# Patient Record
Sex: Male | Born: 1955
Health system: Southern US, Community
[De-identification: ages and names within clinical notes are randomized; demographics above are authoritative.]

## PROBLEM LIST (undated history)

## (undated) ENCOUNTER — Ambulatory Visit: Discharge status: Absent without leave | Payer: BC Managed Care – PPO

## (undated) DIAGNOSIS — G709 Myoneural disorder, unspecified: Secondary | ICD-10-CM

## (undated) DIAGNOSIS — G4730 Sleep apnea, unspecified: Secondary | ICD-10-CM

## (undated) DIAGNOSIS — M1990 Unspecified osteoarthritis, unspecified site: Secondary | ICD-10-CM

## (undated) DIAGNOSIS — K219 Gastro-esophageal reflux disease without esophagitis: Secondary | ICD-10-CM

## (undated) DIAGNOSIS — G4733 Obstructive sleep apnea (adult) (pediatric): Secondary | ICD-10-CM

## (undated) DIAGNOSIS — R000 Tachycardia, unspecified: Secondary | ICD-10-CM

## (undated) DIAGNOSIS — K5730 Diverticulosis of large intestine without perforation or abscess without bleeding: Secondary | ICD-10-CM

## (undated) DIAGNOSIS — E785 Hyperlipidemia, unspecified: Secondary | ICD-10-CM

## (undated) DIAGNOSIS — R251 Tremor, unspecified: Secondary | ICD-10-CM

## (undated) DIAGNOSIS — T7840XA Allergy, unspecified, initial encounter: Secondary | ICD-10-CM

## (undated) DIAGNOSIS — H269 Unspecified cataract: Secondary | ICD-10-CM

## (undated) DIAGNOSIS — F419 Anxiety disorder, unspecified: Secondary | ICD-10-CM

## (undated) DIAGNOSIS — J189 Pneumonia, unspecified organism: Secondary | ICD-10-CM

## (undated) DIAGNOSIS — J449 Chronic obstructive pulmonary disease, unspecified: Secondary | ICD-10-CM

## (undated) DIAGNOSIS — I10 Essential (primary) hypertension: Secondary | ICD-10-CM

## (undated) HISTORY — DX: Diverticulosis of large intestine without perforation or abscess without bleeding: K57.30

## (undated) HISTORY — DX: Obstructive sleep apnea (adult) (pediatric): G47.33

## (undated) HISTORY — DX: Tremor, unspecified: R25.1

## (undated) HISTORY — DX: Unspecified cataract: H26.9

## (undated) HISTORY — DX: Allergy, unspecified, initial encounter: T78.40XA

## (undated) HISTORY — DX: Tachycardia, unspecified: R00.0

## (undated) HISTORY — DX: Gastro-esophageal reflux disease without esophagitis: K21.9

## (undated) HISTORY — DX: Hyperlipidemia, unspecified: E78.5

## (undated) HISTORY — DX: Unspecified osteoarthritis, unspecified site: M19.90

## (undated) HISTORY — DX: Essential (primary) hypertension: I10

## (undated) HISTORY — DX: Sleep apnea, unspecified: G47.30

## (undated) HISTORY — DX: Pneumonia, unspecified organism: J18.9

## (undated) HISTORY — DX: Myoneural disorder, unspecified: G70.9

## (undated) HISTORY — PX: POLYPECTOMY: SHX149

## (undated) HISTORY — DX: Anxiety disorder, unspecified: F41.9

## (undated) HISTORY — DX: Chronic obstructive pulmonary disease, unspecified: J44.9

---

## 1981-02-08 HISTORY — PX: RHINOPLASTY: SUR1284

## 1986-02-08 DIAGNOSIS — J189 Pneumonia, unspecified organism: Secondary | ICD-10-CM

## 1986-02-08 HISTORY — DX: Pneumonia, unspecified organism: J18.9

## 1995-02-09 HISTORY — PX: NISSEN FUNDOPLICATION: SHX2091

## 2001-02-08 DIAGNOSIS — R000 Tachycardia, unspecified: Secondary | ICD-10-CM

## 2001-02-08 HISTORY — DX: Tachycardia, unspecified: R00.0

## 2001-06-22 ENCOUNTER — Observation Stay (HOSPITAL_COMMUNITY): Admission: EM | Admit: 2001-06-22 | Discharge: 2001-06-23 | Payer: Self-pay | Admitting: Internal Medicine

## 2002-08-26 ENCOUNTER — Encounter: Payer: Self-pay | Admitting: Pulmonary Disease

## 2002-08-26 ENCOUNTER — Ambulatory Visit (HOSPITAL_BASED_OUTPATIENT_CLINIC_OR_DEPARTMENT_OTHER): Admission: RE | Admit: 2002-08-26 | Discharge: 2002-08-26 | Payer: Self-pay | Admitting: Internal Medicine

## 2002-11-04 ENCOUNTER — Ambulatory Visit (HOSPITAL_BASED_OUTPATIENT_CLINIC_OR_DEPARTMENT_OTHER): Admission: RE | Admit: 2002-11-04 | Discharge: 2002-11-04 | Payer: Self-pay | Admitting: Pulmonary Disease

## 2002-11-04 ENCOUNTER — Encounter: Payer: Self-pay | Admitting: Pulmonary Disease

## 2003-04-15 ENCOUNTER — Encounter: Payer: Self-pay | Admitting: Internal Medicine

## 2003-07-10 HISTORY — PX: OTHER SURGICAL HISTORY: SHX169

## 2003-07-26 ENCOUNTER — Ambulatory Visit (HOSPITAL_BASED_OUTPATIENT_CLINIC_OR_DEPARTMENT_OTHER): Admission: RE | Admit: 2003-07-26 | Discharge: 2003-07-26 | Payer: Self-pay | Admitting: Plastic Surgery

## 2003-07-26 ENCOUNTER — Ambulatory Visit (HOSPITAL_COMMUNITY): Admission: RE | Admit: 2003-07-26 | Discharge: 2003-07-26 | Payer: Self-pay | Admitting: Plastic Surgery

## 2003-07-26 ENCOUNTER — Encounter (INDEPENDENT_AMBULATORY_CARE_PROVIDER_SITE_OTHER): Payer: Self-pay | Admitting: Specialist

## 2004-01-30 ENCOUNTER — Ambulatory Visit: Payer: Self-pay | Admitting: Family Medicine

## 2005-02-03 ENCOUNTER — Ambulatory Visit: Payer: Self-pay | Admitting: Internal Medicine

## 2005-03-01 ENCOUNTER — Ambulatory Visit: Payer: Self-pay | Admitting: Gastroenterology

## 2005-03-11 HISTORY — PX: OTHER SURGICAL HISTORY: SHX169

## 2005-03-11 HISTORY — PX: COLONOSCOPY: SHX174

## 2005-03-18 ENCOUNTER — Ambulatory Visit: Payer: Self-pay | Admitting: Gastroenterology

## 2005-04-07 ENCOUNTER — Ambulatory Visit: Payer: Self-pay | Admitting: Internal Medicine

## 2005-07-09 ENCOUNTER — Ambulatory Visit: Payer: Self-pay | Admitting: Internal Medicine

## 2006-02-23 ENCOUNTER — Ambulatory Visit: Payer: Self-pay | Admitting: Internal Medicine

## 2006-02-23 LAB — CONVERTED CEMR LAB
BUN: 8 mg/dL (ref 6–23)
Basophils Absolute: 0 10*3/uL (ref 0.0–0.1)
CO2: 29 meq/L (ref 19–32)
Calcium: 9.6 mg/dL (ref 8.4–10.5)
Chloride: 103 meq/L (ref 96–112)
Cholesterol: 150 mg/dL (ref 0–200)
Creatinine, Ser: 1.1 mg/dL (ref 0.4–1.5)
Eosinophils Relative: 1.9 % (ref 0.0–5.0)
HDL: 50.2 mg/dL (ref >39.0)
Hemoglobin: 16.4 g/dL (ref 13.0–17.0)
Lymphocytes Relative: 27.1 % (ref 12.0–46.0)
MCHC: 32.8 g/dL (ref 30.0–36.0)
Monocytes Relative: 11.5 % — ABNORMAL HIGH (ref 3.0–11.0)
Neutro Abs: 2.9 10*3/uL (ref 1.4–7.7)
Neutrophils Relative %: 58.5 % (ref 43.0–77.0)
PSA: 0.25 ng/mL (ref 0.10–4.00)
TSH: 1.76 microintl units/mL (ref 0.35–5.50)
Total CHOL/HDL Ratio: 3
Triglycerides: 73 mg/dL (ref 0–149)

## 2007-01-02 ENCOUNTER — Telehealth (INDEPENDENT_AMBULATORY_CARE_PROVIDER_SITE_OTHER): Payer: Self-pay | Admitting: *Deleted

## 2007-01-10 ENCOUNTER — Ambulatory Visit: Payer: Self-pay | Admitting: Internal Medicine

## 2007-01-10 LAB — CONVERTED CEMR LAB
Albumin: 4.3 g/dL (ref 3.5–5.2)
BUN: 10 mg/dL (ref 6–23)
Bilirubin, Direct: 0.1 mg/dL (ref 0.0–0.3)
HDL: 45.6 mg/dL (ref >39.0)
Hgb A1c MFr Bld: 5.7 % (ref 4.6–6.0)
PSA: 0.31 ng/mL (ref 0.10–4.00)
Total Bilirubin: 0.9 mg/dL (ref 0.3–1.2)
Total CHOL/HDL Ratio: 3.5
Triglycerides: 82 mg/dL (ref 0–149)
VLDL: 16 mg/dL (ref 0–40)

## 2007-01-17 ENCOUNTER — Ambulatory Visit: Payer: Self-pay | Admitting: Internal Medicine

## 2007-01-17 DIAGNOSIS — J309 Allergic rhinitis, unspecified: Secondary | ICD-10-CM | POA: Insufficient documentation

## 2007-01-17 DIAGNOSIS — E785 Hyperlipidemia, unspecified: Secondary | ICD-10-CM

## 2007-01-17 DIAGNOSIS — J45909 Unspecified asthma, uncomplicated: Secondary | ICD-10-CM | POA: Insufficient documentation

## 2007-01-17 LAB — CONVERTED CEMR LAB: HDL goal, serum: 40 mg/dL

## 2007-05-29 ENCOUNTER — Encounter: Payer: Self-pay | Admitting: Internal Medicine

## 2008-02-14 ENCOUNTER — Telehealth (INDEPENDENT_AMBULATORY_CARE_PROVIDER_SITE_OTHER): Payer: Self-pay | Admitting: *Deleted

## 2008-04-22 ENCOUNTER — Telehealth (INDEPENDENT_AMBULATORY_CARE_PROVIDER_SITE_OTHER): Payer: Self-pay | Admitting: *Deleted

## 2008-05-15 ENCOUNTER — Ambulatory Visit: Payer: Self-pay | Admitting: Internal Medicine

## 2008-05-15 DIAGNOSIS — L2089 Other atopic dermatitis: Secondary | ICD-10-CM | POA: Insufficient documentation

## 2008-05-15 DIAGNOSIS — K5730 Diverticulosis of large intestine without perforation or abscess without bleeding: Secondary | ICD-10-CM | POA: Insufficient documentation

## 2008-05-20 ENCOUNTER — Encounter (INDEPENDENT_AMBULATORY_CARE_PROVIDER_SITE_OTHER): Payer: Self-pay | Admitting: *Deleted

## 2008-05-27 ENCOUNTER — Encounter: Payer: Self-pay | Admitting: Internal Medicine

## 2009-01-17 ENCOUNTER — Encounter: Payer: Self-pay | Admitting: Internal Medicine

## 2009-05-23 ENCOUNTER — Telehealth (INDEPENDENT_AMBULATORY_CARE_PROVIDER_SITE_OTHER): Payer: Self-pay | Admitting: *Deleted

## 2009-07-18 ENCOUNTER — Encounter: Payer: Self-pay | Admitting: Internal Medicine

## 2009-10-18 ENCOUNTER — Emergency Department (HOSPITAL_COMMUNITY): Admission: EM | Admit: 2009-10-18 | Discharge: 2009-10-18 | Payer: Self-pay | Admitting: Emergency Medicine

## 2009-10-28 ENCOUNTER — Ambulatory Visit: Payer: Self-pay | Admitting: Internal Medicine

## 2009-10-28 DIAGNOSIS — M2550 Pain in unspecified joint: Secondary | ICD-10-CM | POA: Insufficient documentation

## 2009-10-31 ENCOUNTER — Ambulatory Visit: Payer: Self-pay | Admitting: Internal Medicine

## 2009-11-03 LAB — CONVERTED CEMR LAB
AST: 24 units/L (ref 0–37)
BUN: 14 mg/dL (ref 6–23)
Basophils Absolute: 0.1 10*3/uL (ref 0.0–0.1)
Bilirubin, Direct: 0.2 mg/dL (ref 0.0–0.3)
Chloride: 103 meq/L (ref 96–112)
Cholesterol: 157 mg/dL (ref 0–200)
Creatinine, Ser: 1 mg/dL (ref 0.4–1.5)
Eosinophils Absolute: 0.2 10*3/uL (ref 0.0–0.7)
GFR calc non Af Amer: 81.2 mL/min (ref >60)
LDL Cholesterol: 81 mg/dL (ref 0–99)
Lymphocytes Relative: 32.8 % (ref 12.0–46.0)
MCHC: 34.9 g/dL (ref 30.0–36.0)
MCV: 90.4 fL (ref 78.0–100.0)
Monocytes Absolute: 1 10*3/uL (ref 0.1–1.0)
Neutrophils Relative %: 46.7 % (ref 43.0–77.0)
PSA: 0.3 ng/mL (ref 0.10–4.00)
Platelets: 186 10*3/uL (ref 150.0–400.0)
Potassium: 4.5 meq/L (ref 3.5–5.1)
Rhuematoid fact SerPl-aCnc: <20 intl units/mL (ref 0–20)
Sed Rate: 5 mm/hr (ref 0–22)
Total Bilirubin: 0.7 mg/dL (ref 0.3–1.2)
Total CHOL/HDL Ratio: 4
Triglycerides: 164 mg/dL — ABNORMAL HIGH (ref 0.0–149.0)
VLDL: 32.8 mg/dL (ref 0.0–40.0)

## 2009-11-07 ENCOUNTER — Ambulatory Visit: Payer: Self-pay | Admitting: Pulmonary Disease

## 2009-11-07 DIAGNOSIS — G4733 Obstructive sleep apnea (adult) (pediatric): Secondary | ICD-10-CM

## 2009-11-26 ENCOUNTER — Encounter: Payer: Self-pay | Admitting: Pulmonary Disease

## 2009-12-14 ENCOUNTER — Encounter: Payer: Self-pay | Admitting: Pulmonary Disease

## 2009-12-19 ENCOUNTER — Encounter: Payer: Self-pay | Admitting: Pulmonary Disease

## 2010-01-06 ENCOUNTER — Ambulatory Visit: Payer: Self-pay | Admitting: Pulmonary Disease

## 2010-01-14 ENCOUNTER — Encounter: Payer: Self-pay | Admitting: Pulmonary Disease

## 2010-01-19 ENCOUNTER — Encounter: Payer: Self-pay | Admitting: Pulmonary Disease

## 2010-02-18 ENCOUNTER — Encounter: Payer: Self-pay | Admitting: Internal Medicine

## 2010-02-22 ENCOUNTER — Encounter: Payer: Self-pay | Admitting: Pulmonary Disease

## 2010-03-08 LAB — CONVERTED CEMR LAB
ALT: 33 units/L (ref 0–53)
BUN: 10 mg/dL (ref 6–23)
Bilirubin, Direct: 0.1 mg/dL (ref 0.0–0.3)
CO2: 34 meq/L — ABNORMAL HIGH (ref 19–32)
Chloride: 104 meq/L (ref 96–112)
Cholesterol, target level: 200 mg/dL
Cholesterol: 160 mg/dL (ref 0–200)
Creatinine, Ser: 0.9 mg/dL (ref 0.4–1.5)
Eosinophils Absolute: 0.2 10*3/uL (ref 0.0–0.7)
Eosinophils Relative: 3.8 % (ref 0.0–5.0)
Glucose, Bld: 94 mg/dL (ref 70–99)
HCT: 50.4 % (ref 39.0–52.0)
HDL goal, serum: 40 mg/dL
LDL Cholesterol: 92 mg/dL (ref 0–99)
Lymphs Abs: 1.4 10*3/uL (ref 0.7–4.0)
MCHC: 33.8 g/dL (ref 30.0–36.0)
MCV: 90.5 fL (ref 78.0–100.0)
Monocytes Absolute: 0.7 10*3/uL (ref 0.1–1.0)
Neutrophils Relative %: 54.1 % (ref 43.0–77.0)
PSA: 0.27 ng/mL (ref 0.10–4.00)
Platelets: 196 10*3/uL (ref 150.0–400.0)
Potassium: 4.5 meq/L (ref 3.5–5.1)
RDW: 12.5 % (ref 11.5–14.6)
TSH: 1.75 microintl units/mL (ref 0.35–5.50)
Total Bilirubin: 1.1 mg/dL (ref 0.3–1.2)
Triglycerides: 105 mg/dL (ref 0.0–149.0)

## 2010-03-12 NOTE — Assessment & Plan Note (Signed)
Summary: re ass need for equipment/sleep apena/cb   Visit Type:  Initial Consult Copy to:    Primary Shantoya Geurts/Referring Tynia Wiers:  Dr. Marga Melnick  CC:  Sleep consult...epworth score is 8...former Dr. Shelle Iron patient.  History of Present Illness: 55 year old male for evaluation of obstructive sleep apnea.   He was diagnosed with sleep apnea from sleep study on August 26, 2002.  This showed severe sleep apnea with AHI 59.  He had a CPAP titration from November 04, 2002, and was started on CPAP at 9 cm H2O.  He was being seen by Dr. Marcelyn Bruins, but was lost to follow up.  He stopped using CPAP several months ago.  He has since started getting more trouble with his breathing while asleep.  He was having trouble using his CPAP.  He did not like the mask.  He would get a runny nose, and the mask would not stay in place.  He has not had contact with his DME since about 2005.  He has been using the same equipment, and has been on the same pressure setting.  He has been feeling more sluggish during the day.  He will occasionally get headaches in the morning.  He has started snoring again.  He was not snoring when he was using CPAP.  He goes to bed at midnight, and falls asleep quick.  He is not using anything to help him sleep.  He sleeps through the night, and gets up at 830 am.  He will take a nap in the mid-morning.  He does not use anything to help stay awake.  He will occasional talk in his sleep.  He denies sleep walking, bruxism, or nightmares.  There is no history of restless legs.  He denies sleep hallucinations, sleep paralysis, or cataplexy.  His weight has been steady.  He denies thyroid disease.  He is not having any problem with his mood.  Preventive Screening-Counseling & Management  Alcohol-Tobacco     Alcohol drinks/day: <1     Smoking Status: never  Current Medications (verified): 1)  Vytorin 10-40 Mg  Tabs (Ezetimibe-Simvastatin) .Marland Kitchen.. 1 At Bedtime 2)  Singulair 10 Mg  Tabs  (Montelukast Sodium) .... Take One Tablet By Mouth Once A Day 3)  Advair Diskus 250-50 Mcg/dose Aepb (Fluticasone-Salmeterol) .... Use As Directed 4)  Levocetirizine Dihydrochloride 5 Mg Tabs (Levocetirizine Dihydrochloride) .... Take 1 Tab Once Daily 5)  Desoximetasone 0.05 % Gel (Desoximetasone) .... Apply Once Daily As Needed 6)  Allergy Shots .... Once A Week  Allergies (verified): No Known Drug Allergies  Past History:  Past Medical History: Hyperlipidemia: NMR Lipoprofile 2005: LDL 811(9147/829), HDL 44, TG 56. LDL goal = < 110. Framingham Study LDL goal = < 130. Sleep Apnea; CPAP( used as needed for severe snoring)      - PSG 08/26/02 AHI 59 Allergic rhinitis, on shots, Dr Baxter Callas Asthma Diverticulosis, colon, due 2014  Past Surgical History: Reviewed history from 10/28/2009 and no changes required. REFLUX SURGERY (1997) RHINOPLASTY (1983) PNEUMONIA (OP) 1989 TACHYCARDIA (06/2001), Dr.Thomas  Brackbill, negative cardiac workup Lesion removed L neck; pigmented mole @ hairline , non melanoma (OP) 07/2003 COLON TICS (03/2005)  Family History: Reviewed history from 10/28/2009 and no changes required. Father: MI DIED @ 94 Mother: DM, HTN, CVA, ASTHMA, CHF Siblings: SISTER:DM, ALLERGIES                BROTHER:HTN, DM; BRO: MI @ 45(smoker)  7 SIBLINGS; 3 with  ASTHMA &  2  with DM  Social History: Reviewed history from 10/28/2009 and no changes required. No diet Retired Married Never Smoked Alcohol use-yes: occasionally  Review of Systems       The patient complains of shortness of breath with activity, productive cough, non-productive cough, joint stiffness or pain, and rash.  The patient denies shortness of breath at rest, coughing up blood, chest pain, irregular heartbeats, acid heartburn, indigestion, loss of appetite, weight change, abdominal pain, difficulty swallowing, sore throat, tooth/dental problems, headaches, nasal congestion/difficulty breathing through nose,  sneezing, itching, ear ache, anxiety, depression, hand/feet swelling, change in color of mucus, and fever.    Vital Signs:  Patient profile:   55 year old male Height:      69.5 inches (176.53 cm) Weight:      202.50 pounds (92.05 kg) BMI:     29.58 O2 Sat:      96 % on Room air Temp:     98.0 degrees F (36.67 degrees C) oral Pulse rate:   95 / minute BP sitting:   124 / 76  (left arm) Cuff size:   large  Vitals Entered By: Michel Bickers CMA (November 07, 2009 2:47 PM)  O2 Sat at Rest %:  96 O2 Flow:  Room air CC: Sleep consult...epworth score is 8...former Dr. Shelle Iron patient Comments Medications reviewed with patient Daytime phone verified. Michel Bickers [facility]  November 07, 2009 2:59 PM   Physical Exam  General:  normal appearance and healthy appearing.   Eyes:  PERRLA and EOMI.   Nose:  no deformity, discharge, inflammation, or lesions Mouth:  MP 2, retrognathic, high arched palate Neck:  no JVD.   Chest Wall:  no deformities noted Lungs:  clear bilaterally to auscultation and percussion Heart:  regular rate and rhythm, S1, S2 without murmurs, rubs, gallops, or clicks Abdomen:  bowel sounds positive; abdomen soft and non-tender without masses, or organomegaly Pulses:  pulses normal Extremities:  no clubbing, cyanosis, edema, or deformity noted Neurologic:  normal CN II-XII and strength normal.   Cervical Nodes:  no significant adenopathy Psych:  alert and cooperative; normal mood and affect; normal attention span and concentration   Impression & Recommendations:  Problem # 1:  OBSTRUCTIVE SLEEP APNEA (ICD-327.23) Reviewed his sleep study results.  Explained how sleep apnea can affect his health.  Driving precautions, and importance of maintaining his weight were reviewed.  Discussed treatment options.    Will arrange for him to get a new CPAP machine.  Will arrange for auto-CPAP titration.  Explained that if this is unsuccessful, then he may need repeat in-lab titration.   Will also have his mask fit re-assessed.    If he is not able to tolerate CPAP, he may be a candidate for an oral appliance.  Medications Added to Medication List This Visit: 1)  Allergy Shots  .... Once a week  Complete Medication List: 1)  Vytorin 10-40 Mg Tabs (Ezetimibe-simvastatin) .Marland Kitchen.. 1 at bedtime 2)  Singulair 10 Mg Tabs (Montelukast sodium) .... Take one tablet by mouth once a day 3)  Advair Diskus 250-50 Mcg/dose Aepb (Fluticasone-salmeterol) .... Use as directed 4)  Levocetirizine Dihydrochloride 5 Mg Tabs (Levocetirizine dihydrochloride) .... Take 1 tab once daily 5)  Desoximetasone 0.05 % Gel (Desoximetasone) .... Apply once daily as needed 6)  Allergy Shots  .... Once a week  Other Orders: Consultation Level IV (16109) DME Referral (DME)  Patient Instructions: 1)  Will set up new CPAP machine  2)  Follow up in 6  to 8 weeks

## 2010-03-12 NOTE — Miscellaneous (Signed)
Summary: CPAP download 12/02/09 to 01/14/10   Clinical Lists Changes CPAP 7 cm.  Average 5hr 46 min use with average AHI 3.2.    Will have my nurse contact pt to inform that CPAP report looks good.  No change to current set up.  Appended Document: CPAP download 12/02/09 to 01/14/10 Pt is aware CPAP report looked good per Dr. Craige Cotta and no changes to current set up.

## 2010-03-12 NOTE — Assessment & Plan Note (Signed)
Summary: med refill/cbs   Vital Signs:  Patient profile:   55 year old male Height:      69.5 inches Weight:      202 pounds O2 Sat:      98 % on Room air Temp:     98.1 degrees F oral Pulse rate:   90 / minute Resp:     16 per minute BP sitting:   130 / 90  (left arm)  Vitals Entered By: Jeremy Johann CMA (October 28, 2009 12:58 PM)  O2 Flow:  Room air    History of Present Illness: Kenneth Sellers is here for a physical; he is asymptomatic except for some arthralgias & eczema.                        He  also presents for Hyperlipidemia follow-up.  The patient reports itching(related to his eczema), but denies muscle aches, GI upset, abdominal pain, flushing, constipation, diarrhea, and fatigue.  The patient denies the following symptoms: chest pain/pressure, exercise intolerance, dypsnea, palpitations, syncope, and pedal edema.  Compliance with medications (by patient report) has been near 100%.  Dietary compliance has been fair.  The patient reports no exercise.    Lipid Management History:      Positive NCEP/ATP III risk factors include male age 63 years old or older and family history for ischemic heart disease (males less than 64 years old).  Negative NCEP/ATP III risk factors include non-diabetic, non-tobacco-user status, non-hypertensive, no ASHD (atherosclerotic heart disease), no prior stroke/TIA, no peripheral vascular disease, and no history of aortic aneurysm.     Preventive Screening-Counseling & Management  Alcohol-Tobacco     Smoking Status: never  Current Medications (verified): 1)  Vytorin 10-40 Mg  Tabs (Ezetimibe-Simvastatin) .... **appointment Due**take One Tablet Daily, 2)  Singulair 10 Mg  Tabs (Montelukast Sodium) .... Take One Tablet By Mouth Once A Day 3)  Advair Diskus 250-50 Mcg/dose Aepb (Fluticasone-Salmeterol) .... Use As Directed 4)  Levocetirizine Dihydrochloride 5 Mg Tabs (Levocetirizine Dihydrochloride) .... Take 1 Tab Once Daily 5)  Desoximetasone  0.05 % Gel (Desoximetasone) .... Apply Once Daily As Needed **appointment Needed**  Allergies (verified): No Known Drug Allergies  Past History:  Past Medical History: Hyperlipidemia: NMR Lipoprofile 2005: LDL 161(0960/454), HDL 44, TG 56. LDL goal = < 110. Framingham Study LDL goal = < 130. Sleep Apnea; CPAP( used as needed for severe snoring) Allergic rhinitis, on shots, Dr Superior Callas Asthma Diverticulosis, colon, due 2014  Past Surgical History: REFLUX SURGERY (1997) RHINOPLASTY (1983) PNEUMONIA (OP) 1989 TACHYCARDIA (06/2001), Dr.Thomas  Brackbill, negative cardiac workup Lesion removed L neck; pigmented mole @ hairline , non melanoma (OP) 07/2003 COLON TICS (03/2005)  Family History: Father: MI DIED @ 55 Mother: DM, HTN, CVA, ASTHMA, CHF Siblings: SISTER:DM, ALLERGIES                BROTHER:HTN, DM; BRO: MI @ 45(smoker)  7 SIBLINGS; 3 with  ASTHMA &  2 with DM  Social History: No diet Retired Married Never Smoked Alcohol use-yes: occasionally  Review of Systems General:  Complains of sleep disorder; denies fatigue; CPAP rarely used due to discomfort; machine is > 7 yrs old. Eyes:  Denies blurring, double vision, and vision loss-both eyes. ENT:  Denies difficulty swallowing and hoarseness; Throat congestion in am. CV:  Denies difficulty breathing at night and difficulty breathing while lying down; No PNDyspnea. Resp:  Complains of cough and excessive snoring; denies hypersomnolence, morning headaches, and wheezing;  Rescue MDI rarely used, mainly in Winter.Marland Kitchen GI:  Denies abdominal pain, bloody stools, dark tarry stools, and indigestion. GU:  Denies discharge, dysuria, and hematuria. MS:  Complains of joint pain, joint redness, and joint swelling; denies low back pain, mid back pain, and thoracic pain; Arthralgias of thumbs & index fingers; Rx: NSAIDS . Derm:  Complains of changes in nail beds; denies dryness, hair loss, lesion(s), and rash; Ridging on nails. Neuro:  Denies  numbness and tingling. Psych:  Denies anxiety and depression. Endo:  Denies cold intolerance, excessive hunger, excessive thirst, excessive urination, and heat intolerance. Heme:  Denies abnormal bruising and bleeding. Allergy:  Complains of itching eyes, seasonal allergies, and sneezing; On Allergy shots weekly from Dr Lovejoy Callas . These have been beneficial..  Physical Exam  General:  well-nourished; alert,appropriate and cooperative throughout examination Head:  Normocephalic and atraumatic without obvious abnormalities. No apparent alopecia  Eyes:  No corneal or conjunctival inflammation noted.Perrla. Funduscopic exam benign, without hemorrhages, exudates or papilledema.  Ears:  External ear exam shows no significant lesions or deformities.  Otoscopic examination reveals clear canals, tympanic membranes are intact bilaterally without bulging, retraction, inflammation or discharge. Hearing is grossly normal bilaterally. Nose:  External nasal examination shows no deformity or inflammation. Nasal mucosa are pink and moist without lesions or exudates. Mouth:  Oral mucosa and oropharynx without lesions or exudates.  Teeth in good repair. Neck:  No deformities, masses, or tenderness noted. Lungs:  Normal respiratory effort, chest expands symmetrically. Lungs are clear to auscultation, no crackles or wheezes. Heart:  Normal rate and regular rhythm. S1 and S2 normal without gallop, murmur, click, rub or other extra sounds. Abdomen:  Bowel sounds positive,abdomen soft and non-tender without masses, organomegaly or hernias noted. Mid line op scar Rectal:  No external abnormalities noted. Normal sphincter tone. No rectal masses or tenderness. Genitalia:  Testes bilaterally descended without nodularity, tenderness or masses. No scrotal masses or lesions. No penis lesions or urethral discharge. L varicocele.   Prostate:  Prostate gland firm and smooth, no enlargement, nodularity, tenderness, mass, asymmetry  or induration. Msk:  No deformity or scoliosis noted of thoracic or lumbar spine.   Pulses:  R and L carotid,radial,dorsalis pedis and posterior tibial pulses are full and equal bilaterally Extremities:  No clubbing, cyanosis, edema. Minor DIP changes. 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   Skin:  Intact without suspicious lesions or rashes10 X 5 mm cystic granuloma L posterior axillary line Cervical Nodes:  No lymphadenopathy noted Axillary Nodes:  No palpable lymphadenopathy Inguinal Nodes:  No significant adenopathy Psych:  memory intact for recent and remote, normally interactive, and good eye contact.     Impression & Recommendations:  Problem # 1:  ROUTINE GENERAL MEDICAL EXAM@HEALTH  CARE FACL (ICD-V70.0)  Orders: EKG w/ Interpretation (93000): P changes which may be related to Sleep Apnea sequellae(Note : asthma clinically quiescent)  Problem # 2:  ARTHRALGIA (ICD-719.40)  Problem # 3:  HYPERLIPIDEMIA (ICD-272.2)  His updated medication list for this problem includes:    Desoximetasone 0.05 % Gel (Desoximetasone) .Marland Kitchen... Apply once daily as needed   Problem # 4:  ASTHMA (ICD-493.90) Quiescent His updated medication list for this problem includes:    Singulair 10 Mg Tabs (Montelukast sodium) .Marland Kitchen... Take one tablet by mouth once a day    Advair Diskus 250-50 Mcg/dose Aepb (Fluticasone-salmeterol) ..... Use as directed  Problem # 5:  UNSPECIFIED SLEEP APNEA (ICD-780.57)  non tolerant of equipment; reassessment indicated in context of EKG changes  Orders: Sleep Disorder Referral (Sleep Disorder)  Problem # 6:  ECZEMA, ATOPIC (ICD-691.8)  His updated medication list for this problem includes:    Desoximetasone 0.05 % Gel (Desoximetasone) .Marland Kitchen... Apply once daily as needed  Complete Medication List: 1)  Vytorin 10-40 Mg Tabs (Ezetimibe-simvastatin) .Marland Kitchen.. 1 at bedtime 2)  Singulair 10 Mg Tabs  (Montelukast sodium) .... Take one tablet by mouth once a day 3)  Advair Diskus 250-50 Mcg/dose Aepb (Fluticasone-salmeterol) .... Use as directed 4)  Levocetirizine Dihydrochloride 5 Mg Tabs (Levocetirizine dihydrochloride) .... Take 1 tab once daily 5)  Desoximetasone 0.05 % Gel (Desoximetasone) .... Apply once daily as needed  Other Orders: Admin 1st Vaccine (98119) Flu Vaccine 53yrs + (14782)  Lipid Assessment/Plan:      Based on NCEP/ATP III, the patient's risk factor category is "0-1 risk factors".  The patient's lipid goals are as follows: Total cholesterol goal is 200; LDL cholesterol goal is 110; HDL cholesterol goal is 40; Triglyceride goal is 150.  His LDL cholesterol goal has been met.    Patient Instructions: 1)  Please complete Sleep Apnea reassessment. Schedule fasting labs; see Diagnoses for Codes:Sed rate; RA factor; 2)  BMP; 3)  Hepatic Panel; 4)  Lipid Panel ; 5)  TSH ; 6)  CBC w/ Diff; 7)  PSA . Prescriptions: DESOXIMETASONE 0.05 % GEL (DESOXIMETASONE) apply once daily as needed  #60 grams x 5   Entered and Authorized by:   Marga Melnick MD   Signed by:   Marga Melnick MD on 10/28/2009   Method used:   Faxed to ...       [facility]* (retail)       [address]       [address] Shipping/mailing       [place], [place]  [address]       Ph: [phone]       Fax: [phone]   RxID:   [id number] VYTORIN 10-40 MG  TABS (EZETIMIBE-SIMVASTATIN) 1 at bedtime  #90 x 3   Entered and Authorized by:   Marga Melnick MD   Signed by:   Marga Melnick MD on 10/28/2009   Method used:   Faxed to ...       [facility]* (retail)       [address]       [address] Shipping/mailing       [place], [place]  [address]       Ph: [phone]       Fax: [phone]   RxID:   [id number]  Flu Vaccine Consent Questions     Do you have a history of severe allergic reactions to this vaccine? no    Any prior history of  allergic reactions to egg and/or gelatin? no    Do you have a sensitivity to the preservative Thimersol? no    Do you have a past history of Guillan-Barre Syndrome? no    Do you currently have an acute febrile illness? no    Have you ever had a severe reaction to latex? no    Vaccine information given and explained to patient? yes    Are you currently pregnant? no    Lot Number:[id number]   Exp Date:08/08/2010   Site Given  Left Deltoid IMlu

## 2010-03-12 NOTE — Letter (Signed)
Summary: [facility]  [facility]   Imported By: Lanelle Bal 02/20/2009 09:37:04  _____________________________________________________________________  External Attachment:    Type:   Image     Comment:   External Document

## 2010-03-12 NOTE — Assessment & Plan Note (Signed)
Summary: 8 weeks/ mbw   Visit Type:  Follow-up Copy to:  [facility]etter[facility]/Referring Eyvette Cordon:  Dr. Marga Melnick  CC:  CPAP follow-up...pt doing better with nasal pillows...sleeping 6-7 hours every night...he does c/o chest tightness x2 weeks and cough w/ white mucus.  History of Present Illness: 55 yo male with OSA on CPAP 7 cm.  He has been doing better with CPAP.  He goes to bed at midnight, and wakes up at 8am.  He feels okay in the morning, and denies headaches.  He will occasionally get sleepy in the late afternoon, but not too bad.  He uses his CPAP every night.  He has nasal pillows, and no problem with his mask.  He has some construction going on at his home.  With this he has noticed some more trouble with his breathing and cough.  He is bringing up clear sputum.  He denies fever, chest pain, or hemoptysis.  He does not typically wheeze.  He has been using proair two times a day, and this helps.    Current Medications (verified): 1)  Vytorin 10-40 Mg  Tabs (Ezetimibe-Simvastatin) .Marland Kitchen.. 1 At Bedtime 2)  Singulair 10 Mg  Tabs (Montelukast Sodium) .... Take One Tablet By Mouth Once A Day 3)  Advair Diskus 250-50 Mcg/dose Aepb (Fluticasone-Salmeterol) .... Use As Directed 4)  Levocetirizine Dihydrochloride 5 Mg Tabs (Levocetirizine Dihydrochloride) .... Take 1 Tab Once Daily 5)  Desoximetasone 0.05 % Gel (Desoximetasone) .... Apply Once Daily As Needed 6)  Allergy Shots .... Once A Week  Allergies (verified): No Known Drug Allergies  Past History:  Past Medical History: Hyperlipidemia: NMR Lipoprofile 2005: LDL 161(0960/454), HDL 44, TG 56. LDL goal = < 110. Framingham Study LDL goal = < 130. Obstructive sleep apnea      - PSG 08/26/02 AHI 59      - CPAP 7 cm Allergic rhinitis, on shots, Dr Quamba Callas Asthma Diverticulosis, colon, due 2014  Past Surgical History: Reviewed history from 10/28/2009 and no changes required. REFLUX SURGERY (1997) RHINOPLASTY  (1983) PNEUMONIA (OP) 1989 TACHYCARDIA (06/2001), Dr.Thomas  Brackbill, negative cardiac workup Lesion removed L neck; pigmented mole @ hairline , non melanoma (OP) 07/2003 COLON TICS (03/2005)  Vital Signs:  Patient profile:   55 year old male Height:      69.5 inches (176.53 cm) Weight:      207.25 pounds (94.20 kg) BMI:     30.28 O2 Sat:      95 % on Room air Temp:     98.0 degrees F (36.67 degrees C) oral Pulse rate:   101 / minute BP sitting:   142 / 80  (left arm) Cuff size:   regular  Vitals Entered By: Michel Bickers CMA (January 06, 2010 2:47 PM)  O2 Sat at Rest %:  95 O2 Flow:  Room air CC: CPAP follow-up...pt doing better with nasal pillows...sleeping 6-7 hours every night...he does c/o chest tightness x2 weeks and cough w/ white mucus Comments Medications reviewed with patient Michel Bickers CMA  January 06, 2010 2:48 PM   Physical Exam  General:  normal appearance and healthy appearing.   Nose:  no deformity, discharge, inflammation, or lesions Mouth:  MP 2, retrognathic, high arched palate Neck:  no JVD.   Lungs:  clear bilaterally to auscultation and percussion Heart:  regular rate and rhythm, S1, S2 without murmurs, rubs, gallops, or clicks Extremities:  no clubbing, cyanosis, edema, or deformity noted Neurologic:  normal CN II-XII and strength normal.  Cervical Nodes:  no significant adenopathy Psych:  alert and cooperative; normal mood and affect; normal attention span and concentration   Impression & Recommendations:  Problem # 1:  OBSTRUCTIVE SLEEP APNEA (ICD-327.23)  He has done well with CPAP 7 cm.  Encouraged him to maintain his compliance.  Problem # 2:  ASTHMA (ICD-493.90) He has recent worsening of his symptoms after being exposed to dust from construction at his home.  I don't think he needs antibiotics, prednisone, or chest xray at present.  He is to continue on advair, singulair, and as needed proair.  Advised him to follow up with primary care  and allergist, but pulmonary can be available as needed if further assistance is needed.  Medications Added to Medication List This Visit: 1)  Proair Hfa 108 (90 Base) Mcg/act Aers (Albuterol sulfate) .... Two puffs as needed  Complete Medication List: 1)  Vytorin 10-40 Mg Tabs (Ezetimibe-simvastatin) .Marland Kitchen.. 1 at bedtime 2)  Singulair 10 Mg Tabs (Montelukast sodium) .... Take one tablet by mouth once a day 3)  Advair Diskus 250-50 Mcg/dose Aepb (Fluticasone-salmeterol) .... Use as directed 4)  Proair Hfa 108 (90 Base) Mcg/act Aers (Albuterol sulfate) .... Two puffs as needed 5)  Levocetirizine Dihydrochloride 5 Mg Tabs (Levocetirizine dihydrochloride) .... Take 1 tab once daily 6)  Desoximetasone 0.05 % Gel (Desoximetasone) .... Apply once daily as needed 7)  Allergy Shots  .... Once a week  Other Orders: Est. Patient Level IV (16109)  Patient Instructions: 1)  Follow up in 6 months

## 2010-03-12 NOTE — Letter (Signed)
Summary: [facility]   [facility]   Imported By: Lanelle Bal 08/07/2009 13:54:03  _____________________________________________________________________  External Attachment:    Type:   Image     Comment:   External Document

## 2010-03-12 NOTE — Miscellaneous (Signed)
Summary: CPAP download 12/02/09 to 12/14/09   Clinical Lists Changes Optimal pressure 7cm H2O with average AHI 5.  Used average 6hrs 32 min.  Will have my nurse call to inform patient that CPAP report looked good.  Will set pressure at 7 cm H2O.  Will discuss further at next ROV. Orders: Added new Referral order of DME Referral (DME) - Signed  Appended Document: CPAP download 12/02/09 to 12/14/09 The patient is aware cpap download looked good and pressure will be set at 7cm. Patient will call if any questions or problems before next OV at the end of Nov 2011.

## 2010-03-12 NOTE — Progress Notes (Signed)
Summary: refill  Phone Note Refill Request Message from:  Fax from Pharmacy on May 23, 2009 10:59 AM  Refills Requested: Medication #1:  DESOXIMETASONE 0.05 % GEL apply once daily as needed. [place] [facility] [address] fax [phone]   Method Requested: Fax to Local Pharmacy Next Appointment Scheduled: no appt Initial call taken by: Barb Merino,  May 23, 2009 11:00 AM    New/Updated Medications: DESOXIMETASONE 0.05 % GEL (DESOXIMETASONE) apply once daily as needed **APPOINTMENT NEEDED** Prescriptions: DESOXIMETASONE 0.05 % GEL (DESOXIMETASONE) apply once daily as needed **APPOINTMENT NEEDED**  #90Grams x 0   Entered by:   Shonna Chock   Authorized by:   Marga Melnick MD   Signed by:   Shonna Chock on 05/23/2009   Method used:   Electronically to        [facility]* (retail)       [address]       [address] [place] [employer]       [place], [place]  [address]       Ph: [phone]       Fax: [phone]   RxID:   [id number]

## 2010-03-18 NOTE — Letter (Signed)
Summary: [facility]  [facility]   Imported By: Lanelle Bal 03/09/2010 10:35:28  _____________________________________________________________________  External Attachment:    Type:   Image     Comment:   External Document

## 2010-06-26 NOTE — H&P (Signed)
[facility]  Patient:    Kenneth Sellers, BURNSWORTH Visit Number: [id number] MRN: [id number]          Service Type: MED Attending Physician:  Pecola Lawless Md Dictated by:   Titus Dubin. Hopper,M.D. [facility] Admit Date:  06/22/2001 Discharge Date: 06/23/2001   CC:         Colleen Can. Deborah Chalk, M.D.                         History and Physical  HISTORY OF PRESENT ILLNESS:  Thepatient is a 55 year old Caucasian male admitted with tachycardia and chest pain.  The tachycardia was present upon awakening and lasted all day.  It was associated with some chest tightness.  It was also associated with some shortness of breath.  He denied fever, chills, sweats, or nausea.  He did state he had slight sweating which was basically his "normal."  The tachycardia resolved within one to two hours of being seen late Thursday afternoon at 5:15.  He did go by the hospital where hiswife is a Engineer, civil (consulting).  His pulse was found to be 115-120, with a blood pressure of 139/88.  He has recently had an asthma flare as of Jun 15, 2001, in the context of postnasal drainage and hacking cough.  He used Sudafed on Jun 16, 2001, but not since.  All beverages are decaffeinated.  PAST MEDICAL HISTORY: 1. Reflux surgery in 1997.  There were two surgeries.  He had a laparoscopic    procedure, then he had to have an open procedure due to herniation related    to coughing. 2. In 1983 he hada rhinoplasty. 3. In 1989 he was treated as an outpatient for pneumonia. 4. Hypercholesterolemia.  FAMILY HISTORY:  Positive for throat cancer in paternal uncle.  Leukemia in paternal aunt.  Leukemia in maternal grandmother.  Diabetes, hypertension, stroke, asthma, and congestive heart failure in his mother.  Diabetes in a sister.  Hypertension in a brother.  Myocardial infarction in his father at age 3.  Three siblings, two brothers and one sister, have asthma.  His son has allergies.  SOCIAL HISTORY:  He has  never smoked.  He drinks approximately two alcoholic beverages per week.  He does not exercise as he has a very busy work schedule. 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  REVIEW OF SYSTEMS:  Some leg pain which he relates to orthotics.  He does have occasional belching.  The remainder of the review of systems is negative. Specifically, he has no rectal bleeding or melena.  MEDICATIONS: 1. Zocor 80 mgdaily. 2. Flonase intranasally.  ALLERGIES:  No known drug allergies.  PHYSICAL EXAMINATION:  VITAL SIGNS:  Height 5 feet 10 inches, weight 201 pounds.  Temperature 97.9, pulse 92, respiratory rate 15, blood pressure 120/92.  HEENT:  Fundal exam was normal.  Otolaryngologic exam revealed no pathologic findings.  NECK:  Thyroid was normal to palpation.  CHEST:  Clear to auscultation.  HEART: He did exhibit an S4, with no murmurs or gallops.  As noted, rate was 92.  ABDOMEN:  All pulses were intact, and there were no pulse deficits, edema, or aneurysms.  Midline operative scar was present.  No organomegaly or masses.  GENITOURINARY, RECTAL:  Initially deferred as did not pertain to this admission.  NEUROLOGIC:  No localizing neurologic signs.  Neuropsychiatrically, no deficits are noted.  LABORATORY  DATA:  EKG shows a Q-wave inferiorly with no ischemic changes.  PLAN:  He will be admitted to telemetry with cardiac enzymes.  Cardiology consultation will be with Dr. Delfin Edis.  If enzymes are negative during this observation, we would pursue outpatient workup to include stress Cardiolite and possibly a Holter monitor or an event monitor. Dictated by:  Titus Dubin. Alwyn Ren, M.D. [facility] Attending Physician:  Pecola Lawless Md DD:  06/23/01 TD:  06/24/01 Job: [id number]

## 2010-06-26 NOTE — Op Note (Signed)
Kenneth Sellers, Kenneth Sellers                            ACCOUNT NO.:  [id number]   MEDICAL RECORD NO.:  [id number]                   PATIENT TYPE:  AMB   LOCATION:  DSC                                  FACILITY:  [facility]   PHYSICIAN:  Alfredia Ferguson, M.D.               DATE OF BIRTH:  July 27, 1955   DATE OF PROCEDURE:  07/26/2003  DATE OF DISCHARGE:                                 OPERATIVE REPORT   PREOPERATIVE DIAGNOSES:  1. Epidermal inclusion cyst, right lateral neck, 1 cm.  2. A 5 mm pigmented nevus, left temporal scalp above the helix of the ear.   POSTOPERATIVE DIAGNOSIS:  1. Epidermal inclusion cyst, right lateral neck, 1 cm.  2. A 5 mm pigmented nevus, left temporal scalp above the helix of the ear.   OPERATION PERFORMED:  1. Excision of sebaceous cyst, right lateral neck.  2. Excision of pigmented nevus, left temporal area.   SURGEON:  Alfredia Ferguson, M.D.   ANESTHESIA:  2% Xylocaine, 1:100,000 epinephrine.   INDICATIONS FOR PROCEDURE:  The patient is a 55 year old male with a cyst on  his right lateral neck.  It is progressively enlarging.  He wishes to have  it removed before it has the opportunity to get infected.  It is  approximately 1 cm in diameter.  The patient also has a pigmented nevus in  his left temple area which he states becomes irritated every time he combs  his hair.  He would like to have it removed to prevent this irrigation.  The  patient understands that he is trading each of these areas for permanent and  potentially unsightly scar.  He understands the risk of recurrence of  sebaceous cyst.  In spite of that, he wishes to proceed.   DESCRIPTION OF PROCEDURE:  Skin markers were placed around the nevus in the  temporal area after shaving some hair.  Skin markers were placed around the  diseased punctum over the sebaceous cyst.  Local anesthesia using 2%  Xylocaine 1:100,000 epinephrine was infiltrated.  After waiting  approximately 7 minutes, the temple  area was prepped with Betadine and  draped with sterile drapes.  Elliptical excision of the nevus was carried  out and passed off for pathology.  Hemostasis was accomplished using  pressure.  Wound edges were undermined for a distance of several millimeters  in all directions.  The wound was closed using interrupted 4-0 nylon  sutures.  Attention was directed to the right lateral neck.  The area was  prepped with Betadine and draped with sterile drapes.  An elliptical  incision was made through the skin edges.  The incision was deepened until  reaching the cyst wall.  The cyst wall was sharply debrided away from the  surrounding tissues.  Specimen was passed off for pathology.  Hemostasis was  accomplished using pressure.  The wound was closed by approximating the  dermis using multiple interrupted 5-0 Monocryl sutures.  Steri-Strips were  applied to the skin edges.  The patient tolerated the procedure well with  minimal blood loss.  A light dressing was applied to the neck.  The patient  will be followed up in approximately 10 days in my office.                                              Alfredia Ferguson, M.D.   WBB/MEDQ  D:  07/26/2003  T:  07/27/2003  Job:  [id number]

## 2010-06-26 NOTE — Consult Note (Signed)
[facility]  Patient:    Kenneth Sellers, Kenneth Sellers Visit Number: [id number] MRN: [id number]          Service Type: MED Location: 3W 4782 01 Attending Physician:  Dolores Patty Dictated by:   Clovis Pu Patty Sermons, M.D. Proc. Date: 06/23/01 Admit Date:  06/22/2001   CC:         Titus Dubin. Alwyn Ren, M.D. [facility]   Consultation Report  CHIEF COMPLAINT:  Chest pain.  HISTORY:  This is a 55 year old gentleman admitted from Dr. Caryl Never office on May 15 because of a one-day history of chest pressure and a forceful heart beat.  He does not have any prior cardiac history.  He has never had a stress test or prior ischemic workup.  He does not get any regular aerobic exercise. Last year, he was diagnosed with asthma.  He has a history of elevated cholesterol and is on Zocor 80 mg a day.  Yesterday morning, he awoke with a forceful heart beat and a slight pressure in his chest.  Despite these symptoms, he went to work but left early and came by [facility] where his wife is a Engineer, civil (consulting), was checked, and then went on to Dr. Caryl Never office.  By the time he got there late in the afternoon, his pulse had returned to normal.  His EKG at that time was normal.  He was admitted for evaluation.  He did not have any nausea, vomiting, diaphoresis, or left arm radiation.  FAMILY HISTORY:  His father died of an MI at age 8.  There is a strong family history of high cholesterol.  He has a brother who had coronary artery bypass graft surgery in his mid 13s.  SOCIAL HISTORY:  The patient is a Runner, broadcasting/film/video at [employer].  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  He does not use tobacco, and he has an occasional drink on weekends.  He is married and has two college-age children at [place].  PAST MEDICAL HISTORY:  Includes a Nissen fundoplication for hiatal hernia five years ago.  He has also had a previous rhinoplasty.  REVIEW OF SYSTEMS:  Essentially unremarkable except for  present illness.  PHYSICAL EXAMINATION:  VITAL SIGNS:  Blood pressure 130/80, pulse 80 and regular, respirations normal.  HEENT:  Negative.  NECK:  Carotids negative.  CHEST:  Clear.  HEART:  No murmur, gallop, rub, or click.  ABDOMEN:  Soft with no hepatosplenomegaly or masses.  EXTREMITIES:  Good pulses, no edema.  DIAGNOSTIC DATA:  Cardiac enzymes reveal negative enzymes x 2.  EKG is negative for ischemia.  Telemetry shows normal sinus rhythm and no episodes of tachycardia since admission.  IMPRESSION: 1. Atypical chest pain and sensation of palpitations, resolved. 2. Myocardial infarction ruled out. 3. History of snoring. 4. History of hypercholesterolemia. 5. Strong family history of ischemic heart disease.  DISPOSITION:  Okay for discharge tonight from cardiac standpoint.  Will plan to do outpatient treadmill stress testing next week.  Instructions given.  We will add Ecotrin 81 mg daily to his regimen and also give him a prescription for Nitrostat 1/150 sublingually p.r.n. Dictated by:   Clovis Pu Patty Sermons, M.D. Attending Physician:  Dolores Patty DD:  06/23/01 TD:  06/23/01 Job: [id number]

## 2010-06-30 ENCOUNTER — Encounter: Payer: Self-pay | Admitting: Pulmonary Disease

## 2010-07-08 ENCOUNTER — Encounter: Payer: Self-pay | Admitting: Pulmonary Disease

## 2010-07-08 ENCOUNTER — Ambulatory Visit (INDEPENDENT_AMBULATORY_CARE_PROVIDER_SITE_OTHER): Payer: BC Managed Care – PPO | Admitting: Pulmonary Disease

## 2010-07-08 VITALS — BP 120/80 | HR 94 | Temp 97.9°F | Ht 70.0 in | Wt 208.0 lb

## 2010-07-08 DIAGNOSIS — G4733 Obstructive sleep apnea (adult) (pediatric): Secondary | ICD-10-CM

## 2010-07-08 NOTE — Progress Notes (Signed)
Subjective:    Patient ID: Kenneth Sellers, male    DOB: January 07, 1956, 55 y.o.   MRN: [id number]  HPI 55 yo male with sleep apnea.  He is using nasal pillows.  He goes to bed at 1 am and wakes up at 8 am.  He sleeps through the night.  He feels rested during the day.  He is not having any trouble from his mask.  Past Medical History  Diagnosis Date  . Hyperlipidemia   . OSA (obstructive sleep apnea)     PSG 08/26/02 AHI 59--CPAP 7 cm  . Allergic rhinitis     Dr. Fairburn Callas  . Asthma   . Diverticulosis of colon   . Pneumonia   . Tachycardia     Dr. Patty Sermons, negative cardiac work up     Family History  Problem Relation Age of Onset  . Heart attack Father 52  . Diabetes Mother   . Hypertension Mother   . Stroke Mother   . Heart failure Mother   . Diabetes Sister   . Allergies Sister   . Hypertension Brother   . Diabetes Brother   . Heart attack Brother   . Asthma      3 siblings     History   Social History  . Marital Status: Married    Spouse Name: N/A    Number of Children: N/A  . Years of Education: N/A   Occupational History  . retired    Social History Main Topics  . Smoking status: Never Smoker   . Smokeless tobacco: Never Used  . Alcohol Use: Yes     occasionally  . Drug Use: Not on file  . Sexually Active: Not on file   Other Topics Concern  . Not on file   Social History Narrative  . No narrative on file     No Known Allergies   Outpatient Prescriptions Prior to Visit  Medication Sig Dispense Refill  . albuterol (PROAIR HFA) 108 (90 BASE) MCG/ACT inhaler 2 puffs every 4-6 hours as needed       . Desoximetasone 0.05 % GEL Apply once a day as needed       . ezetimibe-simvastatin (VYTORIN) 10-40 MG per tablet Take 1 tablet by mouth at bedtime.        . Fluticasone-Salmeterol (ADVAIR DISKUS) 250-50 MCG/DOSE AEPB Inhale 1 puff into the lungs every 12 (twelve) hours as needed.       Marland Kitchen levocetirizine (XYZAL) 5 MG tablet Take 5 mg by mouth every  evening.        . montelukast (SINGULAIR) 10 MG tablet Take 10 mg by mouth at bedtime.         Review of Systems     Objective:   Physical Exam Filed Vitals:   07/08/10 0921  BP: 120/80  Pulse: 94  Temp: 97.9 F (36.6 C)  TempSrc: Oral  Height: 5\' 10"  (1.778 m)  Weight: 208 lb (94.348 kg)  SpO2: 98%    General: normal appearance and healthy appearing.  Nose: no deformity, discharge, inflammation, or lesions  Mouth: MP 2, retrognathic, high arched palate  Neck: no JVD.  Lungs: clear bilaterally to auscultation and percussion  Heart: regular rate and rhythm, S1, S2 without murmurs, rubs, gallops, or clicks  Extremities: no clubbing, cyanosis, edema, or deformity noted  Neurologic: normal CN II-XII and strength normal.  Cervical Nodes: no significant adenopathy  Psych: alert and cooperative; normal mood and affect; normal attention span and concentration  Assessment & Plan:   OBSTRUCTIVE SLEEP APNEA He is doing well. Will follow up in one year.  He is to call if he has trouble earlier than this.    Updated Medication List Outpatient Encounter Prescriptions as of 07/08/2010  Medication Sig Dispense Refill  . albuterol (PROAIR HFA) 108 (90 BASE) MCG/ACT inhaler 2 puffs every 4-6 hours as needed       . Desoximetasone 0.05 % GEL Apply once a day as needed       . ezetimibe-simvastatin (VYTORIN) 10-40 MG per tablet Take 1 tablet by mouth at bedtime.        . Fluticasone-Salmeterol (ADVAIR DISKUS) 250-50 MCG/DOSE AEPB Inhale 1 puff into the lungs every 12 (twelve) hours as needed.       Marland Kitchen levocetirizine (XYZAL) 5 MG tablet Take 5 mg by mouth every evening.        . montelukast (SINGULAIR) 10 MG tablet Take 10 mg by mouth at bedtime.

## 2010-07-08 NOTE — Patient Instructions (Signed)
Follow-up in one year.

## 2010-07-08 NOTE — Assessment & Plan Note (Signed)
He is doing well. Will follow up in one year.  He is to call if he has trouble earlier than this.

## 2011-01-06 ENCOUNTER — Other Ambulatory Visit: Payer: Self-pay | Admitting: Internal Medicine

## 2011-01-07 MED ORDER — EZETIMIBE-SIMVASTATIN 10-40 MG PO TABS: 1.0000 | ORAL_TABLET | Freq: Every day | ORAL | Status: DC

## 2011-01-07 NOTE — Telephone Encounter (Signed)
Patient needs to schedule a CPX and fasting labs Lipid/Hep/BMP/CBCD/TSH/Udip/Stool Cards v70.0/272.4/995.20

## 2011-02-12 ENCOUNTER — Other Ambulatory Visit: Payer: Self-pay | Admitting: Internal Medicine

## 2011-02-12 MED ORDER — EZETIMIBE-SIMVASTATIN 10-40 MG PO TABS: 1.0000 | ORAL_TABLET | Freq: Every day | ORAL | Status: DC

## 2011-02-12 NOTE — Telephone Encounter (Signed)
RX sent

## 2011-03-19 ENCOUNTER — Ambulatory Visit (INDEPENDENT_AMBULATORY_CARE_PROVIDER_SITE_OTHER): Payer: BC Managed Care – PPO | Admitting: Internal Medicine

## 2011-03-19 ENCOUNTER — Encounter: Payer: Self-pay | Admitting: Internal Medicine

## 2011-03-19 VITALS — BP 122/84 | HR 100 | Temp 98.3°F | Resp 12 | Ht 70.5 in | Wt 203.6 lb

## 2011-03-19 DIAGNOSIS — E782 Mixed hyperlipidemia: Secondary | ICD-10-CM

## 2011-03-19 DIAGNOSIS — Z0000 Encounter for general adult medical examination without abnormal findings: Secondary | ICD-10-CM

## 2011-03-19 LAB — LIPID PANEL
HDL: 54.8 mg/dL (ref >39.00)
LDL Cholesterol: 94 mg/dL (ref 0–99)
Total CHOL/HDL Ratio: 3

## 2011-03-19 LAB — HEPATIC FUNCTION PANEL
ALT: 36 U/L (ref 0–53)
Alkaline Phosphatase: 55 U/L (ref 39–117)
Bilirubin, Direct: 0.1 mg/dL (ref 0.0–0.3)
Total Bilirubin: 0.9 mg/dL (ref 0.3–1.2)
Total Protein: 7.1 g/dL (ref 6.0–8.3)

## 2011-03-19 LAB — CBC WITH DIFFERENTIAL/PLATELET
Basophils Absolute: 0.1 10*3/uL (ref 0.0–0.1)
Basophils Relative: 1.2 % (ref 0.0–3.0)
Eosinophils Absolute: 0.2 10*3/uL (ref 0.0–0.7)
Hemoglobin: 16.4 g/dL (ref 13.0–17.0)
Lymphocytes Relative: 24.7 % (ref 12.0–46.0)
Monocytes Relative: 15.1 % — ABNORMAL HIGH (ref 3.0–12.0)
Neutro Abs: 2.5 10*3/uL (ref 1.4–7.7)
Neutrophils Relative %: 55.5 % (ref 43.0–77.0)
RBC: 5.18 Mil/uL (ref 4.22–5.81)

## 2011-03-19 LAB — BASIC METABOLIC PANEL
Calcium: 9.4 mg/dL (ref 8.4–10.5)
Creatinine, Ser: 0.9 mg/dL (ref 0.4–1.5)
GFR: 92.3 mL/min (ref >60.00)
Sodium: 139 mEq/L (ref 135–145)

## 2011-03-19 LAB — TSH: TSH: 2.15 u[IU]/mL (ref 0.35–5.50)

## 2011-03-19 NOTE — Progress Notes (Signed)
Subjective:    Patient ID: Kenneth Sellers, male    DOB: 1955-09-08, 56 y.o.   MRN: [id number]  HPI Kenneth Sellers  is here for a physical; he denies acute issues.      Review of Systems Patient reports no  vision/ hearing changes,anorexia, weight change, fever ,adenopathy, persistant / recurrent hoarseness, swallowing issues, chest pain,palpitations, edema,persistant / recurrent cough, hemoptysis, dyspnea(rest, exertional, paroxysmal nocturnal), gastrointestinal  bleeding (melena, rectal bleeding), abdominal pain, excessive heart burn, GU symptoms( dysuria, hematuria, pyuria, voiding/incontinence  issues) syncope, focal weakness, memory loss,numbness & tingling, skin/hair/nail changes,depression, anxiety, abnormal bruising/bleeding,or  musculoskeletal symptoms/signs.  Asthma is well controlled with infrequent use of his rescue agent. Eczema is controlled with topical agents. He has intermittent "catch " in RU quadrant with lifting or coughing. He believes this is related to the prior surgery for reflux.     Objective:   Physical Exam Gen.: Healthy and well-nourished in appearance. Alert, appropriate and cooperative throughout exam. Head: Normocephalic without obvious abnormalities;  No alopecia  Eyes: No corneal or conjunctival inflammation noted. Pupils equal round reactive to light and accommodation. Fundal exam is benign without hemorrhages, exudate, papilledema. Extraocular motion intact. Vision grossly normal. Ears: External  ear exam reveals no significant lesions or deformities. Canals clear .TMs normal. Hearing is grossly normal bilaterally. Nose: External nasal exam reveals no deformity or inflammation. Nasal mucosa are pink and moist. No lesions or exudates noted.   Mouth: Oral mucosa and oropharynx reveal no lesions or exudates. Teeth in good repair. Neck: No deformities, masses, or tenderness noted. Range of motion & Thyroid normal Lungs: Normal respiratory effort; chest expands  symmetrically. Lungs are clear to auscultation without rales, wheezes, or increased work of breathing. Heart: Normal rate and rhythm. Normal S1 and S2. No gallop, click, or rub. S4 with slurring at  LSB ; no murmur. Abdomen: Bowel sounds normal; abdomen soft and nontender. No masses, or organomegaly. There is fullness in the right upper quadrant without a definite surgical site hernia. Genitalia/DRE: Genitalia exam is unremarkable. Prostate is normal without enlargement, nodularity, asymmetry, or induration                                                                             Musculoskeletal/extremities: No deformity or scoliosis noted of  the thoracic or lumbar spine. No clubbing, cyanosis, edema, or deformity noted. 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 Nail health  good. Vascular: Carotid, radial artery, dorsalis pedis and  posterior tibial pulses are full and equal. No bruits present. Neurologic: Alert and oriented x3. Deep tendon reflexes symmetrical and normal.          Skin: Intact without suspicious lesions or rashes. Lymph: No cervical, axillary, or inguinal lymphadenopathy present. Psych: Mood and affect are normal. Normally interactive  Assessment & Plan:  #1 comprehensive physical exam; no acute findings #2 see Problem List with Assessments & Recommendations  #3 operative site hernia aggravated by lifting or coughing. Options discussed Plan: see Orders

## 2011-03-19 NOTE — Patient Instructions (Signed)
Preventive Health Care: Exercise at least 30-45 minutes a day,  3-4 days a week.  Eat a low-fat diet with lots of fruits and vegetables, up to 7-9 servings per day. Consume less than 40 grams of sugar per day from foods & drinks with High Fructose Corn Sugar as # 1,2,3 or # 4 on label. Health Care Power of Attorney & Living Will. Complete if not in place ; these place you in charge of your health care decisions. 

## 2011-05-31 ENCOUNTER — Other Ambulatory Visit: Payer: Self-pay | Admitting: Internal Medicine

## 2011-12-03 ENCOUNTER — Other Ambulatory Visit: Payer: Self-pay | Admitting: Internal Medicine

## 2011-12-03 NOTE — Telephone Encounter (Signed)
Refill done.  

## 2012-04-12 ENCOUNTER — Encounter: Payer: Self-pay | Admitting: Gastroenterology

## 2012-04-14 ENCOUNTER — Other Ambulatory Visit: Payer: Self-pay | Admitting: Internal Medicine

## 2012-04-15 ENCOUNTER — Emergency Department (INDEPENDENT_AMBULATORY_CARE_PROVIDER_SITE_OTHER): Payer: BC Managed Care – PPO

## 2012-04-15 ENCOUNTER — Encounter (HOSPITAL_COMMUNITY): Payer: Self-pay | Admitting: *Deleted

## 2012-04-15 ENCOUNTER — Emergency Department (INDEPENDENT_AMBULATORY_CARE_PROVIDER_SITE_OTHER)
Admission: EM | Admit: 2012-04-15 | Discharge: 2012-04-15 | Disposition: A | Discharge status: Home / Self Care | Payer: BC Managed Care – PPO | Source: Home / Self Care

## 2012-04-15 DIAGNOSIS — J069 Acute upper respiratory infection, unspecified: Secondary | ICD-10-CM

## 2012-04-15 DIAGNOSIS — R0982 Postnasal drip: Secondary | ICD-10-CM

## 2012-04-15 DIAGNOSIS — J329 Chronic sinusitis, unspecified: Secondary | ICD-10-CM

## 2012-04-15 MED ORDER — AZELASTINE HCL 0.1 % NA SOLN: 1.0000 | Freq: Two times a day (BID) | NASAL | Status: DC

## 2012-04-15 MED ORDER — FLUTICASONE PROPIONATE 50 MCG/ACT NA SUSP: 2.0000 | Freq: Every day | NASAL | Status: DC

## 2012-04-15 NOTE — ED Notes (Signed)
Triage vital for Heart Rate was entered in error. Rate should have been 90 bpm.

## 2012-04-15 NOTE — ED Provider Notes (Signed)
History     CSN: [id number]  Arrival date & time 04/15/12  1120   First MD Initiated Contact with Patient 04/15/12 1144      Chief Complaint  Patient presents with  . URI    (Consider location/radiation/quality/duration/timing/severity/associated sxs/prior treatment) HPI Comments:  57 year old male presents with chest and nasal congestion for approximately one week. Is complaining of cough, some shortness of breath, rattling in the anterior chest and discomfort at the upper right sternal border. Is also complaining of sinus and nasal congestion. He states it first started out as a rattling in his chest he later developed the nasal congestion. Denies fever, PND, earache or sore throat. He has a history of asthma and allergic rhinitis. He occasionally uses a rescue inhaler of albuterol and daily Advair. He is currently taking Mucinex DM for his symptoms.   Past Medical History  Diagnosis Date  . Hyperlipidemia   . OSA (obstructive sleep apnea)     PSG 08/26/02 AHI 57--CPAP 7 cm  . Allergic rhinitis     Dr. Hazel Green Callas  . Asthma   . Diverticulosis of colon   . Pneumonia 1988    treated as OP  . Tachycardia     Dr. Patty Sermons, negative cardiac work up    Past Surgical History  Procedure Laterality Date  . Reflux surgery  1997     Dr Jamey Ripa; complicated by herniation post op with retching requiring 2nd surgery  . Rhinoplasty  1983  . Lesion removed l neck; pigmented mole at hairline, non melanoma  07/2003  . Colon tics  03/2005    Family History  Problem Relation Age of Onset  . Heart attack Father 35  . Diabetes Mother   . Hypertension Mother   . Stroke Mother 10  . Heart failure Mother   . Diabetes Sister   . Allergies Sister   . Hypertension Brother   . Diabetes Brother   . Heart attack Brother 64    smoker  . Asthma      1 sister & 2 brothers    History  Substance Use Topics  . Smoking status: Never Smoker   . Smokeless tobacco: Never Used  . Alcohol Use: Yes   Comment: occasionally      Review of Systems  Constitutional: Negative for fever, diaphoresis, activity change and fatigue.  HENT: Positive for congestion and sinus pressure. Negative for ear pain, sore throat, facial swelling, rhinorrhea, trouble swallowing, neck pain, neck stiffness and postnasal drip.   Eyes: Negative for pain, discharge and redness.  Respiratory: Positive for cough and shortness of breath. Negative for chest tightness and wheezing.   Cardiovascular: Negative.   Gastrointestinal: Negative.   Musculoskeletal: Negative.   Allergic/Immunologic: Positive for environmental allergies. Negative for immunocompromised state.  Neurological: Negative.   Psychiatric/Behavioral: Negative.     Allergies  Review of patient's allergies indicates no known allergies.  Home Medications   Current Outpatient Rx  Name  Route  Sig  Dispense  Refill  . albuterol (PROAIR HFA) 108 (90 BASE) MCG/ACT inhaler      2 puffs every 4-6 hours as needed          . AMBULATORY NON FORMULARY MEDICATION      CPAP machine nightly         . azelastine (ASTELIN) 137 MCG/SPRAY nasal spray   Nasal   Place 1 spray into the nose 2 (two) times daily. Use in each nostril as directed   30 mL   12   .  Desoximetasone 0.05 % GEL      Apply once a day as needed          . fluticasone (FLONASE) 50 MCG/ACT nasal spray   Nasal   Place 2 sprays into the nose daily.   16 g   2   . Fluticasone-Salmeterol (ADVAIR DISKUS) 250-50 MCG/DOSE AEPB   Inhalation   Inhale 1 puff into the lungs every 12 (twelve) hours as needed.          Marland Kitchen levocetirizine (XYZAL) 5 MG tablet   Oral   Take 5 mg by mouth every evening.           . montelukast (SINGULAIR) 10 MG tablet   Oral   Take 10 mg by mouth at bedtime.           . Multiple Vitamin (MULTIVITAMIN) tablet   Oral   Take 1 tablet by mouth daily.         Marland Kitchen VYTORIN 10-40 MG per tablet      TAKE 1 TABLET BY MOUTH AT BEDTIME.   30 tablet    3     BP 147/97  Pulse 90  Temp(Src) 98.8 F (37.1 C) (Oral)  Resp 18  SpO2 94%  Physical Exam  Constitutional: He is oriented to person, place, and time. He appears well-developed and well-nourished. No distress.  HENT:  Bilateral TMs are normal Oropharynx erythematous with cobblestoning and clear PND. No exudates or swelling.  Eyes: Conjunctivae and EOM are normal.  Neck: Normal range of motion. Neck supple.  Cardiovascular: Normal rate, regular rhythm and normal heart sounds.   Pulmonary/Chest: Effort normal and breath sounds normal. No respiratory distress. He has no wheezes. He has no rales.  Musculoskeletal: Normal range of motion. He exhibits no edema.  Lymphadenopathy:    He has no cervical adenopathy.  Neurological: He is alert and oriented to person, place, and time. He exhibits normal muscle tone.  Skin: Skin is warm and dry. No rash noted.  Psychiatric: He has a normal mood and affect.     ED Course  Procedures (including critical care time)  Labs Reviewed - No data to display Dg Chest 2 View  04/15/2012  *RADIOLOGY REPORT*  Clinical Data: Cough.  Dyspnea.  Shortness of breath.  History of asthma.  CHEST - 2 VIEW  Comparison: None.  Findings: Midline trachea.  Normal heart size and mediastinal contours. No pleural effusion or pneumothorax.  Minimally low lung volumes. Clear lungs.  Surgical changes in the left upper quadrant.  IMPRESSION: No acute cardiopulmonary disease.   Original Report Authenticated By: Jeronimo Greaves, M.D.      1. URI (upper respiratory infection)   2. PND (post-nasal drip)   3. Sinusitis       MDM  Fluticasone nasal spray as Astelin nasal spray as direct Continue with saline nasal spray. May also continue Mucinex DM. Had either Claritin or Allegra as an antihistamine. There is a strong enough to use Chlor-Trimeton . Drink plenty of fluids        Hayden Rasmussen, NP 04/15/12 1345

## 2012-04-15 NOTE — ED Notes (Signed)
Patient complains of chest congestion cough and pain x 1 week. Denies fever/chills, but has sweats and shortness of breat.

## 2012-04-16 NOTE — ED Provider Notes (Signed)
  I have directly reviewed the clinical findings, lab, imaging studies and management of this patient in detail. I have interviewed and examined the patient and agree with the documentation,  as recorded by the Physician extender.  SINGH,PRASHANT K M.D on 04/16/2012 at 12:54 PM  [facility] Office  [phone]  

## 2012-04-17 NOTE — Telephone Encounter (Signed)
LIPID/HEP 272.4/995.20, and SCHEDULED CPX

## 2012-04-20 ENCOUNTER — Telehealth: Payer: Self-pay | Admitting: Internal Medicine

## 2012-04-20 MED ORDER — HYDROCODONE-HOMATROPINE 5-1.5 MG/5ML PO SYRP: 2.5000 mL | ORAL_SOLUTION | Freq: Four times a day (QID) | ORAL | Status: DC | PRN

## 2012-04-20 NOTE — Telephone Encounter (Signed)
Spoke with patient, patient verbalized understanding of Dr.Hopper's response

## 2012-04-20 NOTE — Telephone Encounter (Signed)
Patient Information:  Caller Name: [place]  Phone: [phone]  Patient: Kenneth Sellers, Kenneth Sellers  Gender: Male  DOB: January 01, 1956  Age: 57 Years  PCP: Marga Melnick  Office Follow Up:  Does the office need to follow up with this patient?: Yes  Instructions For The Office: request Rx  RN Note:  Patient is asking for Rx for cough ? antibiotic.  Ongoing coughing.  Pharmacy- [facility] # [phone]  Symptoms  Reason For Call & Symptoms: Patient states he has a sinus infection. Seen at [facility]  , 04/15/12 . Diagnosed with URI and sinusitis. Xray was clear.  Placed on two nasal sprays and Sinus Allergy OTC.   Patient states he did not feel any better until yesterday. He states the cough is worse and cannot sleep. He has Deslym and it is not helping.  Productive cough -yellow/green/milky white  Reviewed Health History In EMR: Yes  Reviewed Medications In EMR: Yes  Reviewed Allergies In EMR: Yes  Reviewed Surgeries / Procedures: No  Date of Onset of Symptoms: 04/06/2012  Treatments Tried: Nasal spray-Flonase,  OTC- Cough medication Delsym, OTC sinus allergy PE  Treatments Tried Worked: Yes  Guideline(s) Used:  Cough  Disposition Per Guideline:   See Within 3 Days in Office  Reason For Disposition Reached:   Cough has been present for > 10 days  Advice Given:  Reassurance  Coughing is the way that our lungs remove irritants and mucus. It helps protect our lungs from getting pneumonia.  Cough Medicines:  OTC Cough Syrups: The most common cough suppressant in OTC cough medications is dextromethorphan. Often the letters "DM" appear in the name.  Home Remedy - Hard Candy: Hard candy works just as well as medicine-flavored OTC cough drops. Diabetics should use sugar-free candy.  Home Remedy - Honey: This old home remedy has been shown to help decrease coughing at night. The adult dosage is 2 teaspoons (10 ml) at bedtime. Honey should not be given to infants under one year of age.  OTC Cough Syrup - Dextromethorphan:  Cough syrups containing the cough suppressant dextromethorphan (DM) may help decrease your cough. Cough syrups work best for coughs that keep you awake at night. They can also sometimes help in the late stages of a respiratory infection when the cough is dry and hacking. They can be used along with cough drops.  Examples: Benylin, Robitussin DM, Vicks 44 Cough Relief  Read the package instructions for dosage, contraindications, and other important information.  Prevent Dehydration:  Drink adequate liquids.  This will help soothe an irritated or dry throat and loosen up the phlegm.  Avoid Tobacco Smoke:  Smoking or being exposed to smoke makes coughs much worse.  Call Back If:  Difficulty breathing  Cough lasts more than 3 weeks  Fever lasts > 3 days  You become worse.  RN Overrode Recommendation:  Patient Requests Prescription  request Rx cough medication and ? need for antibiotic

## 2012-04-20 NOTE — Telephone Encounter (Signed)
Hopp please advise, patient was seen at [facility] on 04/15/2012 (OV in Epic)

## 2012-04-20 NOTE — Telephone Encounter (Signed)
Generic Hydromet 1  Tsp q 6-8 hrs prn ,120cc.  OVINB

## 2012-06-23 ENCOUNTER — Telehealth: Payer: Self-pay | Admitting: Internal Medicine

## 2012-06-23 DIAGNOSIS — E782 Mixed hyperlipidemia: Secondary | ICD-10-CM

## 2012-06-23 MED ORDER — EZETIMIBE-SIMVASTATIN 10-40 MG PO TABS: ORAL_TABLET | ORAL | Status: DC

## 2012-06-23 NOTE — Telephone Encounter (Signed)
Since pt has a CPE scheduled next month filled meds until the appt. Also placed orders for Lipids and Hepatic per Chrae's last note. Pt has an appt for labs.

## 2012-06-23 NOTE — Telephone Encounter (Signed)
Patient called to get new rx for vytorin. Patient states his pharmacy has faxed [place], however, I do not see anything in the chart. Patient made CPE appt for 08/04/12 and would like to have labs done prior to this appointment. Can we place orders please? He has [employer]. Pt uses [place] outpatient pharmacy.

## 2012-07-28 ENCOUNTER — Encounter: Payer: Self-pay | Admitting: *Deleted

## 2012-07-28 ENCOUNTER — Other Ambulatory Visit (INDEPENDENT_AMBULATORY_CARE_PROVIDER_SITE_OTHER): Payer: BC Managed Care – PPO

## 2012-07-28 DIAGNOSIS — E782 Mixed hyperlipidemia: Secondary | ICD-10-CM

## 2012-07-28 LAB — HEPATIC FUNCTION PANEL
ALT: 28 U/L (ref 0–53)
AST: 21 U/L (ref 0–37)
Albumin: 4.3 g/dL (ref 3.5–5.2)
Alkaline Phosphatase: 56 U/L (ref 39–117)
Bilirubin, Direct: 0 mg/dL (ref 0.0–0.3)
Total Protein: 7.1 g/dL (ref 6.0–8.3)

## 2012-07-28 LAB — LIPID PANEL
Cholesterol: 156 mg/dL (ref 0–200)
Triglycerides: 193 mg/dL — ABNORMAL HIGH (ref 0.0–149.0)

## 2012-08-04 ENCOUNTER — Ambulatory Visit (INDEPENDENT_AMBULATORY_CARE_PROVIDER_SITE_OTHER): Payer: BC Managed Care – PPO | Admitting: Internal Medicine

## 2012-08-04 ENCOUNTER — Encounter: Payer: Self-pay | Admitting: Internal Medicine

## 2012-08-04 VITALS — BP 140/88 | HR 87 | Temp 98.1°F | Ht 70.0 in | Wt 198.0 lb

## 2012-08-04 DIAGNOSIS — Z0000 Encounter for general adult medical examination without abnormal findings: Secondary | ICD-10-CM

## 2012-08-04 DIAGNOSIS — E782 Mixed hyperlipidemia: Secondary | ICD-10-CM

## 2012-08-04 MED ORDER — EZETIMIBE-SIMVASTATIN 10-40 MG PO TABS: ORAL_TABLET | ORAL | Status: DC

## 2012-08-04 MED ORDER — DESOXIMETASONE 0.05 % EX GEL: CUTANEOUS | Status: DC

## 2012-08-04 NOTE — Progress Notes (Signed)
  Subjective:    Patient ID: Kenneth Sellers, male    DOB: 11-Jun-1955, 57 y.o.   MRN: [id number]  HPI  He is here for a physical;acute issues include recent low back strain which responded to NSAIDS.     Review of Systems He is not on a heart healthy diet; he is not  eercising regularly.He denies chest pain, palpitations, dyspnea, or claudication. Family history is positive for premature coronary disease in a brother. Advanced cholesterol testing reveals his LDL goal is less than 105.     Objective:   Physical Exam Gen.: Healthy and well-nourished in appearance. Alert, appropriate and cooperative throughout exam.Appears younger than stated age  Head: Normocephalic without obvious abnormalities; no alopecia  Eyes: No corneal or conjunctival inflammation noted. Pupils equal round reactive to light . Extraocular motion intact. Vision grossly normal with lenses Ears: External  ear exam reveals no significant lesions or deformities. Wax on R ; L TM normal. Hearing is grossly normal bilaterally. Nose: External nasal exam reveals no deformity or inflammation. Nasal mucosa are pink and moist. No lesions or exudates noted.   Mouth: Oral mucosa and oropharynx reveal no lesions or exudates. Teeth in good repair. Neck: No deformities, masses, or tenderness noted. Range of motion & Thyroid normal. Lungs: Normal respiratory effort; chest expands symmetrically. Lungs are clear to auscultation without rales, wheezes, or increased work of breathing. Heart: Normal rate and rhythm. Normal S1 and S2. No gallop, click, or rub. S4 w/o murmur. Abdomen: Bowel sounds normal; abdomen soft and nontender. No masses, organomegaly or hernias noted. Genitalia: Genitalia normal except for left varices. Prostate is normal without enlargement, asymmetry, nodularity, or induration.                                 Musculoskeletal/extremities: No deformity or scoliosis noted of  the thoracic or lumbar spine.  No clubbing, cyanosis,  edema, or significant extremity  deformity noted. Range of motion normal .Tone & strength  Normal. 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 Nail health good. Able to lie down & sit up w/o help. Negative SLR bilaterally Vascular: Carotid, radial artery, dorsalis pedis and  posterior tibial pulses are full and equal. No bruits present. Neurologic: Alert and oriented x3. Deep tendon reflexes symmetrical and normal.  Gait normal  including heel & toe walking .        Skin: Intact without suspicious lesions or rashes. Lymph: No cervical, axillary, or inguinal lymphadenopathy present. Psych: Mood and affect are normal. Normally interactive                                                                                        Assessment & Plan:  #1 comprehensive physical exam; no acute findings  Plan: see Orders  & Recommendations

## 2012-08-04 NOTE — Patient Instructions (Addendum)
The most common cause of elevated triglycerides (TG) is the ingestion of sugar from high fructose corn syrup sources added to processed foods & drinks.  Eat a low-fat diet with lots of fruits and vegetables, up to 7-9 servings per day. Consume less than 40  Grams (preferably ZERO) of sugar per day from foods & drinks with High Fructose Corn Syrup (HFCS) sugar as #1,2,3 or # 4 on label.Whole Foods, [employer] & [employer] do not carry products with HFCS. Cardiovascular exercise, this can be as simple a program as walking, is recommended 30-45 minutes 3-4 times per week. If you're not exercising you should take 6-8 weeks to build up to this level. Please  schedule fasting Labs in 14-16 weeks after nutrition & exercise changes: BMET,Lipids, TSH. PLEASE BRING THESE INSTRUCTIONS TO FOLLOW UP  LAB APPOINTMENT.This will guarantee correct labs are drawn, eliminating need for repeat blood sampling ( needle sticks ! ). Diagnoses /Codes: 272.4.

## 2012-08-05 ENCOUNTER — Encounter: Payer: Self-pay | Admitting: Internal Medicine

## 2012-12-14 ENCOUNTER — Other Ambulatory Visit: Payer: Self-pay

## 2012-12-14 ENCOUNTER — Ambulatory Visit (INDEPENDENT_AMBULATORY_CARE_PROVIDER_SITE_OTHER): Payer: BC Managed Care – PPO | Admitting: *Deleted

## 2012-12-14 DIAGNOSIS — Z23 Encounter for immunization: Secondary | ICD-10-CM

## 2013-08-01 ENCOUNTER — Encounter: Payer: Self-pay | Admitting: Internal Medicine

## 2013-08-01 ENCOUNTER — Ambulatory Visit (INDEPENDENT_AMBULATORY_CARE_PROVIDER_SITE_OTHER): Payer: BC Managed Care – PPO | Admitting: Internal Medicine

## 2013-08-01 ENCOUNTER — Emergency Department (HOSPITAL_COMMUNITY): Payer: BC Managed Care – PPO

## 2013-08-01 ENCOUNTER — Emergency Department (HOSPITAL_COMMUNITY)
Admission: EM | Admit: 2013-08-01 | Discharge: 2013-08-01 | Disposition: A | Discharge status: Home / Self Care | Payer: BC Managed Care – PPO | Attending: Emergency Medicine | Admitting: Emergency Medicine

## 2013-08-01 ENCOUNTER — Other Ambulatory Visit: Payer: BC Managed Care – PPO

## 2013-08-01 VITALS — BP 134/96 | HR 122 | Temp 99.0°F | Wt 201.2 lb

## 2013-08-01 DIAGNOSIS — Z8719 Personal history of other diseases of the digestive system: Secondary | ICD-10-CM | POA: Diagnosis not present

## 2013-08-01 DIAGNOSIS — E860 Dehydration: Secondary | ICD-10-CM | POA: Diagnosis not present

## 2013-08-01 DIAGNOSIS — R042 Hemoptysis: Secondary | ICD-10-CM | POA: Insufficient documentation

## 2013-08-01 DIAGNOSIS — R109 Unspecified abdominal pain: Secondary | ICD-10-CM

## 2013-08-01 DIAGNOSIS — Z8701 Personal history of pneumonia (recurrent): Secondary | ICD-10-CM | POA: Insufficient documentation

## 2013-08-01 DIAGNOSIS — Y939 Activity, unspecified: Secondary | ICD-10-CM | POA: Diagnosis not present

## 2013-08-01 DIAGNOSIS — G4733 Obstructive sleep apnea (adult) (pediatric): Secondary | ICD-10-CM | POA: Diagnosis not present

## 2013-08-01 DIAGNOSIS — J209 Acute bronchitis, unspecified: Secondary | ICD-10-CM | POA: Diagnosis not present

## 2013-08-01 DIAGNOSIS — J45909 Unspecified asthma, uncomplicated: Secondary | ICD-10-CM | POA: Insufficient documentation

## 2013-08-01 DIAGNOSIS — R000 Tachycardia, unspecified: Secondary | ICD-10-CM

## 2013-08-01 DIAGNOSIS — Z9981 Dependence on supplemental oxygen: Secondary | ICD-10-CM | POA: Insufficient documentation

## 2013-08-01 DIAGNOSIS — Y929 Unspecified place or not applicable: Secondary | ICD-10-CM | POA: Insufficient documentation

## 2013-08-01 DIAGNOSIS — R509 Fever, unspecified: Secondary | ICD-10-CM | POA: Diagnosis present

## 2013-08-01 DIAGNOSIS — Z79899 Other long term (current) drug therapy: Secondary | ICD-10-CM | POA: Insufficient documentation

## 2013-08-01 DIAGNOSIS — T148 Other injury of unspecified body region: Secondary | ICD-10-CM | POA: Diagnosis not present

## 2013-08-01 DIAGNOSIS — R8290 Unspecified abnormal findings in urine: Secondary | ICD-10-CM

## 2013-08-01 DIAGNOSIS — W57XXXA Bitten or stung by nonvenomous insect and other nonvenomous arthropods, initial encounter: Secondary | ICD-10-CM | POA: Insufficient documentation

## 2013-08-01 DIAGNOSIS — J40 Bronchitis, not specified as acute or chronic: Secondary | ICD-10-CM

## 2013-08-01 DIAGNOSIS — R112 Nausea with vomiting, unspecified: Secondary | ICD-10-CM

## 2013-08-01 LAB — POCT URINALYSIS DIPSTICK

## 2013-08-01 LAB — CBC WITH DIFFERENTIAL/PLATELET
Basophils Absolute: 0 10*3/uL (ref 0.0–0.1)
Basophils Relative: 0 % (ref 0–1)
EOS ABS: 0 10*3/uL (ref 0.0–0.7)
EOS PCT: 0 % (ref 0–5)
HCT: 48.8 % (ref 39.0–52.0)
HEMOGLOBIN: 17.3 g/dL — AB (ref 13.0–17.0)
LYMPHS PCT: 6 % — AB (ref 12–46)
Lymphs Abs: 0.9 10*3/uL (ref 0.7–4.0)
MCH: 31.2 pg (ref 26.0–34.0)
MCHC: 35.5 g/dL (ref 30.0–36.0)
MCV: 88.1 fL (ref 78.0–100.0)
MONOS PCT: 13 % — AB (ref 3–12)
Monocytes Absolute: 1.9 10*3/uL — ABNORMAL HIGH (ref 0.1–1.0)
Neutro Abs: 12.1 10*3/uL — ABNORMAL HIGH (ref 1.7–7.7)
Neutrophils Relative %: 81 % — ABNORMAL HIGH (ref 43–77)
PLATELETS: 187 10*3/uL (ref 150–400)
RBC: 5.54 MIL/uL (ref 4.22–5.81)
RDW: 12.4 % (ref 11.5–15.5)
WBC: 15 10*3/uL — AB (ref 4.0–10.5)

## 2013-08-01 LAB — I-STAT CHEM 8, ED
BUN: 14 mg/dL (ref 6–23)
CREATININE: 1 mg/dL (ref 0.50–1.35)
Calcium, Ion: 1.16 mmol/L (ref 1.12–1.23)
Chloride: 100 mEq/L (ref 96–112)
Glucose, Bld: 126 mg/dL — ABNORMAL HIGH (ref 70–99)
HCT: 53 % — ABNORMAL HIGH (ref 39.0–52.0)
HEMOGLOBIN: 18 g/dL — AB (ref 13.0–17.0)
POTASSIUM: 3.9 meq/L (ref 3.7–5.3)
SODIUM: 141 meq/L (ref 137–147)
TCO2: 24 mmol/L (ref 0–100)

## 2013-08-01 MED ORDER — ONDANSETRON HCL 4 MG PO TABS: 4.0000 mg | ORAL_TABLET | Freq: Four times a day (QID) | ORAL | Status: DC

## 2013-08-01 MED ORDER — ONDANSETRON HCL 4 MG/2ML IJ SOLN
4.0000 mg | Freq: Once | INTRAMUSCULAR | Status: AC
  Administered 2013-08-01: 4 mg via INTRAVENOUS
  Filled 2013-08-01: qty 2

## 2013-08-01 MED ORDER — MORPHINE SULFATE 4 MG/ML IJ SOLN
4.0000 mg | Freq: Once | INTRAMUSCULAR | Status: AC
  Administered 2013-08-01: 4 mg via INTRAVENOUS
  Filled 2013-08-01: qty 1

## 2013-08-01 MED ORDER — DOXYCYCLINE HYCLATE 100 MG PO TABS
100.0000 mg | ORAL_TABLET | Freq: Once | ORAL | Status: AC
  Administered 2013-08-01: 100 mg via ORAL
  Filled 2013-08-01: qty 1

## 2013-08-01 MED ORDER — DOXYCYCLINE HYCLATE 100 MG PO CAPS: 100.0000 mg | ORAL_CAPSULE | Freq: Two times a day (BID) | ORAL | Status: DC

## 2013-08-01 MED ORDER — HYDROCODONE-HOMATROPINE 5-1.5 MG/5ML PO SYRP: 5.0000 mL | ORAL_SOLUTION | Freq: Four times a day (QID) | ORAL | Status: DC | PRN

## 2013-08-01 MED ORDER — SODIUM CHLORIDE 0.9 % IV BOLUS (SEPSIS)
1000.0000 mL | Freq: Once | INTRAVENOUS | Status: AC
  Administered 2013-08-01: 1000 mL via INTRAVENOUS

## 2013-08-01 MED ORDER — ACETAMINOPHEN 325 MG PO TABS
650.0000 mg | ORAL_TABLET | Freq: Once | ORAL | Status: AC
  Administered 2013-08-01: 650 mg via ORAL
  Filled 2013-08-01: qty 2

## 2013-08-01 NOTE — ED Notes (Signed)
Pt sent by Dr. Linna Darner for persistent tachycardia, fever, hemoptysis, bronchitis, NV since June 14th.

## 2013-08-01 NOTE — ED Notes (Signed)
Patient transported to X-ray 

## 2013-08-01 NOTE — ED Provider Notes (Addendum)
CSN: [id number]     Arrival date & time 08/01/13  1759 History   First MD Initiated Contact with Patient 08/01/13 1816     Chief Complaint  Patient presents with  . Fever  . Tachycardia  . Hemoptysis     (Consider location/radiation/quality/duration/timing/severity/associated sxs/prior Treatment) HPI Comments: Pt with hx of URI sx for the last 20 days with cough and congestion and last night started to feel worse with fever, headache, productive cough, nausea and retching.  Pt has had a nisson fundoplication and can't vomit.  States some mild blood streaking after retching and violent coughing.  Minimal SOB and occassional wheezing that he is using his inhaler for.  Pt also noted he was bit by a tick at the end of June but denies rash or other sx.  Patient is a 58 y.o. male presenting with fever. The history is provided by the patient.  Fever Max temp prior to arrival:  101.2 Temp source:  Oral Severity:  Moderate Onset quality:  Gradual Duration:  1 day Timing:  Constant Progression:  Waxing and waning Chronicity:  New Relieved by:  Acetaminophen Worsened by:  Nothing tried Ineffective treatments:  None tried Associated symptoms: chills, congestion, cough, myalgias, nausea, rhinorrhea and vomiting   Associated symptoms: no rash   Risk factors: no recent travel   Risk factors comment:  Asthma   Past Medical History  Diagnosis Date  . Hyperlipidemia   . OSA (obstructive sleep apnea)     PSG 08/26/02 AHI 57--CPAP 7 cm  . Allergic rhinitis     Dr. Donneta Romberg  . Asthma   . Diverticulosis of colon   . Pneumonia 1988    treated as OP  . Tachycardia 2003    Dr. Mare Ferrari, negative cardiac work up   Past Surgical History  Procedure Laterality Date  . Reflux surgery  1997     Dr Margot Chimes; complicated by herniation post op with retching requiring 2nd surgery  . Rhinoplasty  1983  . Lesion removed l neck; pigmented mole at hairline, non melanoma  07/2003  . Colon tics  03/2005     [facility]   Family History  Problem Relation Age of Onset  . Heart attack Father 51  . Diabetes Mother   . Hypertension Mother   . Stroke Mother 10  . Heart failure Mother   . Diabetes Sister   . Allergies Sister   . Hypertension Brother   . Diabetes Brother     2 brothers  . Heart attack Brother 49    smoker  . Asthma      2 sisters & 2 brothers  . COPD Brother    History  Substance Use Topics  . Smoking status: Never Smoker   . Smokeless tobacco: Never Used  . Alcohol Use: Yes     Comment: occasionally    Review of Systems  Constitutional: Positive for fever and chills.  HENT: Positive for congestion and rhinorrhea.   Respiratory: Positive for cough.   Gastrointestinal: Positive for nausea and vomiting.  Musculoskeletal: Positive for myalgias.  Skin: Negative for rash.  All other systems reviewed and are negative.     Allergies  Review of patient's allergies indicates no known allergies.  Home Medications   Prior to Admission medications   Medication Sig Start Date End Date Taking? Authorizing Provider  albuterol (PROAIR HFA) 108 (90 BASE) MCG/ACT inhaler 2 puffs every 4-6 hours as needed     Historical Provider, MD  AMBULATORY NON  FORMULARY MEDICATION CPAP machine nightly    Historical Provider, MD  Desoximetasone 0.05 % GEL Apply once a day as needed 08/04/12   Hendricks Limes, MD  ezetimibe-simvastatin (VYTORIN) 10-40 MG per tablet 1 by mouth at bedtime 08/04/12   Hendricks Limes, MD  Fluticasone-Salmeterol (ADVAIR DISKUS) 250-50 MCG/DOSE AEPB Inhale 1 puff into the lungs every 12 (twelve) hours as needed.     Historical Provider, MD  levocetirizine (XYZAL) 5 MG tablet Take 5 mg by mouth every evening.      Historical Provider, MD  montelukast (SINGULAIR) 10 MG tablet Take 10 mg by mouth at bedtime.      Historical Provider, MD  Multiple Vitamin (MULTIVITAMIN) tablet Take 1 tablet by mouth daily.    Historical Provider, MD   BP 147/97  Pulse 123   Temp(Src) 99.9 F (37.7 C) (Oral)  Resp 20  SpO2 96% Physical Exam  Nursing note and vitals reviewed. Constitutional: He is oriented to person, place, and time. He appears well-developed and well-nourished. No distress.  HENT:  Head: Normocephalic and atraumatic.  Right Ear: Tympanic membrane and ear canal normal.  Left Ear: Tympanic membrane and ear canal normal.  Nose: Mucosal edema present.  Mouth/Throat: Mucous membranes are dry. No uvula swelling. Posterior oropharyngeal erythema present. No oropharyngeal exudate, posterior oropharyngeal edema or tonsillar abscesses.  Eyes: Conjunctivae and EOM are normal. Pupils are equal, round, and reactive to light.  Neck: Normal range of motion. Neck supple. No spinous process tenderness and no muscular tenderness present.  Cardiovascular: Regular rhythm and intact distal pulses.  Tachycardia present.   No murmur heard. Pulmonary/Chest: Effort normal and breath sounds normal. No respiratory distress. He has no wheezes. He has no rales.  Abdominal: Soft. He exhibits no distension. There is no tenderness. There is no rebound and no guarding.  Musculoskeletal: Normal range of motion. He exhibits no edema and no tenderness.  Lymphadenopathy:    He has no cervical adenopathy.  Neurological: He is alert and oriented to person, place, and time.  Skin: Skin is warm and dry. No rash noted. No erythema.  No rash on palms or soles  Psychiatric: He has a normal mood and affect. His behavior is normal.    ED Course  Procedures (including critical care time) Labs Review Labs Reviewed  CBC WITH DIFFERENTIAL - Abnormal; Notable for the following:    WBC 15.0 (*)    Hemoglobin 17.3 (*)    Neutrophils Relative % 81 (*)    Neutro Abs 12.1 (*)    Lymphocytes Relative 6 (*)    Monocytes Relative 13 (*)    Monocytes Absolute 1.9 (*)    All other components within normal limits  I-STAT CHEM 8, ED - Abnormal; Notable for the following:    Glucose, Bld 126  (*)    Hemoglobin 18.0 (*)    HCT 53.0 (*)    All other components within normal limits    Imaging Review Dg Chest 2 View  08/01/2013   CLINICAL DATA:  Upper central chest pain for 10 days. Cough and congestion.  EXAM: CHEST  2 VIEW  COMPARISON:  04/15/2012  FINDINGS: Mild atherosclerotic calcification of the aortic arch. Cardiac and mediastinal margins appear normal.  Mild thoracic spondylosis. Given the mildly low lung volumes, the lungs appear clear. ?  IMPRESSION: 1. No acute findings. 2. Mild aortic atherosclerosis and mild thoracic spondylosis.   Electronically Signed   By: Sherryl Barters M.D.   On: 08/01/2013 19:03  EKG Interpretation   Date/Time:  Wednesday August 01 2013 18:31:43 EDT Ventricular Rate:  118 PR Interval:  140 QRS Duration: 86 QT Interval:  333 QTC Calculation: 466 R Axis:   -74 Text Interpretation:  Sinus tachycardia Probable left atrial enlargement  Left anterior fascicular block No significant change since last tracing  Confirmed by Maryan Rued  MD, Loree Fee ([id number]) on 08/01/2013 6:36:32 PM      MDM   Final diagnoses:  Bronchitis  Tick bite  Dehydration    Patient with a history of URI symptoms for the last approximately 20 days with worsening of symptoms last night developing a fever, nausea, vomiting and persistent productive cough. Patient saw his PCP in the office and was tachycardic and febrile and sent here for further care. Patient does note a tick bite approximately 3 weeks ago but denies any rash and fever and headache just developed today. He does have a prior history of pneumonia x2 last episode approximately 3-4 years ago a history of asthma. He denies significant shortness of breath. No abdominal pain. Mild hemoptysis today after severe episodes of coughing and retching.  Low suspicion for PE. Feel most likely infectious etiology of patient's symptoms. Low suspicion for RMSF.  CBC, i-STAT, chest x-ray, EKG pending. Patient given IV fluids  and nausea medication.  8:08 PM Labs unremarkable except for leukocytosis.  CXR without acute findings.  Will treat with doxy due to recent tick exposure and atypical URI sx.  Will continue to hydrate and po challenge.  8:43 PM Pt po challenged without difficulty.  Feeling much better.  Tachycardia resolved after IVF.  Given doxy and will d/c home with PCP f/u.  Blanchie Dessert, MD 08/01/13 [id number]  Blanchie Dessert, MD 08/01/13 2046

## 2013-08-01 NOTE — Progress Notes (Signed)
Pre visit review using our clinic review tool, if applicable. No additional management support is needed unless otherwise documented below in the visit note. 

## 2013-08-01 NOTE — Patient Instructions (Signed)
Because of the persistent tachycardia; significantly elevated fever; hemoptysis; angioedema symptoms and nausea and vomiting. I recommend you be evaluated in emergency room for possible observation admission.

## 2013-08-01 NOTE — ED Notes (Signed)
Pt ambulatory upon d/c and was d/c with wife at bedside.

## 2013-08-01 NOTE — ED Notes (Signed)
Pt c/o of cough and congestion for several days. Productive cough with  secretions. Seen by PCP today for presenting complaints and was sent here for evaluation of fever, cough, sinus songestion. At times pt has noted blood streaks with cough secretions. EDP Plunkett to evaluate pt upon arrival to acute room 20

## 2013-08-01 NOTE — Progress Notes (Signed)
   Subjective:    Patient ID: Kenneth Sellers, male    DOB: 04/21/1955, 58 y.o.   MRN: [id number]  HPI   His symptoms began 07/22/13 as sore throat and postnasal drainage.  There was no known infectious exposures. Symptoms have progressed and are now associated with diffuse severe headache worse with  paroxysms of coughing. He also has pain below the eyes.  The cough has been productive of yellow and brown secretions but but also green. This morning he had scant production of blood.  The cough has been associated with wheezing and shortness of breath.  He's had fever, chills, and sweats. His temp has been as high as 101.2 last night. This was associated with tachycardia and diaphoresis.  Because of the profound congestion he took Mucinex D last night and had a sensation that his throat would close off. Has been using albuterol for the cough without significant response.  He has never smoked and has no secondhand smoke exposure   Review of Systems  He denies dental pain, nasal purulence, earache, otic discharge, or extrinsic symptoms.  He was bitten by a tick 5/29. He had no fever, rash, or headache associated with that exposure  He has been retching severely and vomited x1. He's concerned about  injuring his surgical repair performed for reflux.  He describes diffuse myalgias and arthralgias.       Objective:   Physical Exam  He appears acutely ill and is profoundly tachycardic. His pulse was checked x3. It varied from 120-132.  He has ptosis of the left eye. There is thinning of the lateral eyebrows.  The sclera are injected without conjunctivitis  There is minor collection of wax in right otic canal.  There is mild erythema and edema of the posterior pharynx. There is no obstruction to airflow over upper airway with hyperventilation. The neck is supple with full range of motion.  He has no lymphadenopathy about the neck or axilla.  Chest is clear to auscultation.  Examination with tactile fremitus and whispered pectoriloquy were also normal.  The tachycardia is associated with a slight flow murmur.  The midline upper scar is well-healed. He has no tenderness, masses, organomegaly.  Straight leg raising is negative  Extremities reveal no cyanosis, clubbing, or edema.  There is a small papule on left lower quadrant which is the site of the tick bite.          Assessment & Plan:  #1 cough productive of purulent sputum and trace hemoptysis  #2 sustained tachycardia  #3 fever  #4 angioedema symptoms following the ingestion of Mucinex D 6/23 #5 history of tick bite 5/29  The major concern is a persistent tachycardia associated with significant fever. Additionally question of angioedema symptoms last night.  00 auscultation chest is nondiagnostic; community-acquired pneumonia must be considered  [facility] spotted fever Lyme disease are also less likely but possible.  I am recommending that he be seen emergency room for CBC and differential, EKG, and chest x-ray.

## 2013-08-03 ENCOUNTER — Encounter: Payer: Self-pay | Admitting: Internal Medicine

## 2013-08-03 LAB — URINE CULTURE
Colony Count: NO GROWTH
Organism ID, Bacteria: NO GROWTH

## 2013-09-05 ENCOUNTER — Encounter: Payer: Self-pay | Admitting: Gastroenterology

## 2013-10-08 ENCOUNTER — Other Ambulatory Visit: Payer: Self-pay | Admitting: Internal Medicine

## 2014-05-14 ENCOUNTER — Ambulatory Visit (INDEPENDENT_AMBULATORY_CARE_PROVIDER_SITE_OTHER): Payer: BC Managed Care – PPO | Admitting: Internal Medicine

## 2014-05-14 ENCOUNTER — Encounter: Payer: Self-pay | Admitting: Internal Medicine

## 2014-05-14 VITALS — BP 142/88 | HR 95 | Temp 98.4°F | Resp 14 | Ht 70.0 in | Wt 198.0 lb

## 2014-05-14 DIAGNOSIS — J018 Other acute sinusitis: Secondary | ICD-10-CM

## 2014-05-14 DIAGNOSIS — J209 Acute bronchitis, unspecified: Secondary | ICD-10-CM

## 2014-05-14 MED ORDER — AMOXICILLIN-POT CLAVULANATE 875-125 MG PO TABS: 1.0000 | ORAL_TABLET | Freq: Two times a day (BID) | ORAL | Status: DC

## 2014-05-14 MED ORDER — HYDROCODONE-HOMATROPINE 5-1.5 MG/5ML PO SYRP: 5.0000 mL | ORAL_SOLUTION | Freq: Four times a day (QID) | ORAL | Status: DC | PRN

## 2014-05-14 NOTE — Progress Notes (Signed)
   Subjective:    Patient ID: Kenneth Sellers, male    DOB: [birth year], 59 y.o.   MRN: [id number]  HPI symptoms began 05/12/14 as sore throat and rhinitis with clear postnasal drainage. As of last night he has had green discharge from his nose as well as yellow/green sputum. The cough has been paroxysmal and associated with wheezing and some shortness of breath.  He's had some watery eyes.  He does have Breo as well as albuterol. He's used albuterol 3 times in past 24 hours. He does have history of asthma.   Review of Systems He's had no sneezing. He denies fever, chills, sweats.    Objective:   Physical Exam  General appearance:Adequately nourished; no acute distress or increased work of breathing is present.    Lymphatic: No  lymphadenopathy about the head, neck, or axilla .  Eyes: No conjunctival inflammation or lid edema is present. There is no scleral icterus.  Ears:  External ear exam shows no significant lesions or deformities. Wax on R; L TM WNL.  Nose:  External nasal examination shows no deformity or inflammation. Nasal mucosa are boggy and moist without lesions or exudates No septal dislocation or deviation.No obstruction to airflow.   Oral exam: Dental hygiene is good; lips and gums are healthy appearing.There is no oropharyngeal erythema or exudate .  Neck:  No deformities, thyromegaly, masses, or tenderness noted.   Supple with full range of motion without pain.   Heart:  S4 tach with regular rhythm. S1 and S2 normal without gallop, murmur, click, rub or other extra sounds.   Lungs:Chest clear to auscultation; no wheezes, rhonchi,rales ,or rubs present.  Extremities:  No cyanosis, edema, or clubbing  noted    Skin: Warm & dry w/o tenting or jaundice. No significant lesions or rash.      Assessment & Plan:  #1 rhinosinusitis #2 significant bronchitis  Plan: Nasal hygiene interventions discussed. See prescription medications

## 2014-05-14 NOTE — Progress Notes (Signed)
Pre visit review using our clinic review tool, if applicable. No additional management support is needed unless otherwise documented below in the visit note. 

## 2014-05-14 NOTE — Patient Instructions (Signed)

## 2014-06-13 ENCOUNTER — Other Ambulatory Visit: Payer: Self-pay | Admitting: Internal Medicine

## 2014-09-16 ENCOUNTER — Other Ambulatory Visit: Payer: Self-pay | Admitting: Internal Medicine

## 2014-09-16 ENCOUNTER — Ambulatory Visit (INDEPENDENT_AMBULATORY_CARE_PROVIDER_SITE_OTHER): Payer: BC Managed Care – PPO | Admitting: Internal Medicine

## 2014-09-16 ENCOUNTER — Other Ambulatory Visit (INDEPENDENT_AMBULATORY_CARE_PROVIDER_SITE_OTHER): Payer: BC Managed Care – PPO

## 2014-09-16 ENCOUNTER — Encounter: Payer: Self-pay | Admitting: Internal Medicine

## 2014-09-16 VITALS — BP 124/84 | HR 81 | Temp 98.0°F | Resp 16 | Ht 70.0 in | Wt 210.0 lb

## 2014-09-16 DIAGNOSIS — Z23 Encounter for immunization: Secondary | ICD-10-CM

## 2014-09-16 DIAGNOSIS — Z0000 Encounter for general adult medical examination without abnormal findings: Secondary | ICD-10-CM

## 2014-09-16 DIAGNOSIS — Z0189 Encounter for other specified special examinations: Secondary | ICD-10-CM | POA: Diagnosis not present

## 2014-09-16 DIAGNOSIS — E782 Mixed hyperlipidemia: Secondary | ICD-10-CM

## 2014-09-16 LAB — CBC WITH DIFFERENTIAL/PLATELET
BASOS PCT: 1 % (ref 0.0–3.0)
Basophils Absolute: 0.1 10*3/uL (ref 0.0–0.1)
EOS ABS: 0.2 10*3/uL (ref 0.0–0.7)
Eosinophils Relative: 3 % (ref 0.0–5.0)
HCT: 49.3 % (ref 39.0–52.0)
Hemoglobin: 16.9 g/dL (ref 13.0–17.0)
LYMPHS PCT: 20 % (ref 12.0–46.0)
Lymphs Abs: 1.4 10*3/uL (ref 0.7–4.0)
MCHC: 34.2 g/dL (ref 30.0–36.0)
MCV: 90.7 fl (ref 78.0–100.0)
MONO ABS: 0.9 10*3/uL (ref 0.1–1.0)
Monocytes Relative: 13.4 % — ABNORMAL HIGH (ref 3.0–12.0)
Neutro Abs: 4.3 10*3/uL (ref 1.4–7.7)
Neutrophils Relative %: 62.6 % (ref 43.0–77.0)
Platelets: 176 10*3/uL (ref 150.0–400.0)
RBC: 5.43 Mil/uL (ref 4.22–5.81)
RDW: 13 % (ref 11.5–15.5)
WBC: 6.8 10*3/uL (ref 4.0–10.5)

## 2014-09-16 LAB — HEPATIC FUNCTION PANEL
ALBUMIN: 4.5 g/dL (ref 3.5–5.2)
ALT: 44 U/L (ref 0–53)
AST: 28 U/L (ref 0–37)
Alkaline Phosphatase: 52 U/L (ref 39–117)
Bilirubin, Direct: 0.1 mg/dL (ref 0.0–0.3)
TOTAL PROTEIN: 7.1 g/dL (ref 6.0–8.3)
Total Bilirubin: 0.6 mg/dL (ref 0.2–1.2)

## 2014-09-16 LAB — BASIC METABOLIC PANEL
BUN: 13 mg/dL (ref 6–23)
CALCIUM: 9.6 mg/dL (ref 8.4–10.5)
CHLORIDE: 102 meq/L (ref 96–112)
CO2: 29 mEq/L (ref 19–32)
Creatinine, Ser: 0.94 mg/dL (ref 0.40–1.50)
GFR: 86.73 mL/min (ref >60.00)
GLUCOSE: 111 mg/dL — AB (ref 70–99)
Potassium: 4.8 mEq/L (ref 3.5–5.1)
Sodium: 140 mEq/L (ref 135–145)

## 2014-09-16 LAB — TSH: TSH: 3.28 u[IU]/mL (ref 0.35–4.50)

## 2014-09-16 NOTE — Patient Instructions (Addendum)
  Your next office appointment will be determined based upon review of your pending labs  and  xrays  Those written interpretation of the lab results and instructions will be transmitted to you by My Chart   Critical results will be called.   Followup as needed for any active or acute issue. Please report any significant change in your symptoms.  Please do not use Q-tips as this simply packs the wax down against he eardrum. Should wax build up occur, please put 2-3 drops of mineral oil in the ear at night and cover the canal with a  cotton ball.In the morning fill the canal with hydrogen peroxide & leave  for 10-15 minutes.Following this shower and use the thinnest washrag available to wick out the wax.

## 2014-09-16 NOTE — Progress Notes (Signed)
   Subjective:    Patient ID: Kenneth Sellers, male    DOB: [birth year], 59 y.o.   MRN: [id number]  HPI He is here for a physical;acute issues denied.   He is on a modified heart healthy diet. He does restrict sodium. He has no regular exercise program but is very active doing yard work on 3 acres of property. He has no active cardiopulmonary symptoms.  He has been compliant with his medicines without adverse effects.  Review of systems is negative except for occasional discomfort in the abdomen related to his ventral hernia which appeared after his surgery for GERD.   Family history includes death of his brother in his 75s of COPD & cardiac disease.  He drinks on occasion. He's never smoked.  He has been compliant with his CPAP. Asthma is well controlled. He rarely uses a rescue inhaler.  Colonoscopy was performed 2007 and revealed diverticulosis. He has no active GI symptoms.  Review of Systems  Chest pain, palpitations, tachycardia, exertional dyspnea, paroxysmal nocturnal dyspnea, claudication or edema are absent. No unexplained weight loss, abdominal pain, significant dyspepsia, dysphagia, melena, rectal bleeding, or persistently small caliber stools. Dysuria, pyuria, hematuria, frequency, nocturia or polyuria are denied. Change in hair, skin, nails denied. No bowel changes of constipation or diarrhea. No intolerance to heat or cold. He does have tinnitus in the left ear intermittently.    Objective:   Physical Exam  Pertinent or positive findings include: He has wax in both canals without impactions. Abdomen is slightly protuberant. Ventral/operative site hernia present. 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 He has slight crepitus in the knees. Genitourinary and digital rectal exams are unremarkable.  General appearance :adequately nourished; in no distress.  Eyes: No conjunctival inflammation or scleral icterus is present.  Oral exam:  Lips and gums are healthy  appearing.There is no oropharyngeal erythema or exudate noted. Dental hygiene is good.  Heart:  Normal rate and regular rhythm. S1 and S2 normal without gallop, murmur, click, rub or other extra sounds    Lungs:Chest clear to auscultation; no wheezes, rhonchi,rales ,or rubs present.No increased work of breathing.   Abdomen: bowel sounds normal, soft and non-tender without masses, or organomegaly noted.  No guarding or rebound.   Vascular : all pulses equal ; no bruits present.  Skin:Warm & dry.  Intact without suspicious lesions or rashes ; no tenting or jaundice   Lymphatic: No lymphadenopathy is noted about the head, neck, axilla, or inguinal areas.   Neuro: Strength, tone & DTRs normal.         Assessment & Plan:  #1 comprehensive physical exam; no acute findings  Plan: see Orders  & Recommendations

## 2014-09-16 NOTE — Progress Notes (Signed)
Pre visit review using our clinic review tool, if applicable. No additional management support is needed unless otherwise documented below in the visit note. 

## 2014-09-18 ENCOUNTER — Other Ambulatory Visit (INDEPENDENT_AMBULATORY_CARE_PROVIDER_SITE_OTHER): Payer: BC Managed Care – PPO

## 2014-09-18 DIAGNOSIS — R739 Hyperglycemia, unspecified: Secondary | ICD-10-CM | POA: Diagnosis not present

## 2014-09-18 LAB — NMR LIPOPROFILE WITH LIPIDS
Cholesterol, Total: 159 mg/dL (ref 100–199)
HDL Particle Number: 37.2 umol/L (ref >=30.5)
HDL SIZE: 8.4 nm — AB (ref >=9.2)
HDL-C: 52 mg/dL (ref >39)
LARGE HDL: 2.8 umol/L — AB (ref >=4.8)
LARGE VLDL-P: 1.5 nmol/L (ref <=2.7)
LDL CALC: 92 mg/dL (ref 0–99)
LDL Particle Number: 1478 nmol/L — ABNORMAL HIGH (ref <1000)
LDL Size: 20.7 nm (ref >=20.8)
LP-IR Score: 53 — ABNORMAL HIGH (ref <=45)
SMALL LDL PARTICLE NUMBER: 747 nmol/L — AB (ref <=527)
Triglycerides: 74 mg/dL (ref 0–149)
VLDL Size: 41.1 nm (ref <=46.6)

## 2014-09-19 LAB — HEMOGLOBIN A1C: Hgb A1c MFr Bld: 5.8 % (ref 4.6–6.5)

## 2014-10-21 ENCOUNTER — Other Ambulatory Visit: Payer: Self-pay | Admitting: Internal Medicine

## 2014-11-21 ENCOUNTER — Ambulatory Visit (INDEPENDENT_AMBULATORY_CARE_PROVIDER_SITE_OTHER): Payer: BC Managed Care – PPO

## 2014-11-21 DIAGNOSIS — Z23 Encounter for immunization: Secondary | ICD-10-CM

## 2014-11-29 ENCOUNTER — Ambulatory Visit: Payer: BC Managed Care – PPO | Admitting: Internal Medicine

## 2015-01-08 ENCOUNTER — Encounter: Payer: Self-pay | Admitting: Adult Health

## 2015-01-08 ENCOUNTER — Ambulatory Visit (INDEPENDENT_AMBULATORY_CARE_PROVIDER_SITE_OTHER): Payer: BC Managed Care – PPO | Admitting: Adult Health

## 2015-01-08 ENCOUNTER — Telehealth: Payer: Self-pay | Admitting: Internal Medicine

## 2015-01-08 VITALS — BP 120/84 | HR 107 | Temp 98.4°F | Ht 70.0 in | Wt 206.3 lb

## 2015-01-08 DIAGNOSIS — J209 Acute bronchitis, unspecified: Secondary | ICD-10-CM | POA: Diagnosis not present

## 2015-01-08 MED ORDER — ALBUTEROL SULFATE HFA 108 (90 BASE) MCG/ACT IN AERS: 1.0000 | INHALATION_SPRAY | Freq: Four times a day (QID) | RESPIRATORY_TRACT | Status: DC | PRN

## 2015-01-08 MED ORDER — METHYLPREDNISOLONE 4 MG PO TBPK: ORAL_TABLET | ORAL | Status: DC

## 2015-01-08 MED ORDER — HYDROCODONE-HOMATROPINE 5-1.5 MG/5ML PO SYRP: 5.0000 mL | ORAL_SOLUTION | Freq: Three times a day (TID) | ORAL | Status: DC | PRN

## 2015-01-08 NOTE — Telephone Encounter (Signed)
Ok with me 

## 2015-01-08 NOTE — Progress Notes (Signed)
Pre visit review using our clinic review tool, if applicable. No additional management support is needed unless otherwise documented below in the visit note. 

## 2015-01-08 NOTE — Progress Notes (Signed)
Subjective:    Patient ID: Kenneth Sellers, male    DOB: March 03, 1955, 59 y.o.   MRN: [id number]  HPI  59 year old male who presents to the office today with an acute issue of dry cough, chest congestion in upper chest, and feeling of chest tightness for one week. Per patient " I get bronchitis every year and it feels like I have it again"  He has been using Hycodan cough syrup at night - which helped but he ran out.    Denies fevers, chills, sinus pain or pressure.   Review of Systems  Constitutional: Negative.   HENT: Positive for congestion. Negative for ear discharge, ear pain, postnasal drip, rhinorrhea, sinus pressure and sore throat.   Respiratory: Positive for cough, chest tightness and shortness of breath. Negative for wheezing and stridor.   Cardiovascular: Negative.   Neurological: Negative.   All other systems reviewed and are negative.  Past Medical History  Diagnosis Date  . Hyperlipidemia   . OSA (obstructive sleep apnea)     PSG 08/26/02 [facility] 59--CPAP 7 cm  . Allergic rhinitis     Dr. Donneta Romberg  . Asthma   . Diverticulosis of colon   . Pneumonia 1988    treated as OP  . Tachycardia 2003    Dr. Mare Ferrari, negative cardiac work up    Social History   Social History  . Marital Status: Married    Spouse Name: N/A  . Number of Children: N/A  . Years of Education: N/A   Occupational History  . retired    Social History Main Topics  . Smoking status: Never Smoker   . Smokeless tobacco: Never Used  . Alcohol Use: Yes     Comment: occasionally  . Drug Use: No  . Sexual Activity: Not on file   Other Topics Concern  . Not on file   Social History Narrative    Past Surgical History  Procedure Laterality Date  . Reflux surgery  1997     Dr Margot Chimes; complicated by herniation post op with retching requiring 2nd surgery  . Rhinoplasty  1983  . Lesion removed l neck; pigmented mole at hairline, non melanoma  07/2003  . Colon tics  03/2005    [facility]     Family History  Problem Relation Age of Onset  . Heart attack Father 76  . Diabetes Mother   . Hypertension Mother   . Stroke Mother 24  . Heart failure Mother   . Diabetes Sister   . Allergies Sister   . Hypertension Brother   . Diabetes Brother     2 brothers  . Heart attack Brother 59    smoker  . Asthma      2 sisters & 2 brothers  . COPD Brother     died in his 59s    No Known Allergies  Current Outpatient Prescriptions on File Prior to Visit  Medication Sig Dispense Refill  . AMBULATORY NON FORMULARY MEDICATION CPAP machine nightly    . Desoximetasone 0.05 % GEL Apply 1 application topically as needed (for eczema). Apply once a day as needed    . ezetimibe-simvastatin (VYTORIN) 10-40 MG per tablet Take 1 tablet by mouth at bedtime.    . Fluticasone Furoate-Vilanterol 100-25 MCG/INH AEPB Inhale 1 Inhaler into the lungs daily.    Marland Kitchen levocetirizine (XYZAL) 5 MG tablet Take 5 mg by mouth every evening.      . montelukast (SINGULAIR) 10 MG tablet Take  10 mg by mouth at bedtime.      . Multiple Vitamin (MULTIVITAMIN) tablet Take 1 tablet by mouth daily.    . ondansetron (ZOFRAN) 4 MG tablet Take 1 tablet (4 mg total) by mouth every 6 (six) hours. 12 tablet 0  . VYTORIN 10-40 MG per tablet TAKE 1 TABLET BY MOUTH EVERY NIGHT AT BEDTIME 90 tablet 3   No current facility-administered medications on file prior to visit.    BP 120/84 mmHg  Pulse 107  Temp(Src) 98.4 F (36.9 C) (Oral)  Ht 5\' 10"  (1.778 m)  Wt 206 lb 4.8 oz (93.577 kg)  BMI 29.60 kg/m2  SpO2 97%       Objective:   Physical Exam  Constitutional: He is oriented to person, place, and time. He appears well-developed and well-nourished. No distress.  HENT:  Head: Normocephalic and atraumatic.  Right Ear: External ear normal.  Left Ear: External ear normal.  Nose: Nose normal.  Mouth/Throat: Oropharynx is clear and moist.  Eyes: Right eye exhibits no discharge. Left eye exhibits no discharge.  Neck:  Normal range of motion. Neck supple.  Cardiovascular: Normal rate, regular rhythm, normal heart sounds and intact distal pulses.  Exam reveals no gallop and no friction rub.   No murmur heard. Pulmonary/Chest: Effort normal. No respiratory distress. He has no wheezes. He has no rales. He exhibits no tenderness.  Prolonged expiratory phase  Lymphadenopathy:    He has no cervical adenopathy.  Neurological: He is alert and oriented to person, place, and time.  Skin: Skin is warm and dry. No rash noted. He is not diaphoretic. No erythema. No pallor.  Psychiatric: He has a normal mood and affect. His behavior is normal. Judgment and thought content normal.  Nursing note and vitals reviewed.     Assessment & Plan:  1. Acute bronchitis, unspecified organism - no current suspicion for pneumonia - albuterol (PROAIR HFA) 108 (90 BASE) MCG/ACT inhaler; Inhale 1-2 puffs into the lungs every 6 (six) hours as needed for wheezing or shortness of breath.  Dispense: 1 Inhaler; Refill: 3 - methylPREDNISolone (MEDROL DOSEPAK) 4 MG TBPK tablet; Take as directed  Dispense: 21 tablet; Refill: 0 - HYDROcodone-homatropine (HYCODAN) 5-1.5 MG/5ML syrup; Take 5 mLs by mouth every 8 (eight) hours as needed for cough.  Dispense: 120 mL; Refill: 0 - Follow up if no improvement in 2-3 days or if symptoms worsen

## 2015-01-08 NOTE — Telephone Encounter (Signed)
Patient is requesting to be taken under your care, as he wishes to continue with a male provider. Is this ok with you? Dr hopper patient

## 2015-01-08 NOTE — Patient Instructions (Addendum)
It was great meeting you today!  Your exam is consistent with bronchitis.   I have sent in a prescription for prednisone, take this as directed.   Use the cough syrup at night and Mucinex during the day.   Acute Bronchitis Bronchitis is inflammation of the airways that extend from the windpipe into the lungs (bronchi). The inflammation often causes mucus to develop. This leads to a cough, which is the most common symptom of bronchitis.  In acute bronchitis, the condition usually develops suddenly and goes away over time, usually in a couple weeks. Smoking, allergies, and asthma can make bronchitis worse. Repeated episodes of bronchitis may cause further lung problems.  CAUSES Acute bronchitis is most often caused by the same virus that causes a cold. The virus can spread from person to person (contagious) through coughing, sneezing, and touching contaminated objects. SIGNS AND SYMPTOMS   Cough.   Fever.   Coughing up mucus.   Body aches.   Chest congestion.   Chills.   Shortness of breath.   Sore throat.  DIAGNOSIS  Acute bronchitis is usually diagnosed through a physical exam. Your health care provider will also ask you questions about your medical history. Tests, such as chest X-rays, are sometimes done to rule out other conditions.  TREATMENT  Acute bronchitis usually goes away in a couple weeks. Oftentimes, no medical treatment is necessary. Medicines are sometimes given for relief of fever or cough. Antibiotic medicines are usually not needed but may be prescribed in certain situations. In some cases, an inhaler may be recommended to help reduce shortness of breath and control the cough. A cool mist vaporizer may also be used to help thin bronchial secretions and make it easier to clear the chest.  HOME CARE INSTRUCTIONS  Get plenty of rest.   Drink enough fluids to keep your urine clear or pale yellow (unless you have a medical condition that requires fluid  restriction). Increasing fluids may help thin your respiratory secretions (sputum) and reduce chest congestion, and it will prevent dehydration.   Take medicines only as directed by your health care provider.  If you were prescribed an antibiotic medicine, finish it all even if you start to feel better.  Avoid smoking and secondhand smoke. Exposure to cigarette smoke or irritating chemicals will make bronchitis worse. If you are a smoker, consider using nicotine gum or skin patches to help control withdrawal symptoms. Quitting smoking will help your lungs heal faster.   Reduce the chances of another bout of acute bronchitis by washing your hands frequently, avoiding people with cold symptoms, and trying not to touch your hands to your mouth, nose, or eyes.   Keep all follow-up visits as directed by your health care provider.  SEEK MEDICAL CARE IF: Your symptoms do not improve after 1 week of treatment.  SEEK IMMEDIATE MEDICAL CARE IF:  You develop an increased fever or chills.   You have chest pain.   You have severe shortness of breath.  You have bloody sputum.   You develop dehydration.  You faint or repeatedly feel like you are going to pass out.  You develop repeated vomiting.  You develop a severe headache. MAKE SURE YOU:   Understand these instructions.  Will watch your condition.  Will get help right away if you are not doing well or get worse.   This information is not intended to replace advice given to you by your health care provider. Make sure you discuss any questions you have  with your health care provider.   Document Released: 03/04/2004 Document Revised: 02/15/2014 Document Reviewed: 07/18/2012 Elsevier Interactive Patient Education [employer].

## 2015-02-19 MED FILL — BREO ELLIPTA 100-25 MCG INH: 100-25 | 30 days supply | Qty: 60 | Fill #2

## 2015-02-20 ENCOUNTER — Ambulatory Visit (INDEPENDENT_AMBULATORY_CARE_PROVIDER_SITE_OTHER): Payer: BC Managed Care – PPO | Admitting: Internal Medicine

## 2015-02-20 ENCOUNTER — Encounter: Payer: Self-pay | Admitting: Internal Medicine

## 2015-02-20 VITALS — BP 136/88 | HR 121 | Temp 99.4°F | Ht 70.0 in | Wt 205.0 lb

## 2015-02-20 DIAGNOSIS — R051 Acute cough: Secondary | ICD-10-CM | POA: Insufficient documentation

## 2015-02-20 DIAGNOSIS — J209 Acute bronchitis, unspecified: Secondary | ICD-10-CM

## 2015-02-20 DIAGNOSIS — R062 Wheezing: Secondary | ICD-10-CM | POA: Diagnosis not present

## 2015-02-20 DIAGNOSIS — R05 Cough: Secondary | ICD-10-CM | POA: Diagnosis not present

## 2015-02-20 DIAGNOSIS — R079 Chest pain, unspecified: Secondary | ICD-10-CM | POA: Diagnosis not present

## 2015-02-20 DIAGNOSIS — R059 Cough, unspecified: Secondary | ICD-10-CM | POA: Insufficient documentation

## 2015-02-20 MED ORDER — LEVOFLOXACIN 500 MG PO TABS: 500.0000 mg | ORAL_TABLET | Freq: Every day | ORAL | Status: DC

## 2015-02-20 MED ORDER — HYDROCODONE-HOMATROPINE 5-1.5 MG/5ML PO SYRP: 5.0000 mL | ORAL_SOLUTION | Freq: Three times a day (TID) | ORAL | Status: DC | PRN

## 2015-02-20 MED ORDER — PREDNISONE 10 MG PO TABS: ORAL_TABLET | ORAL | Status: DC

## 2015-02-20 MED ORDER — ONDANSETRON HCL 4 MG PO TABS: 4.0000 mg | ORAL_TABLET | Freq: Four times a day (QID) | ORAL | Status: DC

## 2015-02-20 MED ORDER — METHYLPREDNISOLONE ACETATE 80 MG/ML IJ SUSP
80.0000 mg | Freq: Once | INTRAMUSCULAR | Status: AC
  Administered 2015-02-20: 80 mg via INTRAMUSCULAR

## 2015-02-20 MED ORDER — ALBUTEROL SULFATE HFA 108 (90 BASE) MCG/ACT IN AERS: 1.0000 | INHALATION_SPRAY | Freq: Four times a day (QID) | RESPIRATORY_TRACT | Status: DC | PRN

## 2015-02-20 NOTE — Progress Notes (Signed)
Pre visit review using our clinic review tool, if applicable. No additional management support is needed unless otherwise documented below in the visit note. 

## 2015-02-20 NOTE — Progress Notes (Signed)
Subjective:    Patient ID: Kenneth Sellers, male    DOB: 1955/08/23, 60 y.o.   MRN: [id number]  HPI  Here with acute onset mild to mod 2-3 days ST, HA, general weakness and malaise, with prod cough greenish sputum, but Pt denies chest pain, increased sob or doe, wheezing, orthopnea, PND, increased LE swelling, palpitations, dizziness or syncope, except for onset wheezing last PM, as well as onset yesterday of right sided CP, post lat chest, sharp, mild to mod, pleuritic, no radiation, no diaphoresis, n/v, palp or dizziness. Past Medical History  Diagnosis Date  . Hyperlipidemia   . OSA (obstructive sleep apnea)     PSG 08/26/02 AHI 59--CPAP 7 cm  . Allergic rhinitis     Dr. Donneta Romberg  . Asthma   . Diverticulosis of colon   . Pneumonia 1988    treated as OP  . Tachycardia 2003    Dr. Mare Ferrari, negative cardiac work up   Past Surgical History  Procedure Laterality Date  . Reflux surgery  1997     Dr Margot Chimes; complicated by herniation post op with retching requiring 2nd surgery  . Rhinoplasty  1983  . Lesion removed l neck; pigmented mole at hairline, non melanoma  07/2003  . Colon tics  03/2005    [facility]    reports that he has never smoked. He has never used smokeless tobacco. He reports that he drinks alcohol. He reports that he does not use illicit drugs. family history includes Allergies in his sister; COPD in his brother; Diabetes in his brother, mother, and sister; Heart attack (age of onset: 51) in his brother; Heart attack (age of onset: 24) in his father; Heart failure in his mother; Hypertension in his brother and mother; Stroke (age of onset: 63) in his mother. No Known Allergies Current Outpatient Prescriptions on File Prior to Visit  Medication Sig Dispense Refill  . AMBULATORY NON FORMULARY MEDICATION CPAP machine nightly    . Desoximetasone 0.05 % GEL Apply 1 application topically as needed (for eczema). Apply once a day as needed    . ezetimibe-simvastatin (VYTORIN)  10-40 MG per tablet Take 1 tablet by mouth at bedtime.    . Fluticasone Furoate-Vilanterol 100-25 MCG/INH AEPB Inhale 1 Inhaler into the lungs daily.    Marland Kitchen levocetirizine (XYZAL) 5 MG tablet Take 5 mg by mouth every evening.      . methylPREDNISolone (MEDROL DOSEPAK) 4 MG TBPK tablet Take as directed 21 tablet 0  . montelukast (SINGULAIR) 10 MG tablet Take 10 mg by mouth at bedtime.      . Multiple Vitamin (MULTIVITAMIN) tablet Take 1 tablet by mouth daily.     No current facility-administered medications on file prior to visit.   Review of Systems  Constitutional: Negative for unusual diaphoresis or night sweats HENT: Negative for ringing in ear or discharge Eyes: Negative for double vision or worsening visual disturbance.  Respiratory: Negative for choking and stridor.   Gastrointestinal: Negative for vomiting or other signifcant bowel change Genitourinary: Negative for hematuria or change in urine volume.  Musculoskeletal: Negative for other MSK pain or swelling Skin: Negative for color change and worsening wound.  Neurological: Negative for tremors and numbness other than noted  Psychiatric/Behavioral: Negative for decreased concentration or agitation other than above       Objective:   Physical Exam BP 136/88 mmHg  Pulse 121  Temp(Src) 99.4 F (37.4 C) (Oral)  Ht 5\' 10"  (1.778 m)  Wt 205 lb (92.987  kg)  BMI 29.41 kg/m2  SpO2 95% VS noted, mild ill Constitutional: Pt appears in no significant distress HENT: Head: NCAT.  Right Ear: External ear normal.  Left Ear: External ear normal.  Eyes: . Pupils are equal, round, and reactive to light. Conjunctivae and EOM are normal Neck: Normal range of motion. Neck supple. with bilat submandib LA tender Cardiovascular: Normal rate and regular rhythm.   Pulmonary/Chest: Effort normal and breath sounds without rales but mild bilat scattered wheezing.  Abd:  Soft, NT, ND, + BS MSK: tender right postlat chest wall about t8, post  axillary line, without swelling or rash Neurological: Pt is alert. Not confused , motor grossly intact Skin: Skin is warm. No rash, no LE edema Psychiatric: Pt behavior is normal. No agitation.     Assessment & Plan:

## 2015-02-20 NOTE — Patient Instructions (Signed)
You had the steroid shot today  Please take all new medication as prescribed - the antibiotic, cough medicine, prednisone  Please continue all other medications as before, and refills have been done if requested - the inhaler  Please have the pharmacy call with any other refills you may need.  Please keep your appointments with your specialists as you may have planned  Please go to the XRAY Department in the Basement (go straight as you get off the elevator) for the x-ray testing tomorrow  You will be contacted by phone if any changes need to be made immediately.  Otherwise, you will receive a letter about your results with an explanation, but please check with MyChart first.  Please remember to sign up for MyChart if you have not done so, as this will be important to you in the future with finding out test results, communicating by private email, and scheduling acute appointments online when needed.

## 2015-02-21 ENCOUNTER — Ambulatory Visit (INDEPENDENT_AMBULATORY_CARE_PROVIDER_SITE_OTHER)
Admission: RE | Admit: 2015-02-21 | Discharge: 2015-02-21 | Disposition: A | Discharge status: Home / Self Care | Payer: BC Managed Care – PPO | Source: Ambulatory Visit | Attending: Internal Medicine | Admitting: Internal Medicine

## 2015-02-21 DIAGNOSIS — R079 Chest pain, unspecified: Secondary | ICD-10-CM

## 2015-02-21 DIAGNOSIS — R05 Cough: Secondary | ICD-10-CM

## 2015-02-21 DIAGNOSIS — R062 Wheezing: Secondary | ICD-10-CM | POA: Diagnosis not present

## 2015-02-21 DIAGNOSIS — R059 Cough, unspecified: Secondary | ICD-10-CM

## 2015-02-21 MED FILL — predniSONE 10 MG TABS: 10 | 9 days supply | Qty: 18 | Fill #0

## 2015-02-21 MED FILL — VENTOLIN HFA 90 MCG INHALER: 108 (90 BAS | 25 days supply | Qty: 18 | Fill #0

## 2015-02-21 MED FILL — HYDROCODONE-HOMATROPINE SYR: 5-1.5 | 12 days supply | Qty: 180 | Fill #0

## 2015-02-21 MED FILL — ONDANSETRON HCL 4 MG TABLET: 4 | 7 days supply | Qty: 30 | Fill #0

## 2015-02-22 NOTE — Assessment & Plan Note (Signed)
Mild to mod, c/w bronchitis vs pna, for cxr, also for antibx course, cough med prn,  to f/u any worsening symptoms or concerns 

## 2015-02-22 NOTE — Assessment & Plan Note (Signed)
Right pleuritic pain, ? MSk vs other , also for cxr, tylenol prn

## 2015-02-22 NOTE — Assessment & Plan Note (Signed)
Mild to mod, for depomedrol IM, predpac asd, inhaler prn, to f/u any worsening symptoms or concerns

## 2015-03-04 DIAGNOSIS — J3089 Other allergic rhinitis: Secondary | ICD-10-CM | POA: Diagnosis not present

## 2015-03-04 DIAGNOSIS — J209 Acute bronchitis, unspecified: Secondary | ICD-10-CM | POA: Diagnosis not present

## 2015-03-04 DIAGNOSIS — L309 Dermatitis, unspecified: Secondary | ICD-10-CM | POA: Diagnosis not present

## 2015-03-04 DIAGNOSIS — J4530 Mild persistent asthma, uncomplicated: Secondary | ICD-10-CM | POA: Diagnosis not present

## 2015-03-04 DIAGNOSIS — J019 Acute sinusitis, unspecified: Secondary | ICD-10-CM | POA: Diagnosis not present

## 2015-03-04 DIAGNOSIS — J301 Allergic rhinitis due to pollen: Secondary | ICD-10-CM | POA: Diagnosis not present

## 2015-03-04 MED FILL — levoFLOXacin 750 MG TABS: 750 | 5 days supply | Qty: 5 | Fill #0

## 2015-03-04 MED FILL — LEVOCETIRIZINE 5 MG TABLET: 5 | 30 days supply | Qty: 30 | Fill #0

## 2015-03-04 MED FILL — BREO ELLIPTA 200-25 MCG INH: 200-25 | 30 days supply | Qty: 60 | Fill #0

## 2015-03-04 MED FILL — predniSONE 10 MG TABS: 10 | 4 days supply | Qty: 10 | Fill #0

## 2015-03-04 MED FILL — MONTELUKAST SOD 10 MG TAB: 10 | 30 days supply | Qty: 30 | Fill #0

## 2015-04-11 MED FILL — BREO ELLIPTA 200-25 MCG INH: 200-25 | 30 days supply | Qty: 60 | Fill #1

## 2015-04-11 MED FILL — MONTELUKAST SOD 10 MG TAB: 10 | 30 days supply | Qty: 30 | Fill #1

## 2015-04-11 MED FILL — LEVOCETIRIZINE 5 MG TABLET: 5 | 30 days supply | Qty: 30 | Fill #1

## 2015-04-25 MED FILL — VYTORIN 10-40 MG TABLET: 10-40 | 90 days supply | Qty: 90 | Fill #2

## 2015-05-12 MED FILL — LEVOCETIRIZINE 5 MG TABLET: 5 | 30 days supply | Qty: 30 | Fill #2

## 2015-05-12 MED FILL — MONTELUKAST SOD 10 MG TAB: 10 | 30 days supply | Qty: 30 | Fill #2

## 2015-06-10 MED FILL — LEVOCETIRIZINE 5 MG TABLET: 5 | 30 days supply | Qty: 30 | Fill #3

## 2015-06-10 MED FILL — MONTELUKAST SOD 10 MG TAB: 10 | 30 days supply | Qty: 30 | Fill #3

## 2015-07-04 MED FILL — BREO ELLIPTA 200-25 MCG INH: 200-25 | 30 days supply | Qty: 60 | Fill #2

## 2015-07-09 MED FILL — MONTELUKAST SOD 10 MG TAB: 10 | 30 days supply | Qty: 30 | Fill #4

## 2015-07-09 MED FILL — LEVOCETIRIZINE 5 MG TABLET: 5 | 30 days supply | Qty: 30 | Fill #4

## 2015-07-25 ENCOUNTER — Other Ambulatory Visit: Payer: Self-pay | Admitting: *Deleted

## 2015-07-25 MED ORDER — EZETIMIBE-SIMVASTATIN 10-40 MG PO TABS: 1.0000 | ORAL_TABLET | Freq: Every day | ORAL | Status: DC

## 2015-07-25 MED FILL — EZETIMIBE-SIMVAS 10-40 MG T: 10-40 | 90 days supply | Qty: 90 | Fill #0

## 2015-08-08 MED FILL — LEVOCETIRIZINE 5 MG TABLET: 5 | 30 days supply | Qty: 30 | Fill #5

## 2015-08-08 MED FILL — MONTELUKAST SOD 10 MG TAB: 10 | 30 days supply | Qty: 30 | Fill #5

## 2015-08-18 MED FILL — BREO ELLIPTA 200-25 MCG INH: 200-25 | 30 days supply | Qty: 60 | Fill #3

## 2015-09-08 MED FILL — MONTELUKAST SOD 10 MG TAB: 10 | 30 days supply | Qty: 30 | Fill #6

## 2015-09-08 MED FILL — LEVOCETIRIZINE 5 MG TABLET: 5 | 30 days supply | Qty: 30 | Fill #6

## 2015-09-23 ENCOUNTER — Encounter: Payer: Self-pay | Admitting: Internal Medicine

## 2015-09-23 ENCOUNTER — Other Ambulatory Visit (INDEPENDENT_AMBULATORY_CARE_PROVIDER_SITE_OTHER): Payer: BC Managed Care – PPO

## 2015-09-23 ENCOUNTER — Encounter: Payer: Self-pay | Admitting: Gastroenterology

## 2015-09-23 ENCOUNTER — Ambulatory Visit (INDEPENDENT_AMBULATORY_CARE_PROVIDER_SITE_OTHER): Payer: BC Managed Care – PPO | Admitting: Internal Medicine

## 2015-09-23 VITALS — BP 160/100 | HR 79 | Temp 98.1°F | Resp 16 | Ht 70.0 in | Wt 205.1 lb

## 2015-09-23 DIAGNOSIS — I10 Essential (primary) hypertension: Secondary | ICD-10-CM | POA: Diagnosis not present

## 2015-09-23 DIAGNOSIS — Z1211 Encounter for screening for malignant neoplasm of colon: Secondary | ICD-10-CM | POA: Insufficient documentation

## 2015-09-23 DIAGNOSIS — Z0000 Encounter for general adult medical examination without abnormal findings: Secondary | ICD-10-CM | POA: Diagnosis not present

## 2015-09-23 DIAGNOSIS — H9319 Tinnitus, unspecified ear: Secondary | ICD-10-CM | POA: Insufficient documentation

## 2015-09-23 DIAGNOSIS — G252 Other specified forms of tremor: Secondary | ICD-10-CM | POA: Insufficient documentation

## 2015-09-23 DIAGNOSIS — E785 Hyperlipidemia, unspecified: Secondary | ICD-10-CM | POA: Diagnosis not present

## 2015-09-23 DIAGNOSIS — K439 Ventral hernia without obstruction or gangrene: Secondary | ICD-10-CM | POA: Insufficient documentation

## 2015-09-23 DIAGNOSIS — Z1159 Encounter for screening for other viral diseases: Secondary | ICD-10-CM

## 2015-09-23 DIAGNOSIS — H9313 Tinnitus, bilateral: Secondary | ICD-10-CM | POA: Diagnosis not present

## 2015-09-23 LAB — CBC WITH DIFFERENTIAL/PLATELET
BASOS PCT: 0.8 % (ref 0.0–3.0)
Basophils Absolute: 0 10*3/uL (ref 0.0–0.1)
EOS PCT: 2.8 % (ref 0.0–5.0)
Eosinophils Absolute: 0.2 10*3/uL (ref 0.0–0.7)
HCT: 48.1 % (ref 39.0–52.0)
Hemoglobin: 16.6 g/dL (ref 13.0–17.0)
LYMPHS ABS: 1.3 10*3/uL (ref 0.7–4.0)
Lymphocytes Relative: 20.6 % (ref 12.0–46.0)
MCHC: 34.6 g/dL (ref 30.0–36.0)
MCV: 89.9 fl (ref 78.0–100.0)
MONOS PCT: 13.9 % — AB (ref 3.0–12.0)
Monocytes Absolute: 0.9 10*3/uL (ref 0.1–1.0)
NEUTROS PCT: 61.9 % (ref 43.0–77.0)
Neutro Abs: 3.8 10*3/uL (ref 1.4–7.7)
Platelets: 178 10*3/uL (ref 150.0–400.0)
RBC: 5.35 Mil/uL (ref 4.22–5.81)
RDW: 13 % (ref 11.5–15.5)
WBC: 6.1 10*3/uL (ref 4.0–10.5)

## 2015-09-23 LAB — URINALYSIS, ROUTINE W REFLEX MICROSCOPIC
Bilirubin Urine: NEGATIVE
Hgb urine dipstick: NEGATIVE
KETONES UR: NEGATIVE
LEUKOCYTES UA: NEGATIVE
NITRITE: NEGATIVE
PH: 7 (ref 5.0–8.0)
SPECIFIC GRAVITY, URINE: 1.015 (ref 1.000–1.030)
TOTAL PROTEIN, URINE-UPE24: NEGATIVE
URINE GLUCOSE: NEGATIVE
UROBILINOGEN UA: 0.2 (ref 0.0–1.0)
WBC, UA: NONE SEEN (ref 0–?)

## 2015-09-23 LAB — COMPREHENSIVE METABOLIC PANEL
ALBUMIN: 4.8 g/dL (ref 3.5–5.2)
ALK PHOS: 54 U/L (ref 39–117)
ALT: 33 U/L (ref 0–53)
AST: 26 U/L (ref 0–37)
BUN: 13 mg/dL (ref 6–23)
CHLORIDE: 102 meq/L (ref 96–112)
CO2: 30 mEq/L (ref 19–32)
Calcium: 10 mg/dL (ref 8.4–10.5)
Creatinine, Ser: 0.96 mg/dL (ref 0.40–1.50)
GFR: 84.37 mL/min (ref >60.00)
Glucose, Bld: 108 mg/dL — ABNORMAL HIGH (ref 70–99)
POTASSIUM: 4.1 meq/L (ref 3.5–5.1)
Sodium: 141 mEq/L (ref 135–145)
TOTAL PROTEIN: 7 g/dL (ref 6.0–8.3)
Total Bilirubin: 0.6 mg/dL (ref 0.2–1.2)

## 2015-09-23 LAB — LIPID PANEL
CHOL/HDL RATIO: 3
Cholesterol: 174 mg/dL (ref 0–200)
HDL: 63 mg/dL (ref >39.00)
LDL CALC: 91 mg/dL (ref 0–99)
NonHDL: 111.43
TRIGLYCERIDES: 104 mg/dL (ref 0.0–149.0)
VLDL: 20.8 mg/dL (ref 0.0–40.0)

## 2015-09-23 LAB — PSA: PSA: 0.33 ng/mL (ref 0.10–4.00)

## 2015-09-23 LAB — TSH: TSH: 3.14 u[IU]/mL (ref 0.35–4.50)

## 2015-09-23 NOTE — Progress Notes (Signed)
Pre visit review using our clinic review tool, if applicable. No additional management support is needed unless otherwise documented below in the visit note. 

## 2015-09-23 NOTE — Patient Instructions (Signed)

## 2015-09-23 NOTE — Progress Notes (Signed)
Subjective:  Patient ID: Kenneth Sellers, male    DOB: August 16, 1955  Age: 60 y.o. MRN: [id number]  CC: Hypertension; Hyperlipidemia; and Annual Exam   HPI JADIEL FARB presents for a CPX with many complaints and concerns.  He complains of ringing in both ears for several months, worse on the left than the right. He wants to be referred to an ENT doctor. He complains of intention tremors in both hands that has worsened over the last few months. He complains that he has a hernia on the right side of his abdomen he wants to see a surgeon to see if he can be repaired.   Outpatient Medications Prior to Visit  Medication Sig Dispense Refill  . albuterol (PROAIR HFA) 108 (90 Base) MCG/ACT inhaler Inhale 1-2 puffs into the lungs every 6 (six) hours as needed for wheezing or shortness of breath. 1 Inhaler 5  . AMBULATORY NON FORMULARY MEDICATION CPAP machine nightly    . Desoximetasone 0.05 % GEL Apply 1 application topically as needed (for eczema). Apply once a day as needed    . ezetimibe-simvastatin (VYTORIN) 10-40 MG tablet Take 1 tablet by mouth at bedtime. Yearly physicla due in August must see MD for refills 90 tablet 0  . Fluticasone Furoate-Vilanterol 100-25 MCG/INH AEPB Inhale 1 Inhaler into the lungs daily.    Marland Kitchen levocetirizine (XYZAL) 5 MG tablet Take 5 mg by mouth every evening.      . montelukast (SINGULAIR) 10 MG tablet Take 10 mg by mouth at bedtime.      . Multiple Vitamin (MULTIVITAMIN) tablet Take 1 tablet by mouth daily.    Marland Kitchen HYDROcodone-homatropine (HYCODAN) 5-1.5 MG/5ML syrup Take 5 mLs by mouth every 8 (eight) hours as needed for cough. 180 mL 0  . levofloxacin (LEVAQUIN) 500 MG tablet Take 1 tablet (500 mg total) by mouth daily. 10 tablet 0  . methylPREDNISolone (MEDROL DOSEPAK) 4 MG TBPK tablet Take as directed 21 tablet 0  . ondansetron (ZOFRAN) 4 MG tablet Take 1 tablet (4 mg total) by mouth every 6 (six) hours. 30 tablet 1  . predniSONE (DELTASONE) 10 MG tablet 3 tabs by  mouth per day for 3 days, then 2 tabs per day for 3 days, then 1 tab per day for 3 days, then stop 18 tablet 0   No facility-administered medications prior to visit.     ROS Review of Systems  Constitutional: Negative.  Negative for activity change, appetite change, diaphoresis and fatigue.  HENT: Positive for tinnitus. Negative for congestion, facial swelling, sinus pressure, trouble swallowing and voice change.   Eyes: Negative.  Negative for visual disturbance.  Respiratory: Positive for apnea. Negative for cough, choking, chest tightness, shortness of breath, wheezing and stridor.   Cardiovascular: Negative.  Negative for chest pain, palpitations and leg swelling.  Gastrointestinal: Negative.  Negative for abdominal pain, constipation, diarrhea, nausea and vomiting.  Endocrine: Negative.   Genitourinary: Negative.  Negative for difficulty urinating, discharge, penile swelling, scrotal swelling, testicular pain and urgency.  Musculoskeletal: Negative.  Negative for arthralgias, back pain, myalgias and neck pain.  Skin: Negative.   Allergic/Immunologic: Negative.   Neurological: Positive for tremors. Negative for dizziness, speech difficulty, weakness, light-headedness, numbness and headaches.  Hematological: Negative.  Negative for adenopathy. Does not bruise/bleed easily.  Psychiatric/Behavioral: Negative.     Objective:  BP (!) 160/100 (BP Location: Left Arm, Patient Position: Sitting, Cuff Size: Normal)   Pulse 79   Temp 98.1 F (36.7 C) (Oral)  Resp 16   Ht 5\' 10"  (1.778 m)   Wt 205 lb 2 oz (93 kg)   SpO2 97%   BMI 29.43 kg/m   BP Readings from Last 3 Encounters:  09/23/15 (!) 160/100  02/20/15 136/88  01/08/15 120/84    Wt Readings from Last 3 Encounters:  09/23/15 205 lb 2 oz (93 kg)  02/20/15 205 lb (93 kg)  01/08/15 206 lb 4.8 oz (93.6 kg)    Physical Exam  Constitutional: He is oriented to person, place, and time. No distress.  HENT:  Head: Normocephalic  and atraumatic.  Mouth/Throat: Oropharynx is clear and moist. No oropharyngeal exudate.  Eyes: Conjunctivae are normal. Right eye exhibits no discharge. Left eye exhibits no discharge. No scleral icterus.  Neck: Normal range of motion. Neck supple. No JVD present. No tracheal deviation present. No thyromegaly present.  Cardiovascular: Normal rate, regular rhythm, normal heart sounds and intact distal pulses.  Exam reveals no gallop and no friction rub.   No murmur heard. Pulses:      Carotid pulses are 1+ on the right side, and 1+ on the left side.      Radial pulses are 1+ on the right side, and 1+ on the left side.       Femoral pulses are 1+ on the right side, and 1+ on the left side.      Popliteal pulses are 1+ on the right side, and 1+ on the left side.       Dorsalis pedis pulses are 1+ on the right side, and 1+ on the left side.       Posterior tibial pulses are 1+ on the right side, and 1+ on the left side.  EKG ---  Sinus  Rhythm  WITHIN NORMAL LIMITS   Pulmonary/Chest: Effort normal and breath sounds normal. No stridor. No respiratory distress. He has no wheezes. He has no rales. He exhibits no tenderness.  Abdominal: Soft. Normal appearance and bowel sounds are normal. He exhibits no distension and no mass. There is no hepatosplenomegaly, splenomegaly or hepatomegaly. There is no tenderness. There is no rebound, no guarding and no CVA tenderness. A hernia is present. Hernia confirmed positive in the ventral area. Hernia confirmed negative in the right inguinal area and confirmed negative in the left inguinal area.    Genitourinary: Prostate normal, testes normal and penis normal. Rectal exam shows internal hemorrhoid. Rectal exam shows no external hemorrhoid, no fissure, no mass, no tenderness, anal tone normal and guaiac negative stool. Prostate is not enlarged and not tender. Right testis shows no mass, no swelling and no tenderness. Right testis is descended. Cremasteric reflex  is not absent on the right side. Left testis shows no mass, no swelling and no tenderness. Left testis is descended. Cremasteric reflex is not absent on the left side. Uncircumcised. No phimosis, paraphimosis, hypospadias, penile erythema or penile tenderness. No discharge found.  Musculoskeletal: Normal range of motion. He exhibits no edema, tenderness or deformity.  Lymphadenopathy:    He has no cervical adenopathy.       Right: No inguinal adenopathy present.       Left: No inguinal adenopathy present.  Neurological: He is oriented to person, place, and time.  Skin: Skin is warm and dry. No rash noted. He is not diaphoretic. No erythema. No pallor.  Vitals reviewed.   Lab Results  Component Value Date   WBC 6.1 09/23/2015   HGB 16.6 09/23/2015   HCT 48.1 09/23/2015   PLT  178.0 09/23/2015   GLUCOSE 108 (H) 09/23/2015   CHOL 174 09/23/2015   TRIG 104.0 09/23/2015   HDL 63.00 09/23/2015   LDLCALC 91 09/23/2015   ALT 33 09/23/2015   AST 26 09/23/2015   NA 141 09/23/2015   K 4.1 09/23/2015   CL 102 09/23/2015   CREATININE 0.96 09/23/2015   BUN 13 09/23/2015   CO2 30 09/23/2015   TSH 3.14 09/23/2015   PSA 0.33 09/23/2015   HGBA1C 5.8 09/18/2014    Dg Chest 2 View  Result Date: 02/21/2015 CLINICAL DATA:  Cough, chest congestion, shortness of breath, anterior chest pain, low-grade fever for the past 5 days; history of asthma, nonsmoker. EXAM: CHEST  2 VIEW COMPARISON:  PA and lateral chest x-ray of August 01, 2013 FINDINGS: The lungs are adequately inflated. There is no focal infiltrate. The lung markings are coarse though stable. The heart is top-normal in size. The pulmonary vascularity is normal. The mediastinum is normal in width. There is no pleural effusion. IMPRESSION: Chronic bronchitic-reactive airway disease changes. There is no alveolar pneumonia nor CHF. Electronically Signed   By: David  Martinique M.D.   On: 02/21/2015 08:54    Assessment & Plan:   Siddarth was seen today  for hypertension, hyperlipidemia and annual exam.  Diagnoses and all orders for this visit:  Hyperlipidemia with target LDL less than 130- he has achieved his LDL goal is doing well on statin therapy  Routine general medical examination at a health care facility- exam completed, labs ordered and reviewed, he is due for colonoscopy so I have sent a referral, vaccines were reviewed, patient education material was given. -     Lipid panel; Future -     PSA; Future -     Urinalysis, Routine w reflex microscopic (not at [facility]); Future -     TSH; Future -     CBC with Differential/Platelet; Future -     Comprehensive metabolic panel; Future  Abdominal wall hernia -     Ambulatory referral to General Surgery  Need for hepatitis C screening test -     Hepatitis C antibody; Future  Screen for colon cancer -     Ambulatory referral to Gastroenterology  Essential hypertension- he has developed hypertension, he is asymptomatic, his EKG is negative for LVH, his labs are negative for any secondary causes or end organ damage, at this time he is not willing to start an antihypertensive medication, he agrees to work on his lifestyle modifications and to monitor his blood pressure at home, I have asked him to return in the next few months for blood pressure recheck -     EKG 12-Lead  Tinnitus aurium, bilateral -     Ambulatory referral to ENT  Coarse tremors -     Ambulatory referral to Neurology   I have discontinued Mr. Belt methylPREDNISolone, levofloxacin, predniSONE, HYDROcodone-homatropine, ondansetron, and BREO ELLIPTA. I am also having him maintain his levocetirizine, montelukast, multivitamin, AMBULATORY NON FORMULARY MEDICATION, Desoximetasone, fluticasone furoate-vilanterol, albuterol, and ezetimibe-simvastatin.  Meds ordered this encounter  Medications  . DISCONTD: BREO ELLIPTA 200-25 MCG/INH AEPB    Refill:  6     Follow-up: Return in about 6 weeks (around 11/04/2015).  Scarlette Calico, MD

## 2015-09-24 ENCOUNTER — Encounter: Payer: Self-pay | Admitting: Internal Medicine

## 2015-09-24 LAB — HEPATITIS C ANTIBODY: HCV AB: NEGATIVE

## 2015-10-05 ENCOUNTER — Encounter (HOSPITAL_COMMUNITY): Payer: Self-pay | Admitting: Emergency Medicine

## 2015-10-05 ENCOUNTER — Ambulatory Visit (HOSPITAL_COMMUNITY)
Admission: EM | Admit: 2015-10-05 | Discharge: 2015-10-05 | Disposition: A | Discharge status: Home / Self Care | Payer: BC Managed Care – PPO | Attending: Surgery | Admitting: Surgery

## 2015-10-05 DIAGNOSIS — L259 Unspecified contact dermatitis, unspecified cause: Secondary | ICD-10-CM

## 2015-10-05 MED ORDER — METHYLPREDNISOLONE 4 MG PO TBPK: ORAL_TABLET | ORAL | 0 refills | Status: DC

## 2015-10-05 MED ORDER — METHYLPREDNISOLONE ACETATE 80 MG/ML IJ SUSP
80.0000 mg | Freq: Once | INTRAMUSCULAR | Status: AC
  Administered 2015-10-05: 80 mg via INTRAMUSCULAR

## 2015-10-05 MED ORDER — METHYLPREDNISOLONE ACETATE 80 MG/ML IJ SUSP
INTRAMUSCULAR | Status: AC
  Filled 2015-10-05: qty 1

## 2015-10-05 NOTE — ED Provider Notes (Signed)
[facility]    CSN: [id number] Arrival date & time: 10/05/15  1654  First Provider Contact:  First MD Initiated Contact with Patient 10/05/15 2021        History   Chief Complaint Chief Complaint  Patient presents with  . Rash    HPI Kenneth Sellers is a 60 y.o. male.   HPI Patient presents today with rash.  States that rash started yesterday on left upper arm and left chest. This progressed to chest and back.  Areas are pruritic.  Has used benadryl and calamine lotion without improvement.  Denies fever, chills, nausea, vomiting, cp, sob.  Did do yardwork Friday but was covered up.  Hx of allergies and get shots twice weekly. Last injections Friday.  Past Medical History:  Diagnosis Date  . Allergic rhinitis    Dr. Donneta Romberg  . Asthma   . Diverticulosis of colon   . Hyperlipidemia   . OSA (obstructive sleep apnea)    PSG 08/26/02 AHI 59--CPAP 7 cm  . Pneumonia 1988   treated as OP  . Tachycardia 2003   Dr. Mare Ferrari, negative cardiac work up    Patient Active Problem List   Diagnosis Date Noted  . Routine general medical examination at a health care facility 09/23/2015  . Abdominal wall hernia 09/23/2015  . Screen for colon cancer 09/23/2015  . Essential hypertension 09/23/2015  . 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 Coarse tremors 09/23/2015  . OBSTRUCTIVE SLEEP APNEA 11/07/2009  . ECZEMA, ATOPIC 05/15/2008  . Hyperlipidemia with target LDL less than 130 01/17/2007  . ALLERGIC RHINITIS 01/17/2007  . ASTHMA 01/17/2007    Past Surgical History:  Procedure Laterality Date  . colon tics  03/2005    GI  . lesion removed L neck; pigmented mole at hairline, non melanoma  07/2003  . reflux surgery  1997    Dr Margot Chimes; complicated by herniation post op with retching requiring 2nd surgery  . RHINOPLASTY  1983       Home Medications    Prior to Admission medications   Medication Sig Start Date End Date Taking? Authorizing Provider  albuterol (PROAIR HFA)  108 (90 Base) MCG/ACT inhaler Inhale 1-2 puffs into the lungs every 6 (six) hours as needed for wheezing or shortness of breath. 02/20/15   Biagio Borg, MD  AMBULATORY NON FORMULARY MEDICATION CPAP machine nightly    Historical Provider, MD  Desoximetasone 0.05 % GEL Apply 1 application topically as needed (for eczema). Apply once a day as needed 08/04/12   Hendricks Limes, MD  ezetimibe-simvastatin (VYTORIN) 10-40 MG tablet Take 1 tablet by mouth at bedtime. Yearly physicla due in August must see MD for refills 07/25/15   Biagio Borg, MD  Fluticasone Furoate-Vilanterol 100-25 MCG/INH AEPB Inhale 1 Inhaler into the lungs daily.    Historical Provider, MD  levocetirizine (XYZAL) 5 MG tablet Take 5 mg by mouth every evening.      Historical Provider, MD  methylPREDNISolone (MEDROL DOSEPAK) 4 MG TBPK tablet 6 DAY TAPER AS DIRECTED. 10/05/15   Lanae Crumbly, PA-C  montelukast (SINGULAIR) 10 MG tablet Take 10 mg by mouth at bedtime.      Historical Provider, MD  Multiple Vitamin (MULTIVITAMIN) tablet Take 1 tablet by mouth daily.    Historical Provider, MD    Family History Family History  Problem Relation Age of Onset  . Heart attack Father 41  . Diabetes Mother   . Hypertension Mother   . Stroke Mother  83  . Heart failure Mother   . Diabetes Sister   . Allergies Sister   . Hypertension Brother   . Heart disease Brother   . Diabetes Brother     2 brothers  . Heart attack Brother 53    smoker  . Asthma      2 sisters & 2 brothers  . COPD Brother     died in his 26s  . Cancer Neg Hx   . Early death Neg Hx   . Hyperlipidemia Neg Hx   . Kidney disease Neg Hx     Social History Social History  Substance Use Topics  . Smoking status: Never Smoker  . Smokeless tobacco: Never Used  . Alcohol use 1.2 oz/week    2 Cans of beer per week     Comment: occasionally     Allergies   Review of patient's allergies indicates no known allergies.   Review of Systems Review of Systems    Constitutional: Negative.   HENT: Negative.   Eyes: Negative.   Respiratory: Negative.   Cardiovascular: Negative.   Gastrointestinal: Negative.   Endocrine: Negative.   Genitourinary: Negative.   Musculoskeletal: Negative.   Skin: Positive for rash.  Neurological: Negative.   Psychiatric/Behavioral: Negative.      Physical Exam Triage Vital Signs ED Triage Vitals  Enc Vitals Group     BP 10/05/15 1844 152/94     Pulse Rate 10/05/15 1844 99     Resp 10/05/15 1844 18     Temp 10/05/15 1844 98 F (36.7 C)     Temp Source 10/05/15 1844 Oral     SpO2 10/05/15 1844 98 %     Weight 10/05/15 1844 210 lb (95.3 kg)     Height 10/05/15 1844 5\' 10"  (1.778 m)     Head Circumference --      Peak Flow --      Pain Score 10/05/15 1859 5     Pain Loc --      Pain Edu? --      Excl. in [place]? --    No data found.   Updated Vital Signs BP 152/94 (BP Location: Left Arm)   Pulse 99   Temp 98 F (36.7 C) (Oral)   Resp 18   Ht 5\' 10"  (1.778 m)   Wt 210 lb (95.3 kg)   SpO2 98%   BMI 30.13 kg/m   Visual Acuity Right Eye Distance:   Left Eye Distance:   Bilateral Distance:    Right Eye Near:   Left Eye Near:    Bilateral Near:     Physical Exam  Constitutional: He is oriented to person, place, and time. No distress.  HENT:  Head: Normocephalic and atraumatic.  Eyes: EOM are normal. Pupils are equal, round, and reactive to light.  Neck: Normal range of motion.  Pulmonary/Chest: Effort normal.  Abdominal: He exhibits no distension.  Musculoskeletal: Normal range of motion.  Neurological: He is alert and oriented to person, place, and time.  Skin: Rash (diffuse papular rash left upper arm/axilla, back, chest. areas nontender.  no signs of infection. ) noted.  Psychiatric: He has a normal mood and affect.     UC Treatments / Results  Labs (all labs ordered are listed, but only abnormal results are displayed) Labs Reviewed - No data to display  EKG  EKG  Interpretation None       Radiology No results found.  Procedures Procedures (including critical care time)  Medications Ordered in UC Medications  methylPREDNISolone acetate (DEPO-MEDROL) injection 80 mg (80 mg Intramuscular Given 10/05/15 2119)     Initial Impression / Assessment and Plan / UC Course  I have reviewed the triage vital signs and the nursing notes.  Pertinent labs & imaging results that were available during my care of the patient were reviewed by me and considered in my medical decision making (see chart for details).  Clinical Course    \  Final Clinical Impressions(s) / UC Diagnoses   Final diagnoses:  Contact dermatitis    New Prescriptions Discharge Medication List as of 10/05/2015  9:13 PM    START taking these medications   Details  methylPREDNISolone (MEDROL DOSEPAK) 4 MG TBPK tablet 6 DAY TAPER AS DIRECTED., Print      patient was given depo 80mg  IM injection in clinic.  Will start prednisone taper tomorrow.  Can continue benadryl as needed. Should not drive when taking.  Also continue calamine lotion PRN.  Will follow up with primary care in a few days if not getting better.  If symptoms worsen, he will go immediately to ED.  All questions answered.    Lanae Crumbly, PA-C 10/05/15 2208

## 2015-10-05 NOTE — ED Triage Notes (Signed)
Patient has generalized rash, itches, red skin that started 8/26.   Patient has done a lot of yard work this week. Patient received allergy shots on Friday as he has for the last 15 years. Bumps are at various stages.  Look like insect bites. Red bump with with fluid fill blister to the top of red bump.  Bumps involve torso and extremities.

## 2015-10-09 MED FILL — MONTELUKAST SOD 10 MG TAB: 10 | 30 days supply | Qty: 30 | Fill #0

## 2015-10-09 MED FILL — LEVOCETIRIZINE 5 MG TABLET: 5 | 30 days supply | Qty: 30 | Fill #0

## 2015-10-09 MED FILL — BREO ELLIPTA 200-25 MCG INH: 200-25 | 30 days supply | Qty: 60 | Fill #4

## 2015-10-10 DIAGNOSIS — G4733 Obstructive sleep apnea (adult) (pediatric): Secondary | ICD-10-CM | POA: Diagnosis not present

## 2015-10-10 DIAGNOSIS — H9313 Tinnitus, bilateral: Secondary | ICD-10-CM | POA: Diagnosis not present

## 2015-10-10 DIAGNOSIS — J302 Other seasonal allergic rhinitis: Secondary | ICD-10-CM | POA: Diagnosis not present

## 2015-10-10 DIAGNOSIS — H9193 Unspecified hearing loss, bilateral: Secondary | ICD-10-CM | POA: Diagnosis not present

## 2015-10-15 ENCOUNTER — Ambulatory Visit (INDEPENDENT_AMBULATORY_CARE_PROVIDER_SITE_OTHER): Payer: BC Managed Care – PPO | Admitting: Neurology

## 2015-10-15 ENCOUNTER — Encounter: Payer: Self-pay | Admitting: Neurology

## 2015-10-15 VITALS — BP 130/80 | HR 76 | Ht 70.0 in | Wt 203.0 lb

## 2015-10-15 DIAGNOSIS — G250 Essential tremor: Secondary | ICD-10-CM | POA: Diagnosis not present

## 2015-10-15 NOTE — Progress Notes (Signed)
Kenneth Sellers was seen today in the movement disorders clinic for neurologic consultation at the request of Scarlette Calico, MD.  The consultation is for the evaluation of tremor.  Pt states that it has been going on for about a year.  It is worse over the last few months.  It is worse in the R hand (he is right handed).  It is at rest and not activation.  Uses singulair daily but not new.  Rare albuterol every few months   Tremor: Yes.     How long has it been going on? 1 year  At rest or with activation?  activation  When is it noted the most?  Using it  Fam hx of tremor?  Yes.  , older brother with jaw tremor/hand tremor (he is 72 years older than pt)  Affected by caffeine:  No. (diet dr pepper - drinks 3-4 per day)  Affected by alcohol:  Does not drink enough  Affected by stress:  Yes.    Affected by fatigue:  Yes.   (evenings are worse)  Spills soup if on spoon: may or may not  Spills glass of liquid if full:  No.  Affects ADL's (tying shoes, brushing teeth, etc):  No.  Other Specific Symptoms:  Voice: no change Sleep: wears cpap but has trouble getting to sleep due to tinnitus  Vivid Dreams:  No.  Acting out dreams:  No. Wet Pillows: No. (not significant) Postural symptoms:  No.  Falls?  No. Bradykinesia symptoms: no bradykinesia noted Loss of smell:  No. Loss of taste:  No. Urinary Incontinence:  No. Difficulty Swallowing:  No. Handwriting, micrographia: No. but is tremulous Depression: "some" but nothing "i can't help" Memory changes:  No. Hallucinations:  No.  visual distortions: No. N/V:  No. Lightheaded:  No.  Syncope: No. Diplopia:  No. Dyskinesia:  No.  Neuroimaging has not previously been performed.  ALLERGIES:  No Known Allergies  CURRENT MEDICATIONS:  Outpatient Encounter Prescriptions as of 10/15/2015  Medication Sig  . albuterol (PROAIR HFA) 108 (90 Base) MCG/ACT inhaler Inhale 1-2 puffs into the lungs every 6 (six) hours as needed for wheezing or  shortness of breath.  . AMBULATORY NON FORMULARY MEDICATION CPAP machine nightly  . Desoximetasone 0.05 % GEL Apply 1 application topically as needed (for eczema). Apply once a day as needed  . ezetimibe-simvastatin (VYTORIN) 10-40 MG tablet Take 1 tablet by mouth at bedtime. Yearly physicla due in August must see MD for refills  . Fluticasone Furoate-Vilanterol 100-25 MCG/INH AEPB Inhale 1 Inhaler into the lungs daily.  Marland Kitchen levocetirizine (XYZAL) 5 MG tablet Take 5 mg by mouth every evening.    . montelukast (SINGULAIR) 10 MG tablet Take 10 mg by mouth at bedtime.    . Multiple Vitamin (MULTIVITAMIN) tablet Take 1 tablet by mouth daily.  . [DISCONTINUED] methylPREDNISolone (MEDROL DOSEPAK) 4 MG TBPK tablet 6 DAY TAPER AS DIRECTED.   No facility-administered encounter medications on file as of 10/15/2015.     PAST MEDICAL HISTORY:   Past Medical History:  Diagnosis Date  . Allergic rhinitis    Dr. Donneta Romberg  . Asthma   . Diverticulosis of colon   . Hyperlipidemia   . OSA (obstructive sleep apnea)    PSG 08/26/02 AHI 59--CPAP 7 cm  . Pneumonia 1988   treated as OP  . Tachycardia 2003   Dr. Mare Ferrari, negative cardiac work up    PAST SURGICAL HISTORY:   Past Surgical History:  Procedure  Laterality Date  . colon tics  03/2005   [facility]  . lesion removed L neck; pigmented mole at hairline, non melanoma  07/2003  . reflux surgery  1997    Dr Margot Chimes; complicated by herniation post op with retching requiring 2nd surgery  . RHINOPLASTY  1983    SOCIAL HISTORY:   Social History   Social History  . Marital status: Married    Spouse name: N/A  . Number of children: N/A  . Years of education: N/A   Occupational History  . retired    Social History Main Topics  . Smoking status: Never Smoker  . Smokeless tobacco: Never Used  . Alcohol use 1.2 oz/week    2 Cans of beer per week     Comment: occasionally  . Drug use: No  . Sexual activity: Yes   Other Topics Concern  . Not on  file   Social History Narrative  . No narrative on file    FAMILY HISTORY:   Family Status  Relation Status  . Father Alive  . Mother Deceased  . Sister Alive  . Sister Alive  . Brother Deceased  . Brother Alive  . Brother Alive  .    Marland Kitchen Brother Deceased  . Neg Hx     ROS:  A complete 10 system review of systems was obtained and was unremarkable apart from what is mentioned above.  PHYSICAL EXAMINATION:    VITALS:   Vitals:   10/15/15 0833  BP: 130/80  Pulse: 76  Weight: 203 lb (92.1 kg)  Height: 5\' 10"  (1.778 m)    GEN:  The patient appears stated age and is in NAD. HEENT:  Normocephalic, atraumatic.  The mucous membranes are moist. The superficial temporal arteries are without ropiness or tenderness. CV:  RRR Lungs:  CTAB Neck/HEME:  There are no carotid bruits bilaterally.  Neurological examination:  Orientation: The patient is alert and oriented x3. Fund of knowledge is appropriate.  Recent and remote memory are intact.  Attention and concentration are normal.    Able to name objects and repeat phrases. Cranial nerves: There is good facial symmetry. Pupils are equal round and reactive to light bilaterally. Fundoscopic exam reveals clear margins bilaterally. Extraocular muscles are intact. The visual fields are full to confrontational testing. The speech is fluent and clear. Soft palate rises symmetrically and there is no tongue deviation. Hearing is intact to conversational tone. Sensation: Sensation is intact to light and pinprick throughout (facial, trunk, extremities). Vibration is decreased at the bilateral big toe. There is no extinction with double simultaneous stimulation. There is no sensory dermatomal level identified. Motor: Strength is 5/5 in the bilateral upper and lower extremities.   Shoulder shrug is equal and symmetric.  There is no pronator drift. Deep tendon reflexes: Deep tendon reflexes are 2+/4 at the bilateral biceps, triceps, brachioradialis,  patella and trace at the bilateral achilles. Plantar responses are downgoing bilaterally.  Movement examination: Tone: There is normal tone in the bilateral upper extremities.  The tone in the lower extremities is normal.  Abnormal movements: There is minimal tremor of the outstretched hands that increases slightly with intention.   Coordination:  There is no decremation with RAM's, with any form of RAMS, including alternating supination and pronation of the forearm, hand opening and closing, finger taps, heel taps and toe taps. Gait and Station: The patient has no difficulty arising out of a deep-seated chair without the use of the hands. The patient's stride  length is normal.     Lab Results  Component Value Date   TSH 3.14 09/23/2015     Chemistry      Component Value Date/Time   NA 141 09/23/2015 1008   K 4.1 09/23/2015 1008   CL 102 09/23/2015 1008   CO2 30 09/23/2015 1008   BUN 13 09/23/2015 1008   CREATININE 0.96 09/23/2015 1008      Component Value Date/Time   CALCIUM 10.0 09/23/2015 1008   ALKPHOS 54 09/23/2015 1008   AST 26 09/23/2015 1008   ALT 33 09/23/2015 1008   BILITOT 0.6 09/23/2015 1008        ASSESSMENT/PLAN:  1.  Essential Tremor.  -I see nothing neurodegenerative on his examination.  He has features of mild ET.   We discussed that this can continue to gradually get worse with time.  We discussed that some medications can worsen this, as can caffeine use.  We discussed medication therapy as well as surgical therapy.  Ultimately, the patient decided to hold off on medication.  However, he did tell me that his PCP is contemplating adding BP medication for HTN.  If so, one may want to consider a beta blocker IF asthma is under good control.  He was told to call me if he wants/needs f/u in the future.  Much greater than 50% of this visit was spent in counseling and coordinating care.  Total face to face time:  60 min.    Cc:  Scarlette Calico, MD

## 2015-10-17 DIAGNOSIS — K432 Incisional hernia without obstruction or gangrene: Secondary | ICD-10-CM | POA: Diagnosis not present

## 2015-10-17 DIAGNOSIS — M6208 Separation of muscle (nontraumatic), other site: Secondary | ICD-10-CM | POA: Diagnosis not present

## 2015-10-21 ENCOUNTER — Other Ambulatory Visit (HOSPITAL_COMMUNITY): Payer: Self-pay | Admitting: General Surgery

## 2015-10-21 DIAGNOSIS — K432 Incisional hernia without obstruction or gangrene: Secondary | ICD-10-CM

## 2015-10-22 DIAGNOSIS — R21 Rash and other nonspecific skin eruption: Secondary | ICD-10-CM | POA: Diagnosis not present

## 2015-10-22 DIAGNOSIS — L309 Dermatitis, unspecified: Secondary | ICD-10-CM | POA: Diagnosis not present

## 2015-10-22 DIAGNOSIS — J4530 Mild persistent asthma, uncomplicated: Secondary | ICD-10-CM | POA: Diagnosis not present

## 2015-10-22 DIAGNOSIS — J301 Allergic rhinitis due to pollen: Secondary | ICD-10-CM | POA: Diagnosis not present

## 2015-10-22 DIAGNOSIS — J3089 Other allergic rhinitis: Secondary | ICD-10-CM | POA: Diagnosis not present

## 2015-10-22 MED FILL — predniSONE 10 MG (21) TBPK: 10 | 6 days supply | Qty: 21 | Fill #0

## 2015-10-22 MED FILL — MOMETASONE FUROATE 0.1% CRM: 0.1 | 15 days supply | Qty: 45 | Fill #0

## 2015-10-27 ENCOUNTER — Ambulatory Visit (HOSPITAL_COMMUNITY)
Admission: RE | Admit: 2015-10-27 | Discharge: 2015-10-27 | Disposition: A | Discharge status: Home / Self Care | Payer: BC Managed Care – PPO | Source: Ambulatory Visit | Attending: General Surgery | Admitting: General Surgery

## 2015-10-27 DIAGNOSIS — M5136 Other intervertebral disc degeneration, lumbar region: Secondary | ICD-10-CM | POA: Insufficient documentation

## 2015-10-27 DIAGNOSIS — M5134 Other intervertebral disc degeneration, thoracic region: Secondary | ICD-10-CM | POA: Insufficient documentation

## 2015-10-27 DIAGNOSIS — K432 Incisional hernia without obstruction or gangrene: Secondary | ICD-10-CM | POA: Diagnosis not present

## 2015-10-27 DIAGNOSIS — Z9889 Other specified postprocedural states: Secondary | ICD-10-CM | POA: Insufficient documentation

## 2015-10-27 DIAGNOSIS — I700 Atherosclerosis of aorta: Secondary | ICD-10-CM | POA: Diagnosis not present

## 2015-10-27 DIAGNOSIS — K5730 Diverticulosis of large intestine without perforation or abscess without bleeding: Secondary | ICD-10-CM | POA: Diagnosis not present

## 2015-10-27 DIAGNOSIS — R109 Unspecified abdominal pain: Secondary | ICD-10-CM | POA: Diagnosis not present

## 2015-10-27 MED ORDER — IOPAMIDOL (ISOVUE-300) INJECTION 61%
100.0000 mL | Freq: Once | INTRAVENOUS | Status: AC | PRN
  Administered 2015-10-27: 100 mL via INTRAVENOUS

## 2015-10-30 ENCOUNTER — Ambulatory Visit (AMBULATORY_SURGERY_CENTER): Payer: Self-pay | Admitting: *Deleted

## 2015-10-30 VITALS — Ht 70.0 in | Wt 200.0 lb

## 2015-10-30 DIAGNOSIS — Z1211 Encounter for screening for malignant neoplasm of colon: Secondary | ICD-10-CM

## 2015-10-30 MED ORDER — NA SULFATE-K SULFATE-MG SULF 17.5-3.13-1.6 GM/177ML PO SOLN: 1.0000 | Freq: Once | ORAL | 0 refills | Status: AC

## 2015-10-30 MED FILL — MONTELUKAST SOD 10 MG TAB: 10 | 90 days supply | Qty: 90 | Fill #0

## 2015-10-30 MED FILL — SUPREP BOWEL PREP KIT: 17.5-3.13-1 | 2 days supply | Qty: 354 | Fill #0

## 2015-10-30 NOTE — Progress Notes (Signed)
No egg or soy allergy known to patient  No issues with past sedation with any surgeries  or procedures, no intubation problems  No diet pills per patient No home 02 use per patient  No blood thinners per patient  Pt denies issues with constipation  No A fib or A flutter  emmi video to e mail    

## 2015-10-31 ENCOUNTER — Other Ambulatory Visit: Payer: Self-pay | Admitting: Internal Medicine

## 2015-10-31 MED FILL — EZETIMIBE-SIMVAS 10-40 MG T: 10-40 | 90 days supply | Qty: 90 | Fill #0

## 2015-11-03 DIAGNOSIS — G4733 Obstructive sleep apnea (adult) (pediatric): Secondary | ICD-10-CM | POA: Diagnosis not present

## 2015-11-12 ENCOUNTER — Encounter: Payer: Self-pay | Admitting: Gastroenterology

## 2015-11-12 ENCOUNTER — Ambulatory Visit (AMBULATORY_SURGERY_CENTER): Payer: BC Managed Care – PPO | Admitting: Gastroenterology

## 2015-11-12 VITALS — BP 130/77 | HR 76 | Temp 98.9°F | Resp 13 | Ht 70.0 in | Wt 200.0 lb

## 2015-11-12 DIAGNOSIS — D123 Benign neoplasm of transverse colon: Secondary | ICD-10-CM | POA: Diagnosis not present

## 2015-11-12 DIAGNOSIS — Z1211 Encounter for screening for malignant neoplasm of colon: Secondary | ICD-10-CM

## 2015-11-12 DIAGNOSIS — D124 Benign neoplasm of descending colon: Secondary | ICD-10-CM | POA: Diagnosis not present

## 2015-11-12 DIAGNOSIS — K635 Polyp of colon: Secondary | ICD-10-CM

## 2015-11-12 DIAGNOSIS — D122 Benign neoplasm of ascending colon: Secondary | ICD-10-CM

## 2015-11-12 LAB — HM COLONOSCOPY

## 2015-11-12 MED ORDER — SODIUM CHLORIDE 0.9 % IV SOLN
500.0000 mL | INTRAVENOUS | Status: DC
Start: 2015-11-12 — End: 2016-09-27

## 2015-11-12 NOTE — Patient Instructions (Signed)
Discharge instructions given. Handouts on polyps,diverticulosis and hemorrhoids. Resume previous medications. YOU HAD AN ENDOSCOPIC PROCEDURE TODAY AT THE [facility]:   Refer to the procedure report that was given to you for any specific questions about what was found during the examination.  If the procedure report does not answer your questions, please call your gastroenterologist to clarify.  If you requested that your care partner not be given the details of your procedure findings, then the procedure report has been included in a sealed envelope for you to review at your convenience later.  YOU SHOULD EXPECT: Some feelings of bloating in the abdomen. Passage of more gas than usual.  Walking can help get rid of the air that was put into your GI tract during the procedure and reduce the bloating. If you had a lower endoscopy (such as a colonoscopy or flexible sigmoidoscopy) you may notice spotting of blood in your stool or on the toilet paper. If you underwent a bowel prep for your procedure, you may not have a normal bowel movement for a few days.  Please Note:  You might notice some irritation and congestion in your nose or some drainage.  This is from the oxygen used during your procedure.  There is no need for concern and it should clear up in a day or so.  SYMPTOMS TO REPORT IMMEDIATELY:   Following lower endoscopy (colonoscopy or flexible sigmoidoscopy):  Excessive amounts of blood in the stool  Significant tenderness or worsening of abdominal pains  Swelling of the abdomen that is new, acute  Fever of 100F or higher   For urgent or emergent issues, a gastroenterologist can be reached at any hour by calling [phone].   DIET:  We do recommend a small meal at first, but then you may proceed to your regular diet.  Drink plenty of fluids but you should avoid alcoholic beverages for 24 hours.  ACTIVITY:  You should plan to take it easy for the rest of today and you  should NOT DRIVE or use heavy machinery until tomorrow (because of the sedation medicines used during the test).    FOLLOW UP: Our staff will call the number listed on your records the next business day following your procedure to check on you and address any questions or concerns that you may have regarding the information given to you following your procedure. If we do not reach you, we will leave a message.  However, if you are feeling well and you are not experiencing any problems, there is no need to return our call.  We will assume that you have returned to your regular daily activities without incident.  If any biopsies were taken you will be contacted by phone or by letter within the next 1-3 weeks.  Please call us at [phone] if you have not heard about the biopsies in 3 weeks.    SIGNATURES/CONFIDENTIALITY: You and/or your care partner have signed paperwork which will be entered into your electronic medical record.  These signatures attest to the fact that that the information above on your After Visit Summary has been reviewed and is understood.  Full responsibility of the confidentiality of this discharge information lies with you and/or your care-partner. 

## 2015-11-12 NOTE — Progress Notes (Signed)
Spontaneous respirations throughout. VSS. Resting comfortably. To PACU on room air. Report to  [employer].

## 2015-11-12 NOTE — Progress Notes (Signed)
Called to room to assist during endoscopic procedure.  Patient ID and intended procedure confirmed with present staff. Received instructions for my participation in the procedure from the performing physician.  

## 2015-11-12 NOTE — Op Note (Signed)
[facility] Patient Name: Kenneth Sellers Procedure Date: 11/12/2015 1:17 PM MRN: [id number] Endoscopist: Ladene Artist , MD Age: 60 Referring MD:  Date of Birth: 1956/01/25 Gender: Male Account #: [id number] Procedure:                Colonoscopy Indications:              Screening for colorectal malignant neoplasm Medicines:                Monitored Anesthesia Care Procedure:                Pre-Anesthesia Assessment:                           - Prior to the procedure, a History and Physical                            was performed, and patient medications and                            allergies were reviewed. The patient's tolerance of                            previous anesthesia was also reviewed. The risks                            and benefits of the procedure and the sedation                            options and risks were discussed with the patient.                            All questions were answered, and informed consent                            was obtained. Prior Anticoagulants: The patient has                            taken no previous anticoagulant or antiplatelet                            agents. ASA Grade Assessment: II - A patient with                            mild systemic disease. After reviewing the risks                            and benefits, the patient was deemed in                            satisfactory condition to undergo the procedure.                           After obtaining informed consent, the colonoscope  was passed under direct vision. Throughout the                            procedure, the patient's blood pressure, pulse, and                            oxygen saturations were monitored continuously. The                            Model PCF-H190DL [id number]) scope was introduced                            through the anus and advanced to the the cecum,                            identified by  appendiceal orifice and ileocecal                            valve. The ileocecal valve, appendiceal orifice,                            and rectum were photographed. The quality of the                            bowel preparation was good. The colonoscopy was                            performed without difficulty. The patient tolerated                            the procedure well. Scope In: 1:37:37 PM Scope Out: 1:54:59 PM Scope Withdrawal Time: 0 hours 14 minutes 37 seconds  Total Procedure Duration: 0 hours 17 minutes 22 seconds  Findings:                 The perianal and digital rectal examinations were                            normal.                           A 5 mm polyp was found in the ascending colon. The                            polyp was sessile. The polyp was removed with a                            cold biopsy forceps. Resection and retrieval were                            complete.                           Five sessile polyps were found in the descending  colon (2) and transverse colon (3). The polyps were                            6 to 7 mm in size. These polyps were removed with a                            cold snare. Resection and retrieval were complete.                           Internal hemorrhoids were found during                            retroflexion. The hemorrhoids were small and Grade                            I (internal hemorrhoids that do not prolapse).                           A few small-mouthed diverticula were found in the                            sigmoid colon and descending colon. There was no                            evidence of diverticular bleeding.                           The exam was otherwise without abnormality on                            direct and retroflexion views. Complications:            No immediate complications. Estimated blood loss:                            None. Estimated Blood  Loss:     Estimated blood loss: none. Impression:               - One 5 mm polyp in the ascending colon, removed                            with a cold biopsy forceps. Resected and retrieved.                           - Five 6 to 7 mm polyps in the descending colon and                            in the transverse colon, removed with a cold snare.                            Resected and retrieved.                           - Internal hemorrhoids.                           -  Mild diverticulosis in the sigmoid colon and in                            the descending colon. There was no evidence of                            diverticular bleeding.                           - The examination was otherwise normal on direct                            and retroflexion views. Recommendation:           - Repeat colonoscopy in 3 - 5 years for                            surveillance pending pathology review.                           - Patient has a contact number available for                            emergencies. The signs and symptoms of potential                            delayed complications were discussed with the                            patient. Return to normal activities tomorrow.                            Written discharge instructions were provided to the                            patient.                           - Resume previous diet.                           - Continue present medications.                           - Await pathology results. Ladene Artist, MD 11/12/2015 2:08:18 PM This report has been signed electronically.

## 2015-11-13 ENCOUNTER — Telehealth: Payer: Self-pay | Admitting: *Deleted

## 2015-11-13 NOTE — Telephone Encounter (Signed)
  Follow up Call-  Call back number 11/12/2015  Post procedure Call Back phone  # [phone]  Permission to leave phone message Yes  Some recent data might be hidden     Patient questions:  Do you have a fever, pain , or abdominal swelling? No. Pain Score  0 *  Have you tolerated food without any problems? Yes.    Have you been able to return to your normal activities? Yes.    Do you have any questions about your discharge instructions: Diet   No. Medications  No. Follow up visit  No.  Do you have questions or concerns about your Care? No.  Actions: * If pain score is 4 or above: No action needed, pain <4.

## 2015-11-18 MED FILL — LEVOCETIRIZINE 5 MG TABLET: 5 | 30 days supply | Qty: 30 | Fill #1

## 2015-11-24 ENCOUNTER — Encounter: Payer: Self-pay | Admitting: Gastroenterology

## 2015-11-24 ENCOUNTER — Telehealth: Payer: Self-pay | Admitting: Gastroenterology

## 2015-11-24 LAB — HM COLONOSCOPY

## 2015-11-24 MED FILL — BREO ELLIPTA 200-25 MCG INH: 200-25 | 30 days supply | Qty: 60 | Fill #5

## 2015-11-24 NOTE — Telephone Encounter (Signed)
No answer/machine.  I will continue to try and reach him

## 2015-11-25 ENCOUNTER — Ambulatory Visit (INDEPENDENT_AMBULATORY_CARE_PROVIDER_SITE_OTHER): Payer: BC Managed Care – PPO

## 2015-11-25 DIAGNOSIS — Z23 Encounter for immunization: Secondary | ICD-10-CM | POA: Diagnosis not present

## 2015-11-25 NOTE — Telephone Encounter (Signed)
Left message for patient to call back  

## 2015-11-26 NOTE — Telephone Encounter (Signed)
I reviewed the results with the patient.  He will call back for any additional questions or concerns

## 2015-12-17 MED FILL — LEVOCETIRIZINE 5 MG TABLET: 5 | 30 days supply | Qty: 30 | Fill #2

## 2015-12-25 MED FILL — BREO ELLIPTA 200-25 MCG INH: 200-25 | 30 days supply | Qty: 60 | Fill #6

## 2016-01-19 DIAGNOSIS — J4530 Mild persistent asthma, uncomplicated: Secondary | ICD-10-CM | POA: Diagnosis not present

## 2016-01-19 DIAGNOSIS — J301 Allergic rhinitis due to pollen: Secondary | ICD-10-CM | POA: Diagnosis not present

## 2016-01-19 DIAGNOSIS — J3089 Other allergic rhinitis: Secondary | ICD-10-CM | POA: Diagnosis not present

## 2016-01-19 DIAGNOSIS — L309 Dermatitis, unspecified: Secondary | ICD-10-CM | POA: Diagnosis not present

## 2016-01-19 DIAGNOSIS — J019 Acute sinusitis, unspecified: Secondary | ICD-10-CM | POA: Diagnosis not present

## 2016-01-19 DIAGNOSIS — J209 Acute bronchitis, unspecified: Secondary | ICD-10-CM | POA: Diagnosis not present

## 2016-01-19 MED FILL — BENZONATATE 100 MG CAPSULE: 100 | 7 days supply | Qty: 21 | Fill #0

## 2016-01-19 MED FILL — predniSONE 10 MG TABS: 10 | 4 days supply | Qty: 10 | Fill #0

## 2016-01-19 MED FILL — AZITHROMYCIN 250 MG TABLET: 250 | 5 days supply | Qty: 6 | Fill #0

## 2016-01-19 MED FILL — LEVOCETIRIZINE 5 MG TABLET: 5 | 30 days supply | Qty: 30 | Fill #0

## 2016-01-26 ENCOUNTER — Other Ambulatory Visit: Payer: Self-pay | Admitting: Internal Medicine

## 2016-01-26 MED FILL — EZETIMIBE-SIMVASTATIN 10-20: 10-20 | 90 days supply | Qty: 90 | Fill #0

## 2016-01-27 MED FILL — BREO ELLIPTA 200-25 MCG INH: 200-25 | 30 days supply | Qty: 60 | Fill #0

## 2016-02-18 MED FILL — LEVOCETIRIZINE 5 MG TABLET: 5 | 30 days supply | Qty: 30 | Fill #1

## 2016-02-18 MED FILL — MONTELUKAST SOD 10 MG TAB: 10 | 90 days supply | Qty: 90 | Fill #1

## 2016-02-20 MED FILL — EPINEPHRINE 0.3 MG AUTO-INJ: 0.3 | 30 days supply | Qty: 2 | Fill #0

## 2016-02-27 DIAGNOSIS — L309 Dermatitis, unspecified: Secondary | ICD-10-CM | POA: Diagnosis not present

## 2016-02-27 DIAGNOSIS — J301 Allergic rhinitis due to pollen: Secondary | ICD-10-CM | POA: Diagnosis not present

## 2016-02-27 DIAGNOSIS — J019 Acute sinusitis, unspecified: Secondary | ICD-10-CM | POA: Diagnosis not present

## 2016-02-27 DIAGNOSIS — J4541 Moderate persistent asthma with (acute) exacerbation: Secondary | ICD-10-CM | POA: Diagnosis not present

## 2016-02-27 DIAGNOSIS — J3089 Other allergic rhinitis: Secondary | ICD-10-CM | POA: Diagnosis not present

## 2016-02-27 MED FILL — CEFDINIR 300 MG CAPSULE: 300 | 10 days supply | Qty: 20 | Fill #0

## 2016-02-27 MED FILL — SYMBICORT 160-4.5 MCG INH: 160-4.5 | 30 days supply | Qty: 10 | Fill #0

## 2016-02-27 MED FILL — predniSONE 10 MG TABS: 10 | 7 days supply | Qty: 21 | Fill #0

## 2016-03-24 MED FILL — LEVOCETIRIZINE 5 MG TABLET: 5 | 30 days supply | Qty: 30 | Fill #2

## 2016-03-25 MED FILL — BREO ELLIPTA 200-25 MCG INH: 200-25 | 30 days supply | Qty: 60 | Fill #1

## 2016-04-23 MED FILL — BREO ELLIPTA 200-25 MCG INH: 200-25 | 30 days supply | Qty: 60 | Fill #2

## 2016-04-28 ENCOUNTER — Telehealth: Payer: Self-pay | Admitting: Pulmonary Disease

## 2016-04-28 DIAGNOSIS — G4733 Obstructive sleep apnea (adult) (pediatric): Secondary | ICD-10-CM

## 2016-04-28 NOTE — Telephone Encounter (Signed)
Patient cannot see Nurse Practitioner since he has not been seen by an MD since 2012 -- he will need a new patient 30 minute with you or another MD. Is there any day in particular that we can work this patient in sooner than May with you or would you like the patient seen by another MD? Thanks.

## 2016-04-28 NOTE — Telephone Encounter (Signed)
Can you make appointment with NP sooner, and then order can be set up then.  He would probably need face to face visit before getting new machine.

## 2016-04-28 NOTE — Telephone Encounter (Signed)
Spoke with the pt  He states that he is needing new CPAP machine  His current machine has a problem with the motor, and makes a loud sound, making it impossible to sleep  He was last seen in 2012  I have scheduled him appt with VS for 06/14/16  He is requesting that we go ahead and send order to [facility] for replacement CPAP  Please advise thanks

## 2016-04-29 NOTE — Telephone Encounter (Signed)
LMTCB

## 2016-04-29 NOTE — Telephone Encounter (Signed)
Can schedule him for first available provider, and then have follow up with me after that.

## 2016-04-30 MED FILL — LEVOCETIRIZINE 5 MG TABLET: 5 | 30 days supply | Qty: 30 | Fill #3

## 2016-04-30 NOTE — Telephone Encounter (Signed)
lmtcb X2 for pt. Need to schedule him with any next available consult slot.

## 2016-05-03 NOTE — Telephone Encounter (Signed)
lmom tcb x3   Need to see if another provider has a sooner 30 min slot for sleep consult

## 2016-05-03 NOTE — Telephone Encounter (Signed)
Spoke with pt and informed him of VS message. Pt understood that his insurance may not cover this but wanted [place] to proceed with the order. The order was placed. Pt had no further questions. Nothing further is needed. Pt has kept his appointment he has in May

## 2016-05-03 NOTE — Telephone Encounter (Signed)
Dr. Halford Chessman  Checked schedules the soonest appointment would be 4/27 with another provider. Offered this to pt but he declined stating he is needing something sooner and did not like that he would be without his machine for that long.  Please advise if you wanted to work him in.

## 2016-05-03 NOTE — Telephone Encounter (Signed)
Pt returning call.Kenneth Sellers ° °

## 2016-05-03 NOTE — Telephone Encounter (Signed)
Can send order to arrange for auto CPAP set up with pressure range 5 to 15 cm H2O, heated humidity and mask of choice.  Please inform him that this set up might be denied by his insurance since he didn't have face to face assessment prior to set up.  If there are no issues with CPAP set up, then he can get next available appt with me.

## 2016-05-06 MED FILL — EZETIMIBE-SIMVASTATIN 10-20: 10-20 | 90 days supply | Qty: 90 | Fill #1

## 2016-05-17 MED FILL — MONTELUKAST SOD 10 MG TAB: 10 | 90 days supply | Qty: 90 | Fill #0

## 2016-05-27 ENCOUNTER — Encounter: Payer: Self-pay | Admitting: Pulmonary Disease

## 2016-05-27 ENCOUNTER — Ambulatory Visit (INDEPENDENT_AMBULATORY_CARE_PROVIDER_SITE_OTHER): Payer: BC Managed Care – PPO | Admitting: Pulmonary Disease

## 2016-05-27 VITALS — BP 150/100 | HR 102 | Ht 70.0 in | Wt 201.6 lb

## 2016-05-27 DIAGNOSIS — G4733 Obstructive sleep apnea (adult) (pediatric): Secondary | ICD-10-CM | POA: Diagnosis not present

## 2016-05-27 NOTE — Patient Instructions (Signed)
  It was a pleasure taking care of you today!  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   Please call the office if you do NOT receive your machine in the next 1-2 weeks.   Please make sure you use your CPAP device everytime you sleep.  We will monitor the usage of your machine per your insurance requirement.  Your insurance company may take the machine from you if you are not using it regularly.   Please clean the mask, tubings, filter, water reservoir with soapy water every week.  Please use distilled water for the water reservoir.   Please call the office or your machine provider (DME company) if you are having issues with the device.   Return to clinic in 6-8 weeks with NP or APP.

## 2016-05-27 NOTE — Assessment & Plan Note (Signed)
Patient had a sleep lab study in 2004 in [place]  which showed AHI of 59, optimal on cpap 7 cm water. He was very symptomatic with hypersomnia, snoring, fatigue, gasping, choking, unrefreshed sleep.  He ended up getting a cpap machine in 2004.  He felt better with cpap use.  More energy, less sleepiness.  He received a new cpap machine in 2012. He has been faithfully wearing his cpap machine.  Felt better using it, more energy, less sleepiness.  His machine broke in 03/2016.  He brought it to his DME and he was told he needed to be seen by a  Sleep MD to get an order for a new cpap machine.  The earliest appointment available was today.   Since his machine broke, he has remained very symptomatic with snoring, gasping, choking, hypersomnia.  Hypersomnia has affected his fxnality.  He is miserable and tired all day.   Plan :  We extensively discussed the diagnosis, pathophysiology, and treatment options for Obstructive Sleep Apnea (OSA).  We discussed treatment options for OSA including CPAP, BiPaP, as well as surgical options and oral devices.   We will start patient on  autocpap 5-15 cm water.  There has been NO break in treatment.  His machine broke in 03/2016.  When he was using it, he felt better with cpap.  He remains symptomatic now since not using cpap.   Patient was instructed to call the office if he/she has not received the PAP device in 1-2 weeks.  Patient was instructed to have mask, tubings, filter, reservoir cleaned at least once a week with soapy water.  Patient was instructed to call the office if he/she is having issues with the PAP device.    I advised patient to obtain sufficient amount of sleep --  7 to 8 hours at least in a 24 hr period.  Patient was advised to follow good sleep hygiene.  Patient was advised NOT to engage in activities requiring concentration and/or vigilance if he/she is and  sleepy.  Patient is NOT to drive if he/she is sleepy.

## 2016-05-27 NOTE — Progress Notes (Signed)
Subjective:    Patient ID: Kenneth Sellers, male    DOB: 01/29/1956, 61 y.o.   MRN: [id number]  HPI   This is the case of Kenneth Sellers, 61 y.o. Male, who made this appointment as he needed a new cpap machine.   Patient is a non smoker.  He has asthma for which he gets asthma shots weekly. Asthma has been stable.   Patient had a sleep lab study in 2004 in [place]  which showed AHI of 59, optimal on cpap 7 cm water. He was very symptomatic with hypersomnia, snoring, fatigue, gasping, choking, unrefreshed sleep.  He ended up getting a cpap machine in 2004.  He felt better with cpap use.  More energy, less sleepiness.  He received a new cpap machine in 2012. He has been faithfully wearing his cpap machine.  Felt better using it, more energy, less sleepiness.  His machine broke in 03/2016.  He brought it to his DME and he was told he needed to be seen by a  Sleep MD to get an order for a new cpap machine.  The earliest appointment available was today.   Since his machine broke, he has remained very symptomatic with snoring, gasping, choking, hypersomnia.  Hypersomnia has affected his fxnality.  He is miserable and tired all day.      Review of Systems  Constitutional: Negative.  Negative for fever and unexpected weight change.  HENT: Positive for ear pain. Negative for congestion, dental problem, nosebleeds, postnasal drip, rhinorrhea, sinus pressure, sneezing, sore throat and trouble swallowing.   Eyes: Positive for redness and itching.  Respiratory: Positive for shortness of breath. Negative for cough, chest tightness and wheezing.   Cardiovascular: Positive for palpitations. Negative for leg swelling.  Gastrointestinal: Negative.  Negative for nausea and vomiting.  Genitourinary: Negative.  Negative for dysuria.  Musculoskeletal: Negative.  Negative for joint swelling.  Skin: Negative for rash.  Neurological: Positive for headaches.  Hematological: Negative.  Does not bruise/bleed  easily.  Psychiatric/Behavioral: Negative.  Negative for dysphoric mood. The patient is not nervous/anxious.    Past Medical History:  Diagnosis Date  . Allergic rhinitis    Dr. Donneta Romberg  . Allergy   . Arthritis    hands  . Asthma   . Diverticulosis of colon   . Hyperlipidemia   . Neuromuscular disorder ([place])    essential tremors  . OSA (obstructive sleep apnea)    PSG 08/26/02 AHI 59--CPAP 7 cm  . Pneumonia 1988   treated as OP  . Sleep apnea    wears cpap   . Tachycardia 2003   Dr. Mare Ferrari, negative cardiac work up     Family History  Problem Relation Age of Onset  . Heart attack Father 72  . Diabetes Mother   . Hypertension Mother   . Stroke Mother 48  . Heart failure Mother   . Diabetes Sister   . Allergies Sister   . Hypertension Brother   . Heart disease Brother   . Diabetes Brother     2 brothers  . Heart attack Brother 48    smoker  . COPD Brother     died in his 57s  . Arthritis Sister   . Cancer Neg Hx   . Early death Neg Hx   . Hyperlipidemia Neg Hx   . Kidney disease Neg Hx   . Colon cancer Neg Hx   . Colon polyps Neg Hx   . Esophageal cancer Neg Hx   .  Rectal cancer Neg Hx   . Stomach cancer Neg Hx      Past Surgical History:  Procedure Laterality Date  . colon tics  03/2005   [facility]  . COLONOSCOPY  03/2005  . lesion removed L neck; pigmented mole at hairline, non melanoma  07/2003  . NISSEN FUNDOPLICATION  5621    Dr Margot Chimes; complicated by herniation post op with retching requiring 2nd surgery  . RHINOPLASTY  1983    Social History   Social History  . Marital status: Married    Spouse name: N/A  . Number of children: N/A  . Years of education: N/A   Occupational History  . retired     Pharmacist, hospital   Social History Main Topics  . Smoking status: Never Smoker  . Smokeless tobacco: Never Used  . Alcohol use 1.2 oz/week    2 Cans of beer per week     Comment: occasionally  . Drug use: No  . Sexual activity: Yes   Other  Topics Concern  . Not on file   Social History Narrative  . No narrative on file     No Known Allergies   Outpatient Medications Prior to Visit  Medication Sig Dispense Refill  . albuterol (PROAIR HFA) 108 (90 Base) MCG/ACT inhaler Inhale 1-2 puffs into the lungs every 6 (six) hours as needed for wheezing or shortness of breath. 1 Inhaler 5  . Desoximetasone 0.05 % GEL Apply 1 application topically as needed (for eczema). Apply once a day as needed    . ezetimibe-simvastatin (VYTORIN) 10-40 MG tablet TAKE 1 TABLET BY MOUTH AT BEDTIME. YEARLY PHYSICAL DUE IN AUGUST MUST SEE MD FOR REFILLS 90 tablet 1  . levocetirizine (XYZAL) 5 MG tablet Take 5 mg by mouth every evening.      . mometasone (ELOCON) 0.1 % cream Apply 1 application topically daily.    . montelukast (SINGULAIR) 10 MG tablet Take 10 mg by mouth at bedtime.      . Multiple Vitamin (MULTIVITAMIN) tablet Take 1 tablet by mouth daily.    . AMBULATORY NON FORMULARY MEDICATION CPAP machine nightly    . Fluticasone Furoate-Vilanterol 100-25 MCG/INH AEPB Inhale 1 Inhaler into the lungs daily.     Facility-Administered Medications Prior to Visit  Medication Dose Route Frequency Provider Last Rate Last Dose  . 0.9 %  sodium chloride infusion  500 mL Intravenous Continuous Ladene Artist, MD       Meds ordered this encounter  Medications  . fluticasone furoate-vilanterol (BREO ELLIPTA) 200-25 MCG/INH AEPB    Sig: Inhale 1 puff into the lungs daily.         Objective:   Physical Exam  Vitals:  Vitals:   05/27/16 1451  BP: (!) 150/100  Pulse: (!) 102  SpO2: 97%  Weight: 201 lb 9.6 oz (91.4 kg)  Height: 5\' 10"  (1.778 m)    Constitutional/General:  Pleasant, well-nourished, well-developed, not in any distress,  Comfortably seating.  Well kempt  Body mass index is 28.93 kg/m. Wt Readings from Last 3 Encounters:  05/27/16 201 lb 9.6 oz (91.4 kg)  11/12/15 200 lb (90.7 kg)  10/30/15 200 lb (90.7 kg)     HEENT:  Pupils equal and reactive to light and accommodation. Anicteric sclerae. Normal nasal mucosa.   No oral  lesions,  mouth clear,  oropharynx clear, no postnasal drip. (-) Oral thrush. No dental caries.  Airway - Mallampati class III  Neck: No masses. Midline trachea. No JVD, (-)  LAD. (-) bruits appreciated.  Respiratory/Chest: Grossly normal chest. (-) deformity. (-) Accessory muscle use.  Symmetric expansion. (-) Tenderness on palpation.  Resonant on percussion.  Diminished BS on both lower lung zones. (-) wheezing, crackles, rhonchi (-) egophony  Cardiovascular: Regular rate and  rhythm, heart sounds normal, no murmur or gallops, no peripheral edema  Gastrointestinal:  Normal bowel sounds. Soft, non-tender. No hepatosplenomegaly.  (-) masses.   Musculoskeletal:  Normal muscle tone. Normal gait.   Extremities: Grossly normal. (-) clubbing, cyanosis.  (-) edema  Skin: (-) rash,lesions seen.   Neurological/Psychiatric : alert, oriented to time, place, person. Normal mood and affect          Assessment & Plan:  OBSTRUCTIVE SLEEP APNEA Patient had a sleep lab study in 2004 in [place]  which showed AHI of 59, optimal on cpap 7 cm water. He was very symptomatic with hypersomnia, snoring, fatigue, gasping, choking, unrefreshed sleep.  He ended up getting a cpap machine in 2004.  He felt better with cpap use.  More energy, less sleepiness.  He received a new cpap machine in 2012. He has been faithfully wearing his cpap machine.  Felt better using it, more energy, less sleepiness.  His machine broke in 03/2016.  He brought it to his DME and he was told he needed to be seen by a  Sleep MD to get an order for a new cpap machine.  The earliest appointment available was today.   Since his machine broke, he has remained very symptomatic with snoring, gasping, choking, hypersomnia.  Hypersomnia has affected his fxnality.  He is miserable and tired all day.   Plan :  We extensively  discussed the diagnosis, pathophysiology, and treatment options for Obstructive Sleep Apnea (OSA).  We discussed treatment options for OSA including CPAP, BiPaP, as well as surgical options and oral devices.   We will start patient on  autocpap 5-15 cm water.  There has been NO break in treatment.  His machine broke in 03/2016.  When he was using it, he felt better with cpap.  He remains symptomatic now since not using cpap.   Patient was instructed to call the office if he/she has not received the PAP device in 1-2 weeks.  Patient was instructed to have mask, tubings, filter, reservoir cleaned at least once a week with soapy water.  Patient was instructed to call the office if he/she is having issues with the PAP device.    I advised patient to obtain sufficient amount of sleep --  7 to 8 hours at least in a 24 hr period.  Patient was advised to follow good sleep hygiene.  Patient was advised NOT to engage in activities requiring concentration and/or vigilance if he/she is and  sleepy.  Patient is NOT to drive if he/she is sleepy.         Return to clinic in 4-6 weeks     J. Shirl Harris, MD 05/27/2016   9:15 PM Pulmonary and [place] Pager: [phone] Office: [phone], Fax: [phone]

## 2016-05-28 DIAGNOSIS — L309 Dermatitis, unspecified: Secondary | ICD-10-CM | POA: Diagnosis not present

## 2016-05-28 DIAGNOSIS — J3089 Other allergic rhinitis: Secondary | ICD-10-CM | POA: Diagnosis not present

## 2016-05-28 DIAGNOSIS — J301 Allergic rhinitis due to pollen: Secondary | ICD-10-CM | POA: Diagnosis not present

## 2016-05-28 DIAGNOSIS — J4541 Moderate persistent asthma with (acute) exacerbation: Secondary | ICD-10-CM | POA: Diagnosis not present

## 2016-06-01 ENCOUNTER — Institutional Professional Consult (permissible substitution): Payer: BC Managed Care – PPO | Admitting: Pulmonary Disease

## 2016-06-07 DIAGNOSIS — G4733 Obstructive sleep apnea (adult) (pediatric): Secondary | ICD-10-CM | POA: Diagnosis not present

## 2016-06-14 ENCOUNTER — Institutional Professional Consult (permissible substitution): Payer: BC Managed Care – PPO | Admitting: Pulmonary Disease

## 2016-06-15 MED FILL — BREO ELLIPTA 200-25 MCG INH: 200-25 | 30 days supply | Qty: 60 | Fill #3

## 2016-06-15 MED FILL — MOMETASONE FUROATE 0.1% CRM: 0.1 | 15 days supply | Qty: 45 | Fill #1

## 2016-06-25 MED FILL — LEVOCETIRIZINE 5 MG TABLET: 5 | 30 days supply | Qty: 30 | Fill #4

## 2016-07-15 MED FILL — BREO ELLIPTA 200-25 MCG INH: 200-25 | 30 days supply | Qty: 60 | Fill #4

## 2016-07-26 MED FILL — LEVOCETIRIZINE 5 MG TABLET: 5 | 30 days supply | Qty: 30 | Fill #5

## 2016-07-30 ENCOUNTER — Other Ambulatory Visit: Payer: Self-pay | Admitting: Internal Medicine

## 2016-07-30 MED FILL — EZETIMIBE-SIMVASTATIN 10-20: 10-20 | 30 days supply | Qty: 30 | Fill #0

## 2016-08-16 MED FILL — BREO ELLIPTA 200-25 MCG INH: 200-25 | 30 days supply | Qty: 60 | Fill #5

## 2016-08-16 MED FILL — MONTELUKAST SOD 10 MG TAB: 10 | 30 days supply | Qty: 30 | Fill #0

## 2016-08-23 ENCOUNTER — Encounter: Payer: Self-pay | Admitting: Pulmonary Disease

## 2016-08-23 ENCOUNTER — Ambulatory Visit (INDEPENDENT_AMBULATORY_CARE_PROVIDER_SITE_OTHER): Payer: BC Managed Care – PPO | Admitting: Pulmonary Disease

## 2016-08-23 VITALS — BP 128/80 | HR 94

## 2016-08-23 DIAGNOSIS — G4733 Obstructive sleep apnea (adult) (pediatric): Secondary | ICD-10-CM | POA: Diagnosis not present

## 2016-08-23 DIAGNOSIS — Z9989 Dependence on other enabling machines and devices: Secondary | ICD-10-CM

## 2016-08-23 NOTE — Progress Notes (Signed)
Current Outpatient Prescriptions on File Prior to Visit  Medication Sig  . albuterol (PROAIR HFA) 108 (90 Base) MCG/ACT inhaler Inhale 1-2 puffs into the lungs every 6 (six) hours as needed for wheezing or shortness of breath.  . AMBULATORY NON FORMULARY MEDICATION CPAP machine nightly  . Desoximetasone 0.05 % GEL Apply 1 application topically as needed (for eczema). Apply once a day as needed  . ezetimibe-simvastatin (VYTORIN) 10-20 MG tablet Take 1 tablet by mouth at bedtime. Annual appt due in August must see MD for refills  . fluticasone furoate-vilanterol (BREO ELLIPTA) 200-25 MCG/INH AEPB Inhale 1 puff into the lungs daily.  Marland Kitchen levocetirizine (XYZAL) 5 MG tablet Take 5 mg by mouth daily as needed.   . mometasone (ELOCON) 0.1 % cream Apply 1 application topically daily.  . montelukast (SINGULAIR) 10 MG tablet Take 10 mg by mouth at bedtime.    . Multiple Vitamin (MULTIVITAMIN) tablet Take 1 tablet by mouth daily.   Current Facility-Administered Medications on File Prior to Visit  Medication  . 0.9 %  sodium chloride infusion     Chief Complaint  Patient presents with  . Follow-up    Pt is here for a follow-up of his CPAP. States that it is working well for him. States that he still has a few episodes but not as many since got new machine. DME: [facility]     Sleep tests PSG 08/26/02 >> AHI 59 Auto CPAP 07/24/16 to 08/22/16 >> used on 30 of 30 nights with average 7 hrs 23 min.  Average AHI 12.8 with median CPAP 6 and 95 th percentile CPAP 8 cm H2O  Past medical history Allergies, Asthma, Diverticulosis, HLD, Tremors, PNA  Past surgical history, Family history, Social history, Allergies all reviewed.  Vital Signs BP 128/80 (BP Location: Left Arm, Patient Position: Sitting, Cuff Size: Normal)   Pulse 94   SpO2 96%   History of Present Illness Kenneth Sellers is a 61 y.o. male with obstructive sleep apnea.  I last saw him in 2012.  He was seen more recently by Dr. Corrie Dandy and set up for  new CPAP machine.  He is sleeping better since he got new machine.  His old machine broke and no longer worked.  He does notices some nights in which his sleep apnea numbers is on the high side, but most nights it's around 10 or less.  He uses nasal pillows.  He gets claustrophobic from larger sized masks.   Physical Exam  General - pleasant Eyes - pupils reactive ENT - no sinus tenderness, no oral exudate, no LAN, MP 4, slight overbite Cardiac - regular, no murmur Chest - no wheeze, rales Abd - soft, non tender Ext - no edema Skin - no rashes Neuro - normal strength Psych - normal mood   Assessment/Plan  Obstructive sleep apnea. - he is compliant with CPAP and reports benefit - continue auto CPAP - will arrange for chin strap to see if this helps with intermittent mask leak - discuss other options for treating sleep apnea >> recommended continuing CPAP for now as best option given severity of his sleep apnea  Allergic rhinitis. - discussed how this can contribute to severity of sleep apnea if uncontrolled, but that it would be unlikely that his allergies as sole culprit of his sleep apnea   Patient Instructions  Will arrange for Chin Strap for CPAP mask  Follow up in 1 year  Time spent 26 minutes  Chesley Mires, MD [place]  Pulmonary/Critical Care/Sleep Pager:  [phone] 08/23/2016, 11:13 AM

## 2016-08-23 NOTE — Patient Instructions (Signed)
Will arrange for Chin Strap for CPAP mask  Follow up in 1 year

## 2016-08-26 MED FILL — LEVOCETIRIZINE 5 MG TABLET: 5 | 30 days supply | Qty: 30 | Fill #6

## 2016-08-30 DIAGNOSIS — G4733 Obstructive sleep apnea (adult) (pediatric): Secondary | ICD-10-CM | POA: Diagnosis not present

## 2016-09-02 ENCOUNTER — Telehealth: Payer: Self-pay | Admitting: Internal Medicine

## 2016-09-02 ENCOUNTER — Other Ambulatory Visit: Payer: Self-pay | Admitting: Internal Medicine

## 2016-09-02 MED ORDER — EZETIMIBE-SIMVASTATIN 10-20 MG PO TABS: 1.0000 | ORAL_TABLET | Freq: Every day | ORAL | 0 refills | Status: DC

## 2016-09-02 MED FILL — EZETIMIBE-SIMVASTATIN 10-20: 10-20 | 30 days supply | Qty: 30 | Fill #0

## 2016-09-02 NOTE — Telephone Encounter (Signed)
Per office policy sent 30 day script only to local pharmacy...Kenneth Sellers

## 2016-09-02 NOTE — Telephone Encounter (Signed)
Patient has scheduled next CPE for August.  Please send vytorin in to [facility] long outpatient.

## 2016-09-14 MED FILL — MONTELUKAST SOD 10 MG TAB: 10 | 30 days supply | Qty: 30 | Fill #1

## 2016-09-15 MED FILL — LEVOCETIRIZINE 5 MG TABLET: 5 | 30 days supply | Qty: 30 | Fill #0

## 2016-09-27 ENCOUNTER — Other Ambulatory Visit (INDEPENDENT_AMBULATORY_CARE_PROVIDER_SITE_OTHER): Payer: BC Managed Care – PPO

## 2016-09-27 ENCOUNTER — Encounter: Payer: Self-pay | Admitting: Internal Medicine

## 2016-09-27 ENCOUNTER — Ambulatory Visit (INDEPENDENT_AMBULATORY_CARE_PROVIDER_SITE_OTHER): Payer: BC Managed Care – PPO | Admitting: Internal Medicine

## 2016-09-27 VITALS — BP 146/84 | HR 74 | Temp 98.3°F | Resp 16 | Ht 70.0 in | Wt 201.0 lb

## 2016-09-27 DIAGNOSIS — I10 Essential (primary) hypertension: Secondary | ICD-10-CM

## 2016-09-27 DIAGNOSIS — Z0000 Encounter for general adult medical examination without abnormal findings: Secondary | ICD-10-CM | POA: Diagnosis not present

## 2016-09-27 DIAGNOSIS — R0609 Other forms of dyspnea: Secondary | ICD-10-CM

## 2016-09-27 DIAGNOSIS — E785 Hyperlipidemia, unspecified: Secondary | ICD-10-CM

## 2016-09-27 LAB — COMPREHENSIVE METABOLIC PANEL
ALK PHOS: 57 U/L (ref 39–117)
ALT: 29 U/L (ref 0–53)
AST: 26 U/L (ref 0–37)
Albumin: 4.5 g/dL (ref 3.5–5.2)
BILIRUBIN TOTAL: 0.9 mg/dL (ref 0.2–1.2)
BUN: 12 mg/dL (ref 6–23)
CO2: 28 mEq/L (ref 19–32)
CREATININE: 0.91 mg/dL (ref 0.40–1.50)
Calcium: 9.7 mg/dL (ref 8.4–10.5)
Chloride: 102 mEq/L (ref 96–112)
GFR: 89.44 mL/min (ref >60.00)
GLUCOSE: 105 mg/dL — AB (ref 70–99)
Potassium: 4.1 mEq/L (ref 3.5–5.1)
Sodium: 140 mEq/L (ref 135–145)
TOTAL PROTEIN: 7.4 g/dL (ref 6.0–8.3)

## 2016-09-27 LAB — LIPID PANEL
Cholesterol: 176 mg/dL (ref 0–200)
HDL: 54.5 mg/dL (ref >39.00)
LDL Cholesterol: 101 mg/dL — ABNORMAL HIGH (ref 0–99)
NONHDL: 121.15
Total CHOL/HDL Ratio: 3
Triglycerides: 102 mg/dL (ref 0.0–149.0)
VLDL: 20.4 mg/dL (ref 0.0–40.0)

## 2016-09-27 LAB — CBC WITH DIFFERENTIAL/PLATELET
BASOS ABS: 0.1 10*3/uL (ref 0.0–0.1)
Basophils Relative: 1.3 % (ref 0.0–3.0)
EOS ABS: 0.1 10*3/uL (ref 0.0–0.7)
Eosinophils Relative: 2.7 % (ref 0.0–5.0)
HCT: 51.1 % (ref 39.0–52.0)
Hemoglobin: 17.1 g/dL — ABNORMAL HIGH (ref 13.0–17.0)
LYMPHS ABS: 1.2 10*3/uL (ref 0.7–4.0)
Lymphocytes Relative: 21.7 % (ref 12.0–46.0)
MCHC: 33.4 g/dL (ref 30.0–36.0)
MCV: 92.6 fl (ref 78.0–100.0)
MONO ABS: 0.8 10*3/uL (ref 0.1–1.0)
MONOS PCT: 13.8 % — AB (ref 3.0–12.0)
NEUTROS PCT: 60.5 % (ref 43.0–77.0)
Neutro Abs: 3.3 10*3/uL (ref 1.4–7.7)
Platelets: 187 10*3/uL (ref 150.0–400.0)
RBC: 5.52 Mil/uL (ref 4.22–5.81)
RDW: 13.1 % (ref 11.5–15.5)
WBC: 5.4 10*3/uL (ref 4.0–10.5)

## 2016-09-27 LAB — PSA: PSA: 0.27 ng/mL (ref 0.10–4.00)

## 2016-09-27 MED ORDER — EZETIMIBE-SIMVASTATIN 10-20 MG PO TABS: 1.0000 | ORAL_TABLET | Freq: Every day | ORAL | 1 refills | Status: DC

## 2016-09-27 NOTE — Patient Instructions (Signed)

## 2016-09-27 NOTE — Progress Notes (Signed)
Subjective:  Patient ID: Kenneth Sellers, male    DOB: [birth year]  Age: 61 y.o. MRN: [id number]  CC: Annual Exam   HPI HAIDYN CHADDERDON presents for a CPX. He complains of chronic dyspnea on exertion climbing stairs for 15 years. He tells me his father had a fatal MI at the age of 71. He's had a few episodes of fatigue but he denies chest pain, diaphoresis, dizziness, lightheadedness, decreased endurance, or palpitations.  Outpatient Medications Prior to Visit  Medication Sig Dispense Refill  . albuterol (PROAIR HFA) 108 (90 Base) MCG/ACT inhaler Inhale 1-2 puffs into the lungs every 6 (six) hours as needed for wheezing or shortness of breath. 1 Inhaler 5  . AMBULATORY NON FORMULARY MEDICATION CPAP machine nightly    . Desoximetasone 0.05 % GEL Apply 1 application topically as needed (for eczema). Apply once a day as needed    . fluticasone furoate-vilanterol (BREO ELLIPTA) 200-25 MCG/INH AEPB Inhale 1 puff into the lungs daily.    Marland Kitchen levocetirizine (XYZAL) 5 MG tablet Take 5 mg by mouth daily as needed.     . mometasone (ELOCON) 0.1 % cream Apply 1 application topically daily.    . montelukast (SINGULAIR) 10 MG tablet Take 10 mg by mouth at bedtime.      . Multiple Vitamin (MULTIVITAMIN) tablet Take 1 tablet by mouth daily.    Marland Kitchen ezetimibe-simvastatin (VYTORIN) 10-20 MG tablet Take 1 tablet by mouth at bedtime. Must keep 09/27/16 appt for future refills 30 tablet 0  . 0.9 %  sodium chloride infusion      No facility-administered medications prior to visit.     ROS Review of Systems  Constitutional: Positive for fatigue. Negative for activity change, appetite change, diaphoresis and unexpected weight change.  HENT: Negative.  Negative for trouble swallowing.   Eyes: Negative.   Respiratory: Positive for shortness of breath. Negative for apnea, cough, chest tightness, wheezing and stridor.   Cardiovascular: Negative.  Negative for chest pain, palpitations and leg swelling.  Gastrointestinal:  Negative for abdominal pain, constipation, diarrhea, nausea and vomiting.  Endocrine: Negative.   Genitourinary: Negative.  Negative for difficulty urinating, dysuria, frequency, hematuria, scrotal swelling, testicular pain and urgency.  Musculoskeletal: Negative.  Negative for back pain and myalgias.  Skin: Negative.   Allergic/Immunologic: Negative.   Neurological: Negative.  Negative for dizziness, weakness and headaches.  Hematological: Negative for adenopathy. Does not bruise/bleed easily.  Psychiatric/Behavioral: Negative.     Objective:  BP (!) 146/84 (BP Location: Left Arm, Patient Position: Sitting, Cuff Size: Normal)   Pulse 74   Temp 98.3 F (36.8 C) (Oral)   Resp 16   Ht 5\' 10"  (1.778 m)   Wt 201 lb (91.2 kg)   SpO2 100%   BMI 28.84 kg/m   BP Readings from Last 3 Encounters:  09/27/16 (!) 146/84  08/23/16 128/80  05/27/16 (!) 150/100    Wt Readings from Last 3 Encounters:  09/27/16 201 lb (91.2 kg)  05/27/16 201 lb 9.6 oz (91.4 kg)  11/12/15 200 lb (90.7 kg)    Physical Exam  Constitutional: He is oriented to person, place, and time. No distress.  HENT:  Mouth/Throat: Oropharynx is clear and moist. No oropharyngeal exudate.  Eyes: Conjunctivae are normal. Right eye exhibits no discharge. Left eye exhibits no discharge. No scleral icterus.  Neck: Normal range of motion. Neck supple. No JVD present. No thyromegaly present.  Cardiovascular: Normal rate, regular rhythm and intact distal pulses.  Exam reveals no gallop  and no friction rub.   No murmur heard. EKG-  Sinus  Rhythm  WITHIN NORMAL LIMITS   Pulmonary/Chest: Effort normal and breath sounds normal. No respiratory distress. He has no wheezes. He has no rales. He exhibits no tenderness.  Abdominal: Soft. Bowel sounds are normal. He exhibits no distension and no mass. There is no tenderness. There is no rebound and no guarding. Hernia confirmed negative in the right inguinal area and confirmed negative in  the left inguinal area.  Genitourinary: Rectum normal, testes normal and penis normal. Rectal exam shows no external hemorrhoid, no internal hemorrhoid, no fissure, no mass, no tenderness and guaiac negative stool. Prostate is not enlarged and not tender. Right testis shows no mass, no swelling and no tenderness. Right testis is descended. Left testis shows no mass, no swelling and no tenderness. Left testis is descended. Uncircumcised. No phimosis, paraphimosis, hypospadias, penile erythema or penile tenderness. No discharge found.  Musculoskeletal: Normal range of motion. He exhibits no edema, tenderness or deformity.  Lymphadenopathy:    He has no cervical adenopathy.       Right: No inguinal adenopathy present.       Left: No inguinal adenopathy present.  Neurological: He is alert and oriented to person, place, and time.  Skin: Skin is warm and dry. No rash noted. He is not diaphoretic. No erythema. No pallor.  Psychiatric: He has a normal mood and affect. His behavior is normal. Judgment and thought content normal.  Vitals reviewed.   Lab Results  Component Value Date   WBC 5.4 09/27/2016   HGB 17.1 (H) 09/27/2016   HCT 51.1 09/27/2016   PLT 187.0 09/27/2016   GLUCOSE 105 (H) 09/27/2016   CHOL 176 09/27/2016   TRIG 102.0 09/27/2016   HDL 54.50 09/27/2016   LDLCALC 101 (H) 09/27/2016   ALT 29 09/27/2016   AST 26 09/27/2016   NA 140 09/27/2016   K 4.1 09/27/2016   CL 102 09/27/2016   CREATININE 0.91 09/27/2016   BUN 12 09/27/2016   CO2 28 09/27/2016   TSH 3.12 09/27/2016   PSA 0.27 09/27/2016   HGBA1C 5.8 09/18/2014    Ct Abdomen Pelvis W Contrast  Result Date: 10/28/2015 CLINICAL DATA:  Progressive right-sided abdominal pain EXAM: CT ABDOMEN AND PELVIS WITH CONTRAST TECHNIQUE: Multidetector CT imaging of the abdomen and pelvis was performed using the standard protocol following bolus administration of intravenous contrast. Oral contrast was also administered. CONTRAST:   114mL ISOVUE-300 IOPAMIDOL (ISOVUE-300) INJECTION 61% COMPARISON:  None. FINDINGS: Lower chest: Lung bases are clear. Hepatobiliary: No focal liver lesions are appreciable. Gallbladder wall is not appreciably thickened. There is no biliary duct dilatation. Pancreas: No pancreatic mass or inflammatory focus. Spleen: No splenic lesions are evident. Adrenals/Urinary Tract: Adrenals appear normal bilaterally. There are small cysts in left kidney. There is a somewhat larger cyst arising from the medial upper pole of the left kidney measuring 2.0 x 1.7 cm. There is no hydronephrosis on either side. There is no renal or ureteral calculus on either side. Urinary bladder is midline with wall thickness within normal limits. Stomach/Bowel: Patient has had a previous Nissen fundoplication with surgical clips in the upper abdomen near the stomach. There is no wall thickening or mesenteric thickening in this area. No abnormal fluid collections. There are scattered sigmoid diverticula without diverticulitis. Rectum is borderline distended with stool without wall thickening in this area. There is no bowel wall or mesenteric thickening. No bowel obstruction. No free air or portal venous  air. Vascular/Lymphatic: There are scattered foci of calcification in the abdominal aorta and proximal common iliac arteries. There is no abdominal aortic aneurysm. Major mesenteric vessels appear patent. There is no adenopathy in the abdomen or pelvis. Reproductive: There are a few tiny prostatic calculi. Prostate and seminal vesicles appear normal in size and contour. There is no pelvic mass or pelvic fluid collection. Other: Appendix appears normal. There is a small ventral hernia containing only fat. There is no ascites or abscess in the abdomen or pelvis. Musculoskeletal: There is slight anterior wedging of the L2 vertebral body. There is a prominent Schmorl's node along the superior aspect of T12. There is degenerative change in the lumbar  spine. There are no blastic or lytic bone lesions. There is no intramuscular or abdominal wall lesion. IMPRESSION: Previous surgery in the upper abdomen hiatal hernia repair/ Nissen fundoplication. No complicating features evident. There are sigmoid diverticula without diverticulitis. No bowel obstruction or appreciable bowel wall thickening. No abscess. Appendix appears normal. No renal or ureteral calculi. No hydronephrosis. Aortic atherosclerosis.  No abdominal aortic aneurysm evident. Age uncertain mild anterior wedging of the L2 vertebral body. Multiple foci of degenerative type change in the lower thoracic and lumbar regions. Electronically Signed   By: Lowella Grip III M.D.   On: 10/28/2015 08:59    Assessment & Plan:   Yamato was seen today for annual exam.  Diagnoses and all orders for this visit:  DOE (dyspnea on exertion)- His symptoms are stable and his EKG is normal. Since he has a significant family history of CAD I have asked him to undergo an ETT to see if the DOE is a sign of ischemia. -     EKG 12-Lead -     CBC with Differential/Platelet; Future  Essential hypertension- his blood pressure is well-controlled. Labs are negative for any secondary causes or end organ damage. -     Comprehensive metabolic panel; Future -     CBC with Differential/Platelet; Future -     Thyroid Panel With TSH; Future  Hyperlipidemia with target LDL less than 130- he has achieved his LDL goal and is doing well on the statin. -     Thyroid Panel With TSH; Future  Routine general medical examination at a health care facility- exam completed, labs reviewed, vaccines reviewed, screening for colon cancer is up-to-date, patient education material was given. -     Lipid panel; Future -     PSA; Future   I am having Mr. Waites maintain his levocetirizine, montelukast, multivitamin, AMBULATORY NON FORMULARY MEDICATION, Desoximetasone, albuterol, mometasone, fluticasone furoate-vilanterol, and  ezetimibe-simvastatin. We will stop administering sodium chloride.  Meds ordered this encounter  Medications  . ezetimibe-simvastatin (VYTORIN) 10-20 MG tablet    Sig: Take 1 tablet by mouth at bedtime. Must keep 09/27/16 appt for future refills    Dispense:  90 tablet    Refill:  1     Follow-up: Return in about 6 months (around 03/30/2017).  Scarlette Calico, MD

## 2016-09-28 ENCOUNTER — Encounter: Payer: Self-pay | Admitting: Internal Medicine

## 2016-09-28 LAB — THYROID PANEL WITH TSH
FREE THYROXINE INDEX: 2.2 (ref 1.4–3.8)
T3 UPTAKE: 28 % (ref 22–35)
T4 TOTAL: 7.7 ug/dL (ref 4.9–10.5)
TSH: 3.12 mIU/L (ref 0.40–4.50)

## 2016-09-28 MED FILL — BREO ELLIPTA 200-25 MCG INH: 200-25 | 30 days supply | Qty: 60 | Fill #0

## 2016-09-29 MED FILL — EZETIMIBE-SIMVASTATIN 10-20: 10-20 | 90 days supply | Qty: 90 | Fill #0

## 2016-09-30 ENCOUNTER — Ambulatory Visit (HOSPITAL_COMMUNITY)
Admission: RE | Admit: 2016-09-30 | Discharge: 2016-09-30 | Disposition: A | Discharge status: Home / Self Care | Payer: BC Managed Care – PPO | Source: Ambulatory Visit | Attending: Cardiology | Admitting: Cardiology

## 2016-09-30 DIAGNOSIS — R0609 Other forms of dyspnea: Secondary | ICD-10-CM | POA: Diagnosis not present

## 2016-09-30 LAB — EXERCISE TOLERANCE TEST
CSEPED: 11 min
CSEPHR: 92 %
CSEPPHR: 146 {beats}/min
Estimated workload: 13.4 METS
Exercise duration (sec): 3 s
MPHR: 158 {beats}/min
RPE: 17
Rest HR: 100 {beats}/min

## 2016-10-01 ENCOUNTER — Encounter: Payer: Self-pay | Admitting: Internal Medicine

## 2016-10-18 MED FILL — MONTELUKAST SOD 10 MG TAB: 10 | 30 days supply | Qty: 30 | Fill #2

## 2016-10-18 MED FILL — MOMETASONE FUROATE 0.1% CRM: 0.1 | 15 days supply | Qty: 45 | Fill #2

## 2016-10-28 MED FILL — LEVOCETIRIZINE 5 MG TABLET: 5 | 30 days supply | Qty: 30 | Fill #1

## 2016-11-04 MED FILL — BREO ELLIPTA 200-25 MCG INH: 200-25 | 30 days supply | Qty: 60 | Fill #1

## 2016-11-16 MED FILL — MONTELUKAST SOD 10 MG TAB: 10 | 30 days supply | Qty: 30 | Fill #0

## 2016-11-23 MED FILL — LEVOCETIRIZINE 5 MG TABLET: 5 | 30 days supply | Qty: 30 | Fill #2

## 2016-11-27 ENCOUNTER — Ambulatory Visit (INDEPENDENT_AMBULATORY_CARE_PROVIDER_SITE_OTHER): Payer: BC Managed Care – PPO

## 2016-11-27 DIAGNOSIS — Z23 Encounter for immunization: Secondary | ICD-10-CM | POA: Diagnosis not present

## 2016-12-16 ENCOUNTER — Other Ambulatory Visit: Payer: Self-pay | Admitting: Internal Medicine

## 2016-12-16 DIAGNOSIS — J209 Acute bronchitis, unspecified: Secondary | ICD-10-CM

## 2016-12-16 MED FILL — MONTELUKAST SOD 10 MG TAB: 10 | 30 days supply | Qty: 30 | Fill #1

## 2016-12-16 MED FILL — BREO ELLIPTA 200-25 MCG INH: 200-25 | 30 days supply | Qty: 60 | Fill #2

## 2016-12-17 MED FILL — VENTOLIN HFA 90 MCG INHALER: 108 (90 BAS | 30 days supply | Qty: 18 | Fill #0

## 2016-12-20 DIAGNOSIS — G4733 Obstructive sleep apnea (adult) (pediatric): Secondary | ICD-10-CM | POA: Diagnosis not present

## 2016-12-27 MED FILL — LEVOCETIRIZINE 5 MG TABLET: 5 | 30 days supply | Qty: 30 | Fill #0

## 2017-01-03 MED FILL — EZETIMIBE-SIMVASTATIN 10-20: 10-20 | 90 days supply | Qty: 90 | Fill #1

## 2017-01-18 MED FILL — MONTELUKAST SOD 10 MG TAB: 10 | 30 days supply | Qty: 30 | Fill #2

## 2017-01-21 DIAGNOSIS — J3089 Other allergic rhinitis: Secondary | ICD-10-CM | POA: Diagnosis not present

## 2017-01-21 DIAGNOSIS — L309 Dermatitis, unspecified: Secondary | ICD-10-CM | POA: Diagnosis not present

## 2017-01-21 DIAGNOSIS — J4541 Moderate persistent asthma with (acute) exacerbation: Secondary | ICD-10-CM | POA: Diagnosis not present

## 2017-01-21 DIAGNOSIS — J301 Allergic rhinitis due to pollen: Secondary | ICD-10-CM | POA: Diagnosis not present

## 2017-01-21 MED FILL — BREO ELLIPTA 200-25 MCG INH: 200-25 | 42 days supply | Qty: 60 | Fill #0

## 2017-01-21 MED FILL — LEVOCETIRIZINE 5 MG TABLET: 5 | 90 days supply | Qty: 90 | Fill #0

## 2017-01-21 MED FILL — FLUTICASONE PROP 50 MCG SPR: 50 | 30 days supply | Qty: 16 | Fill #0

## 2017-02-14 MED FILL — MONTELUKAST SOD 10 MG TAB: 10 | 90 days supply | Qty: 90 | Fill #0

## 2017-02-24 MED FILL — BREO ELLIPTA 200-25 MCG INH: 200-25 | 30 days supply | Qty: 60 | Fill #1

## 2017-03-22 DIAGNOSIS — H5202 Hypermetropia, left eye: Secondary | ICD-10-CM | POA: Diagnosis not present

## 2017-03-22 DIAGNOSIS — H5211 Myopia, right eye: Secondary | ICD-10-CM | POA: Diagnosis not present

## 2017-03-30 ENCOUNTER — Ambulatory Visit (INDEPENDENT_AMBULATORY_CARE_PROVIDER_SITE_OTHER): Payer: BC Managed Care – PPO | Admitting: Internal Medicine

## 2017-03-30 ENCOUNTER — Encounter: Payer: Self-pay | Admitting: Internal Medicine

## 2017-03-30 VITALS — BP 160/96 | HR 86 | Temp 98.0°F | Resp 16 | Ht 70.0 in | Wt 199.0 lb

## 2017-03-30 DIAGNOSIS — R519 Headache, unspecified: Secondary | ICD-10-CM | POA: Insufficient documentation

## 2017-03-30 DIAGNOSIS — H538 Other visual disturbances: Secondary | ICD-10-CM | POA: Insufficient documentation

## 2017-03-30 DIAGNOSIS — I10 Essential (primary) hypertension: Secondary | ICD-10-CM

## 2017-03-30 DIAGNOSIS — E785 Hyperlipidemia, unspecified: Secondary | ICD-10-CM | POA: Diagnosis not present

## 2017-03-30 DIAGNOSIS — R51 Headache: Secondary | ICD-10-CM | POA: Diagnosis not present

## 2017-03-30 MED ORDER — AZILSARTAN-CHLORTHALIDONE 40-12.5 MG PO TABS: 1.0000 | ORAL_TABLET | Freq: Every day | ORAL | 1 refills | Status: DC

## 2017-03-30 MED ORDER — EZETIMIBE-SIMVASTATIN 10-20 MG PO TABS: 1.0000 | ORAL_TABLET | Freq: Every day | ORAL | 1 refills | Status: DC

## 2017-03-30 MED FILL — EZETIMIBE-SIMVASTATIN 10-20: 10-20 | 90 days supply | Qty: 90 | Fill #0

## 2017-03-30 MED FILL — BREO ELLIPTA 200-25 MCG INH: 200-25 | 30 days supply | Qty: 60 | Fill #2

## 2017-03-30 NOTE — Progress Notes (Signed)
Subjective:  Patient ID: Kenneth Sellers, male    DOB: [birth year]  Age: 62 y.o. MRN: [id number]  CC: Hypertension and Headache   HPI Kenneth Sellers presents for concerns about elevated blood pressure and dull headache.  He has had the headache for about 2 or 3 weeks. He describes it as a dull ache on the right upper side of his head and in his posterior head as well.  He also complains that his blood pressure has been poorly controlled and he has had a few episodes of blurred vision.  He saw his eye doctor a week ago and was told that his eye exam was normal.  Outpatient Medications Prior to Visit  Medication Sig Dispense Refill  . AMBULATORY NON FORMULARY MEDICATION CPAP machine nightly    . Desoximetasone 0.05 % GEL Apply 1 application topically as needed (for eczema). Apply once a day as needed    . fluticasone (FLONASE) 50 MCG/ACT nasal spray   3  . fluticasone furoate-vilanterol (BREO ELLIPTA) 200-25 MCG/INH AEPB Inhale 1 puff into the lungs daily.    Marland Kitchen levocetirizine (XYZAL) 5 MG tablet Take 5 mg by mouth daily as needed.     . mometasone (ELOCON) 0.1 % cream Apply 1 application topically daily.    . montelukast (SINGULAIR) 10 MG tablet Take 10 mg by mouth at bedtime.      . Multiple Vitamin (MULTIVITAMIN) tablet Take 1 tablet by mouth daily.    . VENTOLIN HFA 108 (90 Base) MCG/ACT inhaler INHALE 1 TO 2 PUFFS INTO THE LUNGS EVERY 6 HOURS AS NEEDED FOR WHEEZING OR SHORTNESS OF BREATH 18 g 5  . ezetimibe-simvastatin (VYTORIN) 10-20 MG tablet Take 1 tablet by mouth at bedtime. Must keep 09/27/16 appt for future refills 90 tablet 1   No facility-administered medications prior to visit.     ROS Review of Systems  Constitutional: Negative for appetite change, chills, fatigue and fever.  HENT: Negative.  Negative for sinus pressure and trouble swallowing.   Eyes: Positive for visual disturbance. Negative for photophobia, pain and redness.  Respiratory: Negative.  Negative for cough, chest  tightness, shortness of breath and wheezing.   Cardiovascular: Negative for chest pain, palpitations and leg swelling.  Gastrointestinal: Negative for abdominal pain, constipation, diarrhea, nausea and vomiting.  Endocrine: Negative.   Genitourinary: Negative for difficulty urinating.  Musculoskeletal: Negative.  Negative for neck pain.  Skin: Negative.   Allergic/Immunologic: Negative.   Neurological: Positive for headaches. Negative for dizziness, tremors, seizures, syncope, facial asymmetry, speech difficulty, weakness, light-headedness and numbness.  Hematological: Negative for adenopathy. Does not bruise/bleed easily.  Psychiatric/Behavioral: Negative.  Negative for decreased concentration, dysphoric mood and suicidal ideas. The patient is not nervous/anxious.     Objective:  BP (!) 160/96 (BP Location: Left Arm, Patient Position: Sitting, Cuff Size: Normal)   Pulse 86   Temp 98 F (36.7 C) (Oral)   Resp 16   Ht 5\' 10"  (1.778 m)   Wt 199 lb (90.3 kg)   SpO2 99%   BMI 28.55 kg/m   BP Readings from Last 3 Encounters:  03/30/17 (!) 160/96  09/27/16 (!) 146/84  08/23/16 128/80    Wt Readings from Last 3 Encounters:  03/30/17 199 lb (90.3 kg)  09/27/16 201 lb (91.2 kg)  05/27/16 201 lb 9.6 oz (91.4 kg)    Physical Exam  Constitutional: No distress.  HENT:  Mouth/Throat: Oropharynx is clear and moist. No oropharyngeal exudate.  Eyes: Conjunctivae and EOM are normal.  Pupils are equal, round, and reactive to light. Right eye exhibits no discharge. Left eye exhibits no discharge. No scleral icterus.  Neck: Normal range of motion. Neck supple. No JVD present. No thyromegaly present.  Cardiovascular: Normal rate, regular rhythm and normal heart sounds. Exam reveals no gallop.  No murmur heard. Pulmonary/Chest: Effort normal and breath sounds normal. No respiratory distress. He has no wheezes. He has no rales.  Abdominal: Soft. Bowel sounds are normal. He exhibits no distension  and no mass. There is no tenderness. There is no guarding.  Musculoskeletal: Normal range of motion. He exhibits no edema, tenderness or deformity.  Lymphadenopathy:    He has no cervical adenopathy.  Neurological: He is alert. He has normal strength. He displays no atrophy, no tremor and normal reflexes. No cranial nerve deficit or sensory deficit. He exhibits normal muscle tone. He displays a negative Romberg sign. He displays no seizure activity. Coordination and gait normal. He displays no Babinski's sign on the right side. He displays no Babinski's sign on the left side.  Reflex Scores:      Tricep reflexes are 0 on the right side and 0 on the left side.      Bicep reflexes are 0 on the right side and 0 on the left side.      Brachioradialis reflexes are 0 on the right side and 0 on the left side.      Patellar reflexes are 2+ on the right side and 2+ on the left side.      Achilles reflexes are 1+ on the right side and 1+ on the left side. Skin: Skin is warm and dry. No rash noted. He is not diaphoretic. No erythema. No pallor.  Vitals reviewed.   Lab Results  Component Value Date   WBC 5.4 09/27/2016   HGB 17.1 (H) 09/27/2016   HCT 51.1 09/27/2016   PLT 187.0 09/27/2016   GLUCOSE 105 (H) 09/27/2016   CHOL 176 09/27/2016   TRIG 102.0 09/27/2016   HDL 54.50 09/27/2016   LDLCALC 101 (H) 09/27/2016   ALT 29 09/27/2016   AST 26 09/27/2016   NA 140 09/27/2016   K 4.1 09/27/2016   CL 102 09/27/2016   CREATININE 0.91 09/27/2016   BUN 12 09/27/2016   CO2 28 09/27/2016   TSH 3.12 09/27/2016   PSA 0.27 09/27/2016   HGBA1C 5.8 09/18/2014    No results found.  Assessment & Plan:   Irene was seen today for hypertension and headache.  Diagnoses and all orders for this visit:  Essential hypertension- His blood pressure is not well controlled and he is symptomatic.  Will start an ARB/thiazide diuretic combination. -     Discontinue: Azilsartan-Chlorthalidone (EDARBYCLOR) 40-12.5  MG TABS; Take 1 tablet by mouth daily. -     olmesartan-hydrochlorothiazide (BENICAR HCT) 40-12.5 MG tablet; Take 1 tablet by mouth daily.  Hyperlipidemia with target LDL less than 130-he is doing well on the statin. -     ezetimibe-simvastatin (VYTORIN) 10-20 MG tablet; Take 1 tablet by mouth at bedtime. Must keep 09/27/16 appt for future refills  Intractable episodic headache, unspecified headache type- He has new onset, atypical headache with blurred vision.  His neurologic exam is nonfocal.  I have ordered an MRI of his brain to see if there is been a bleed, tumor, mass, or aneurysm. -     [name] Contrast; Future  Blurred vision, bilateral- as above -     [name] Contrast; Future  I have discontinued Braelin L. Pickerel's Azilsartan-Chlorthalidone. I am also having him start on olmesartan-hydrochlorothiazide. Additionally, I am having him maintain his levocetirizine, montelukast, multivitamin, AMBULATORY NON FORMULARY MEDICATION, Desoximetasone, mometasone, fluticasone furoate-vilanterol, VENTOLIN HFA, fluticasone, and ezetimibe-simvastatin.  Meds ordered this encounter  Medications  . ezetimibe-simvastatin (VYTORIN) 10-20 MG tablet    Sig: Take 1 tablet by mouth at bedtime. Must keep 09/27/16 appt for future refills    Dispense:  90 tablet    Refill:  1  . DISCONTD: Azilsartan-Chlorthalidone (EDARBYCLOR) 40-12.5 MG TABS    Sig: Take 1 tablet by mouth daily.    Dispense:  30 tablet    Refill:  1  . olmesartan-hydrochlorothiazide (BENICAR HCT) 40-12.5 MG tablet    Sig: Take 1 tablet by mouth daily.    Dispense:  30 tablet    Refill:  1     Follow-up: Return in about 4 weeks (around 04/27/2017).  Scarlette Calico, MD

## 2017-03-30 NOTE — Patient Instructions (Signed)

## 2017-03-31 ENCOUNTER — Telehealth: Payer: Self-pay | Admitting: *Deleted

## 2017-03-31 ENCOUNTER — Ambulatory Visit
Admission: RE | Admit: 2017-03-31 | Discharge: 2017-03-31 | Disposition: A | Discharge status: Home / Self Care | Payer: BC Managed Care – PPO | Source: Ambulatory Visit | Attending: Internal Medicine | Admitting: Internal Medicine

## 2017-03-31 DIAGNOSIS — H538 Other visual disturbances: Secondary | ICD-10-CM

## 2017-03-31 DIAGNOSIS — R51 Headache: Principal | ICD-10-CM

## 2017-03-31 DIAGNOSIS — R519 Headache, unspecified: Secondary | ICD-10-CM

## 2017-03-31 NOTE — Telephone Encounter (Signed)
PA for Edarbyclor initiated and sent via CoverMyMeds.

## 2017-04-01 ENCOUNTER — Encounter: Payer: Self-pay | Admitting: Internal Medicine

## 2017-04-01 ENCOUNTER — Other Ambulatory Visit: Payer: Self-pay | Admitting: Internal Medicine

## 2017-04-01 DIAGNOSIS — I10 Essential (primary) hypertension: Secondary | ICD-10-CM

## 2017-04-01 MED ORDER — OLMESARTAN MEDOXOMIL-HCTZ 40-12.5 MG PO TABS: 1.0000 | ORAL_TABLET | Freq: Every day | ORAL | 1 refills | Status: DC

## 2017-04-01 NOTE — Telephone Encounter (Signed)
PA was denied. Please advise if there is an alternative pt should try.

## 2017-04-01 NOTE — Telephone Encounter (Signed)
Olmesartan is on Back Order

## 2017-04-03 ENCOUNTER — Other Ambulatory Visit: Payer: Self-pay | Admitting: Internal Medicine

## 2017-04-03 DIAGNOSIS — I10 Essential (primary) hypertension: Secondary | ICD-10-CM

## 2017-04-03 MED ORDER — LOSARTAN POTASSIUM-HCTZ 100-25 MG PO TABS: 1.0000 | ORAL_TABLET | Freq: Every day | ORAL | 0 refills | Status: DC

## 2017-04-04 MED FILL — LOSARTAN POTASSIUM-HCTZ 100: 100-25 | 90 days supply | Qty: 90 | Fill #0

## 2017-04-19 DIAGNOSIS — H2513 Age-related nuclear cataract, bilateral: Secondary | ICD-10-CM | POA: Diagnosis not present

## 2017-04-21 DIAGNOSIS — L309 Dermatitis, unspecified: Secondary | ICD-10-CM | POA: Diagnosis not present

## 2017-04-21 DIAGNOSIS — J069 Acute upper respiratory infection, unspecified: Secondary | ICD-10-CM | POA: Diagnosis not present

## 2017-04-21 DIAGNOSIS — J3089 Other allergic rhinitis: Secondary | ICD-10-CM | POA: Diagnosis not present

## 2017-04-21 DIAGNOSIS — J209 Acute bronchitis, unspecified: Secondary | ICD-10-CM | POA: Diagnosis not present

## 2017-04-21 DIAGNOSIS — J4541 Moderate persistent asthma with (acute) exacerbation: Secondary | ICD-10-CM | POA: Diagnosis not present

## 2017-04-21 DIAGNOSIS — J301 Allergic rhinitis due to pollen: Secondary | ICD-10-CM | POA: Diagnosis not present

## 2017-04-21 MED FILL — predniSONE 10 MG TABS: 10 | 4 days supply | Qty: 10 | Fill #0

## 2017-04-21 MED FILL — BENZONATATE 100 MG CAP: 100 | 7 days supply | Qty: 21 | Fill #0

## 2017-04-21 MED FILL — AZITHROMYCIN 250 MG TABS: 250 | 5 days supply | Qty: 6 | Fill #0

## 2017-04-27 ENCOUNTER — Ambulatory Visit (INDEPENDENT_AMBULATORY_CARE_PROVIDER_SITE_OTHER): Payer: BC Managed Care – PPO | Admitting: Internal Medicine

## 2017-04-27 ENCOUNTER — Encounter: Payer: Self-pay | Admitting: Internal Medicine

## 2017-04-27 VITALS — BP 124/72 | HR 90 | Temp 98.2°F | Resp 16 | Ht 70.0 in | Wt 201.8 lb

## 2017-04-27 DIAGNOSIS — I10 Essential (primary) hypertension: Secondary | ICD-10-CM

## 2017-04-27 DIAGNOSIS — Z23 Encounter for immunization: Secondary | ICD-10-CM | POA: Diagnosis not present

## 2017-04-27 NOTE — Patient Instructions (Addendum)
Hypertension Hypertension, commonly called high blood pressure, is when the force of blood pumping through the arteries is too strong. The arteries are the blood vessels that carry blood from the heart throughout the body. Hypertension forces the heart to work harder to pump blood and may cause arteries to become narrow or stiff. Having untreated or uncontrolled hypertension can cause heart attacks, strokes, kidney disease, and other problems. A blood pressure reading consists of a higher number over a lower number. Ideally, your blood pressure should be below 120/80. The first ("top") number is called the systolic pressure. It is a measure of the pressure in your arteries as your heart beats. The second ("bottom") number is called the diastolic pressure. It is a measure of the pressure in your arteries as the heart relaxes. What are the causes? The cause of this condition is not known. What increases the risk? Some risk factors for high blood pressure are under your control. Others are not. Factors you can change  Smoking.  Having type 2 diabetes mellitus, high cholesterol, or both.  Not getting enough exercise or physical activity.  Being overweight.  Having too much fat, sugar, calories, or salt (sodium) in your diet.  Drinking too much alcohol. Factors that are difficult or impossible to change  Having chronic kidney disease.  Having a family history of high blood pressure.  Age. Risk increases with age.  Race. You may be at higher risk if you are African-American.  Gender. Men are at higher risk than women before age 45. After age 65, women are at higher risk than men.  Having obstructive sleep apnea.  Stress. What are the signs or symptoms? Extremely high blood pressure (hypertensive crisis) may cause:  Headache.  Anxiety.  Shortness of breath.  Nosebleed.  Nausea and vomiting.  Severe chest pain.  Jerky movements you cannot control (seizures).  How is this  diagnosed? This condition is diagnosed by measuring your blood pressure while you are seated, with your arm resting on a surface. The cuff of the blood pressure monitor will be placed directly against the skin of your upper arm at the level of your heart. It should be measured at least twice using the same arm. Certain conditions can cause a difference in blood pressure between your right and left arms. Certain factors can cause blood pressure readings to be lower or higher than normal (elevated) for a short period of time:  When your blood pressure is higher when you are in a health care provider's office than when you are at home, this is called white coat hypertension. Most people with this condition do not need medicines.  When your blood pressure is higher at home than when you are in a health care provider's office, this is called masked hypertension. Most people with this condition may need medicines to control blood pressure.  If you have a high blood pressure reading during one visit or you have normal blood pressure with other risk factors:  You may be asked to return on a different day to have your blood pressure checked again.  You may be asked to monitor your blood pressure at home for 1 week or longer.  If you are diagnosed with hypertension, you may have other blood or imaging tests to help your health care provider understand your overall risk for other conditions. How is this treated? This condition is treated by making healthy lifestyle changes, such as eating healthy foods, exercising more, and reducing your alcohol intake. Your   health care provider may prescribe medicine if lifestyle changes are not enough to get your blood pressure under control, and if:  Your systolic blood pressure is above 130.  Your diastolic blood pressure is above 80.  Your personal target blood pressure may vary depending on your medical conditions, your age, and other factors. Follow these  instructions at home: Eating and drinking  Eat a diet that is high in fiber and potassium, and low in sodium, added sugar, and fat. An example eating plan is called the DASH (Dietary Approaches to Stop Hypertension) diet. To eat this way: ? Eat plenty of fresh fruits and vegetables. Try to fill half of your plate at each meal with fruits and vegetables. ? Eat whole grains, such as whole wheat pasta, brown rice, or whole grain bread. Fill about one quarter of your plate with whole grains. ? Eat or drink low-fat dairy products, such as skim milk or low-fat yogurt. ? Avoid fatty cuts of meat, processed or cured meats, and poultry with skin. Fill about one quarter of your plate with lean proteins, such as fish, chicken without skin, beans, eggs, and tofu. ? Avoid premade and processed foods. These tend to be higher in sodium, added sugar, and fat.  Reduce your daily sodium intake. Most people with hypertension should eat less than 1,500 mg of sodium a day.  Limit alcohol intake to no more than 1 drink a day for nonpregnant women and 2 drinks a day for men. One drink equals 12 oz of beer, 5 oz of wine, or 1 oz of hard liquor. Lifestyle  Work with your health care provider to maintain a healthy body weight or to lose weight. Ask what an ideal weight is for you.  Get at least 30 minutes of exercise that causes your heart to beat faster (aerobic exercise) most days of the week. Activities may include walking, swimming, or biking.  Include exercise to strengthen your muscles (resistance exercise), such as pilates or lifting weights, as part of your weekly exercise routine. Try to do these types of exercises for 30 minutes at least 3 days a week.  Do not use any products that contain nicotine or tobacco, such as cigarettes and e-cigarettes. If you need help quitting, ask your health care provider.  Monitor your blood pressure at home as told by your health care provider.  Keep all follow-up visits as  told by your health care provider. This is important. Medicines  Take over-the-counter and prescription medicines only as told by your health care provider. Follow directions carefully. Blood pressure medicines must be taken as prescribed.  Do not skip doses of blood pressure medicine. Doing this puts you at risk for problems and can make the medicine less effective.  Ask your health care provider about side effects or reactions to medicines that you should watch for. Contact a health care provider if:  You think you are having a reaction to a medicine you are taking.  You have headaches that keep coming back (recurring).  You feel dizzy.  You have swelling in your ankles.  You have trouble with your vision. Get help right away if:  You develop a severe headache or confusion.  You have unusual weakness or numbness.  You feel faint.  You have severe pain in your chest or abdomen.  You vomit repeatedly.  You have trouble breathing. Summary  Hypertension is when the force of blood pumping through your arteries is too strong. If this condition is not   controlled, it may put you at risk for serious complications.  Your personal target blood pressure may vary depending on your medical conditions, your age, and other factors. For most people, a normal blood pressure is less than 120/80.  Hypertension is treated with lifestyle changes, medicines, or a combination of both. Lifestyle changes include weight loss, eating a healthy, low-sodium diet, exercising more, and limiting alcohol. This information is not intended to replace advice given to you by your health care provider. Make sure you discuss any questions you have with your health care provider. Document Released: 01/25/2005 Document Revised: 12/24/2015 Document Reviewed: 12/24/2015 Elsevier Interactive Patient Education  2018 [place]. Pneumococcal Vaccine, Polyvalent solution for injection What is this  medicine? PNEUMOCOCCAL VACCINE, POLYVALENT (NEU mo KOK al vak SEEN, pol ee VEY luhnt) is a vaccine to prevent pneumococcus bacteria infection. These bacteria are a major cause of ear infections, Strep throat infections, and serious pneumonia, meningitis, or blood infections worldwide. These vaccines help the body to produce antibodies (protective substances) that help your body defend against these bacteria. This vaccine is recommended for people 56 years of age and older with health problems. It is also recommended for all adults over 74 years old. This vaccine will not treat an infection. This medicine may be used for other purposes; ask your health care provider or pharmacist if you have questions. COMMON BRAND NAME(S): Pneumovax 23 What should I tell my health care provider before I take this medicine? They need to know if you have any of these conditions: -bleeding problems -bone marrow or organ transplant -cancer, Hodgkin's disease -fever -infection -immune system problems -low platelet count in the blood -seizures -an unusual or allergic reaction to pneumococcal vaccine, diphtheria toxoid, other vaccines, latex, other medicines, foods, dyes, or preservatives -pregnant or trying to get pregnant -breast-feeding How should I use this medicine? This vaccine is for injection into a muscle or under the skin. It is given by a health care professional. A copy of Vaccine Information Statements will be given before each vaccination. Read this sheet carefully each time. The sheet may change frequently. Talk to your pediatrician regarding the use of this medicine in children. While this drug may be prescribed for children as young as 28 years of age for selected conditions, precautions do apply. Overdosage: If you think you have taken too much of this medicine contact a poison control center or emergency room at once. NOTE: This medicine is only for you. Do not share this medicine with others. What  if I miss a dose? It is important not to miss your dose. Call your doctor or health care professional if you are unable to keep an appointment. What may interact with this medicine? -medicines for cancer chemotherapy -medicines that suppress your immune function -medicines that treat or prevent blood clots like warfarin, enoxaparin, and dalteparin -steroid medicines like prednisone or cortisone This list may not describe all possible interactions. Give your health care provider a list of all the medicines, herbs, non-prescription drugs, or dietary supplements you use. Also tell them if you smoke, drink alcohol, or use illegal drugs. Some items may interact with your medicine. What should I watch for while using this medicine? Mild fever and pain should go away in 3 days or less. Report any unusual symptoms to your doctor or health care professional. What side effects may I notice from receiving this medicine? Side effects that you should report to your doctor or health care professional as soon as possible: -allergic  reactions like skin rash, itching or hives, swelling of the face, lips, or tongue -breathing problems -confused -fever over 102 degrees F -pain, tingling, numbness in the hands or feet -seizures -unusual bleeding or bruising -unusual muscle weakness Side effects that usually do not require medical attention (report to your doctor or health care professional if they continue or are bothersome): -aches and pains -diarrhea -fever of 102 degrees F or less -headache -irritable -loss of appetite -pain, tender at site where injected -trouble sleeping This list may not describe all possible side effects. Call your doctor for medical advice about side effects. You may report side effects to FDA at 1-800-FDA-1088. Where should I keep my medicine? This does not apply. This vaccine is given in a clinic, pharmacy, doctor's office, or other health care setting and will not be stored at  home. NOTE: This sheet is a summary. It may not cover all possible information. If you have questions about this medicine, talk to your doctor, pharmacist, or health care provider.  2018 Elsevier/Gold Standard (2007-09-01 14:32:37)

## 2017-04-27 NOTE — Progress Notes (Signed)
Subjective:  Patient ID: Kenneth Sellers, male    DOB: [birth year]  Age: 62 y.o. MRN: [id number]  CC: Follow-up; Hypertension; and Asthma   HPI [name] presents for f/up - He tells me that his blood pressure has been well controlled.  Since I last saw him he has had a flareup of asthma and he is taking a course of prednisone at the direction of his allergist.  He feels well today and offers no complaints.  Outpatient Medications Prior to Visit  Medication Sig Dispense Refill  . AMBULATORY NON FORMULARY MEDICATION CPAP machine nightly    . Desoximetasone 0.05 % GEL Apply 1 application topically as needed (for eczema). Apply once a day as needed    . ezetimibe-simvastatin (VYTORIN) 10-20 MG tablet Take 1 tablet by mouth at bedtime. Must keep 09/27/16 appt for future refills 90 tablet 1  . fluticasone (FLONASE) 50 MCG/ACT nasal spray   3  . fluticasone furoate-vilanterol (BREO ELLIPTA) 200-25 MCG/INH AEPB Inhale 1 puff into the lungs daily.    Marland Kitchen levocetirizine (XYZAL) 5 MG tablet Take 5 mg by mouth daily as needed.     Marland Kitchen losartan-hydrochlorothiazide (HYZAAR) 100-25 MG tablet Take 1 tablet by mouth daily. 90 tablet 0  . mometasone (ELOCON) 0.1 % cream Apply 1 application topically daily.    . montelukast (SINGULAIR) 10 MG tablet Take 10 mg by mouth at bedtime.      . Multiple Vitamin (MULTIVITAMIN) tablet Take 1 tablet by mouth daily.    . VENTOLIN HFA 108 (90 Base) MCG/ACT inhaler INHALE 1 TO 2 PUFFS INTO THE LUNGS EVERY 6 HOURS AS NEEDED FOR WHEEZING OR SHORTNESS OF BREATH 18 g 5  . olmesartan-hydrochlorothiazide (BENICAR HCT) 40-12.5 MG tablet Take 1 tablet by mouth daily. 30 tablet 1   No facility-administered medications prior to visit.     ROS Review of Systems  Constitutional: Negative for appetite change, chills, diaphoresis and fatigue.  HENT: Negative.   Eyes: Negative for visual disturbance.  Respiratory: Negative for cough, chest tightness, shortness of breath and wheezing.    Cardiovascular: Negative for chest pain, palpitations and leg swelling.  Gastrointestinal: Negative.  Negative for abdominal pain, constipation, diarrhea and vomiting.  Endocrine: Negative.   Genitourinary: Negative.  Negative for difficulty urinating.  Musculoskeletal: Negative.  Negative for arthralgias and myalgias.  Skin: Negative.  Negative for color change.  Allergic/Immunologic: Negative.   Neurological: Negative.  Negative for dizziness, weakness and light-headedness.  Hematological: Negative for adenopathy. Does not bruise/bleed easily.  Psychiatric/Behavioral: Negative.     Objective:  BP 124/72 (BP Location: Left Arm, Patient Position: Sitting, Cuff Size: Large)   Pulse 90   Temp 98.2 F (36.8 C) (Oral)   Resp 16   Ht 5\' 10"  (1.778 m)   Wt 201 lb 12.8 oz (91.5 kg)   SpO2 98%   BMI 28.96 kg/m   BP Readings from Last 3 Encounters:  04/27/17 124/72  03/30/17 (!) 160/96  09/27/16 (!) 146/84    Wt Readings from Last 3 Encounters:  04/27/17 201 lb 12.8 oz (91.5 kg)  03/30/17 199 lb (90.3 kg)  09/27/16 201 lb (91.2 kg)    Physical Exam  Constitutional: He is oriented to person, place, and time. No distress.  HENT:  Mouth/Throat: Oropharynx is clear and moist. No oropharyngeal exudate.  Eyes: Conjunctivae are normal. Left eye exhibits no discharge. No scleral icterus.  Neck: Normal range of motion. No JVD present. No thyromegaly present.  Cardiovascular: Normal rate,  regular rhythm and normal heart sounds. Exam reveals no gallop and no friction rub.  No murmur heard. Pulmonary/Chest: Effort normal and breath sounds normal. No respiratory distress. He has no wheezes. He has no rales.  Abdominal: Soft. Bowel sounds are normal. He exhibits no distension. There is no tenderness. There is no guarding.  Musculoskeletal: Normal range of motion. He exhibits no edema or tenderness.  Lymphadenopathy:    He has no cervical adenopathy.  Neurological: He is alert and oriented  to person, place, and time.  Skin: Skin is warm and dry. No rash noted. He is not diaphoretic. No erythema. No pallor.  Vitals reviewed.   Lab Results  Component Value Date   WBC 5.4 09/27/2016   HGB 17.1 (H) 09/27/2016   HCT 51.1 09/27/2016   PLT 187.0 09/27/2016   GLUCOSE 105 (H) 09/27/2016   CHOL 176 09/27/2016   TRIG 102.0 09/27/2016   HDL 54.50 09/27/2016   LDLCALC 101 (H) 09/27/2016   ALT 29 09/27/2016   AST 26 09/27/2016   NA 140 09/27/2016   K 4.1 09/27/2016   CL 102 09/27/2016   CREATININE 0.91 09/27/2016   BUN 12 09/27/2016   CO2 28 09/27/2016   TSH 3.12 09/27/2016   PSA 0.27 09/27/2016   HGBA1C 5.8 09/18/2014    [name] Contrast  Result Date: 03/31/2017 CLINICAL DATA:  62 y/o M; 3 weeks of severe headache with blurry vision in the right eye. EXAM: MRI HEAD WITHOUT CONTRAST TECHNIQUE: Multiplanar, multiecho pulse sequences of the brain and surrounding structures were obtained without intravenous contrast. COMPARISON:  None. FINDINGS: Brain: No acute infarction, hemorrhage, hydrocephalus, extra-axial collection or mass lesion. Vascular: Normal flow voids. Skull and upper cervical spine: Normal marrow signal. Sinuses/Orbits: Negative. Other: None. IMPRESSION: No acute intracranial abnormality. Unremarkable MRI of the brain for age. Electronically Signed   By: Kristine Garbe M.D.   On: 03/31/2017 20:38    Assessment & Plan:   [name] was seen today for follow-up, hypertension and asthma.  Diagnoses and all orders for this visit:  Need for prophylactic vaccination against Streptococcus pneumoniae (pneumococcus) -     Pneumococcal polysaccharide vaccine 23-valent greater than or equal to 2yo subcutaneous/IM  Essential hypertension-his blood pressure is well controlled on the current combination of an ARB and thiazide diuretic.  We will continue with the current dose.   I have discontinued Alando L. Tatum's olmesartan-hydrochlorothiazide. I am also having  him maintain his levocetirizine, montelukast, multivitamin, AMBULATORY NON FORMULARY MEDICATION, Desoximetasone, mometasone, fluticasone furoate-vilanterol, VENTOLIN HFA, fluticasone, ezetimibe-simvastatin, and losartan-hydrochlorothiazide.  No orders of the defined types were placed in this encounter.    Follow-up: Return in about 6 months (around 10/28/2017).  Scarlette Calico, MD

## 2017-05-05 MED FILL — BREO ELLIPTA 200-25 MCG INH: 200-25 | 30 days supply | Qty: 60 | Fill #3

## 2017-05-10 MED FILL — LEVOCETIRIZINE 5 MG TABLET: 5 | 90 days supply | Qty: 90 | Fill #1

## 2017-05-10 MED FILL — MONTELUKAST SOD 10 MG TAB: 10 | 90 days supply | Qty: 90 | Fill #1

## 2017-06-13 MED FILL — BREO ELLIPTA 200-25 MCG INH: 200-25 | 30 days supply | Qty: 60 | Fill #4

## 2017-06-28 ENCOUNTER — Other Ambulatory Visit: Payer: Self-pay | Admitting: Internal Medicine

## 2017-06-28 DIAGNOSIS — I10 Essential (primary) hypertension: Secondary | ICD-10-CM

## 2017-06-28 MED FILL — LOSARTAN-HCTZ 100-25 MG TAB: 100-25 | 90 days supply | Qty: 90 | Fill #0

## 2017-06-29 DIAGNOSIS — G4733 Obstructive sleep apnea (adult) (pediatric): Secondary | ICD-10-CM | POA: Diagnosis not present

## 2017-06-29 MED FILL — EZETIMIBE-SIMVASTATIN 10-20: 10-20 | 90 days supply | Qty: 90 | Fill #1

## 2017-07-19 MED FILL — BREO ELLIPTA 200-25 MCG INH: 200-25 | 30 days supply | Qty: 60 | Fill #5

## 2017-08-01 ENCOUNTER — Other Ambulatory Visit: Payer: Self-pay

## 2017-08-01 ENCOUNTER — Encounter: Payer: Self-pay | Admitting: Family Medicine

## 2017-08-01 ENCOUNTER — Ambulatory Visit (INDEPENDENT_AMBULATORY_CARE_PROVIDER_SITE_OTHER): Payer: BC Managed Care – PPO | Admitting: Family Medicine

## 2017-08-01 VITALS — BP 110/72 | HR 100 | Temp 97.8°F | Resp 16 | Ht 68.5 in | Wt 192.4 lb

## 2017-08-01 DIAGNOSIS — R55 Syncope and collapse: Secondary | ICD-10-CM | POA: Diagnosis not present

## 2017-08-01 DIAGNOSIS — I10 Essential (primary) hypertension: Secondary | ICD-10-CM

## 2017-08-01 DIAGNOSIS — I952 Hypotension due to drugs: Secondary | ICD-10-CM

## 2017-08-01 DIAGNOSIS — I9589 Other hypotension: Secondary | ICD-10-CM | POA: Diagnosis not present

## 2017-08-01 LAB — BASIC METABOLIC PANEL
BUN: 14 mg/dL (ref 6–23)
CO2: 29 meq/L (ref 19–32)
Calcium: 10 mg/dL (ref 8.4–10.5)
Chloride: 102 mEq/L (ref 96–112)
Creatinine, Ser: 1.14 mg/dL (ref 0.40–1.50)
GFR: 68.78 mL/min (ref >60.00)
GLUCOSE: 55 mg/dL — AB (ref 70–99)
POTASSIUM: 3.6 meq/L (ref 3.5–5.1)
SODIUM: 142 meq/L (ref 135–145)

## 2017-08-01 LAB — CBC WITH DIFFERENTIAL/PLATELET
BASOS ABS: 0.1 10*3/uL (ref 0.0–0.1)
Basophils Relative: 0.7 % (ref 0.0–3.0)
EOS ABS: 0.2 10*3/uL (ref 0.0–0.7)
Eosinophils Relative: 1.7 % (ref 0.0–5.0)
HEMATOCRIT: 44.1 % (ref 39.0–52.0)
HEMOGLOBIN: 15.5 g/dL (ref 13.0–17.0)
LYMPHS PCT: 11 % — AB (ref 12.0–46.0)
Lymphs Abs: 1 10*3/uL (ref 0.7–4.0)
MCHC: 35 g/dL (ref 30.0–36.0)
MCV: 91.7 fl (ref 78.0–100.0)
Monocytes Absolute: 1.1 10*3/uL — ABNORMAL HIGH (ref 0.1–1.0)
Monocytes Relative: 12 % (ref 3.0–12.0)
Neutro Abs: 6.7 10*3/uL (ref 1.4–7.7)
Neutrophils Relative %: 74.6 % (ref 43.0–77.0)
Platelets: 202 10*3/uL (ref 150.0–400.0)
RBC: 4.81 Mil/uL (ref 4.22–5.81)
RDW: 13.3 % (ref 11.5–15.5)
WBC: 9 10*3/uL (ref 4.0–10.5)

## 2017-08-01 LAB — TSH: TSH: 4.56 u[IU]/mL — ABNORMAL HIGH (ref 0.35–4.50)

## 2017-08-01 MED ORDER — LOSARTAN POTASSIUM-HCTZ 100-25 MG PO TABS: 0.5000 | ORAL_TABLET | Freq: Every day | ORAL | 1 refills | Status: DC

## 2017-08-01 NOTE — Patient Instructions (Signed)
Please make an appointment with Dr. Judi Cong for recheck in 2 weeks to recheck your blood pressure.   I have decreased your blood pressure medication Hyzaar: please take 1/2 pill daily.  Drink plenty of water and limit diet Dr. Malachi Bonds until you are feeling better.   If you have any questions or concerns, please don't hesitate to send me a message via MyChart or call the office at [phone]. Thank you for visiting with Kenneth Sellers today! It's our pleasure caring for you.   Syncope Syncope is when you temporarily lose consciousness. Syncope may also be called fainting or passing out. It is caused by a sudden decrease in blood flow to the brain. Even though most causes of syncope are not dangerous, syncope can be a sign of a serious medical problem. Signs that you may be about to faint include:  Feeling dizzy or light-headed.  Feeling nauseous.  Seeing all white or all black in your field of vision.  Having cold, clammy skin.  If you fainted, get medical help right away.Call your local emergency services (911 in the [place] Do not drive yourself to the hospital. Follow these instructions at home: Pay attention to any changes in your symptoms. Take these actions to help with your condition:  Have someone stay with you until you feel stable.  Do not drive, use machinery, or play sports until your health care provider says it is okay.  Keep all follow-up visits as told by your health care provider. This is important.  If you start to feel like you might faint, lie down right away and raise (elevate) your feet above the level of your heart. Breathe deeply and steadily. Wait until all of the symptoms have passed.  Drink enough fluid to keep your urine clear or pale yellow.  If you are taking blood pressure or heart medicine, get up slowly and take several minutes to sit and then stand. This can reduce dizziness.  Take over-the-counter and prescription medicines only as told by your health care  provider.  Get help right away if:  You have a severe headache.  You have unusual pain in your chest, abdomen, or back.  You are bleeding from your mouth or rectum, or you have black or tarry stool.  You have a very fast or irregular heartbeat (palpitations).  You have pain with breathing.  You faint once or repeatedly.  You have a seizure.  You are confused.  You have trouble walking.  You have severe weakness.  You have vision problems. These symptoms may represent a serious problem that is an emergency. Do not wait to see if your symptoms will go away. Get medical help right away. Call your local emergency services (911 in the [place] Do not drive yourself to the hospital. This information is not intended to replace advice given to you by your health care provider. Make sure you discuss any questions you have with your health care provider. Document Released: 01/25/2005 Document Revised: 07/03/2015 Document Reviewed: 10/09/2014 Elsevier Interactive Patient Education  [employer].

## 2017-08-01 NOTE — Progress Notes (Signed)
Subjective  CC:  Chief Complaint  Patient presents with  . Loss of Consciousness    Fainted today at 12:10pm, was outside, BP at 12:30pm today was 96/64    HPI: Kenneth Sellers is a 62 y.o. male who presents to the office today to address the problems listed above in the chief complaint.  Acute visit; PCP not available. New pt to me.  62 yo male presents after syncopal episode: was working outside helping neighbor burn bush; sat down and passed out. Awoke on floor. His wife is an Therapist, sports and came to assess him: blood pressure was low and he was diaphoretic at the time. He was thinking well. Feels shaky now but otherwise ok. Denies cp, palpitations, sob; the syncope was witnessed; no seizure activity reported. He was momentarily out. He was started on bp medications about 3 months ago. Bps have been running low since. He admits to some lightheadedness with standing or bending forward prior to this. No h/o CAD or DM. He is on cholesterol meds. He ate an apple fritter this am and drank diet dr. Malachi Bonds. No water intake. He denies any exertional sob or cp.  BP Readings from Last 3 Encounters:  08/01/17 110/72  04/27/17 124/72  03/30/17 (!) 160/96   Lab Results  Component Value Date   CREATININE 0.91 09/27/2016   BUN 12 09/27/2016   NA 140 09/27/2016   K 4.1 09/27/2016   CL 102 09/27/2016   CO2 28 09/27/2016   Orthostatic VS for the past 24 hrs:  BP- Lying Pulse- Lying BP- Sitting Pulse- Sitting BP- Standing at 0 minutes Pulse- Standing at 0 minutes  08/01/17 1354 118/70 92 94/62 99 90/60 105   EKG: NSR, same as compared to ekg 2018 and 2017. No acute changes.    Assessment  1. Vasovagal syncope   2. Iatrogenic hypotension   3. Hypotension due to drugs   4. Essential hypertension      Plan   syncope:  Due to hypotension and dehydration: decrease bp meds: may stop and document readings over next 2 weeks OR decrease to half tablet daily. rec good hydration with water. Check labs/lytes  due to meds. F/u with pcp in 2 weeks.   Follow up: Return in about 2 weeks (around 08/15/2017).   Orders Placed This Encounter  Procedures  . CBC with Differential/Platelet  . TSH  . Basic metabolic panel  . EKG 12-Lead   Meds ordered this encounter  Medications  . losartan-hydrochlorothiazide (HYZAAR) 100-25 MG tablet    Sig: Take 0.5 tablets by mouth daily.    Dispense:  90 tablet    Refill:  1      I reviewed the patients updated PMH, FH, and SocHx.    Patient Active Problem List   Diagnosis Date Noted  . Intractable episodic headache 03/30/2017  . Blurred vision, bilateral 03/30/2017  . Routine general medical examination at a health care facility 09/23/2015  . Essential hypertension 09/23/2015  . 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 Coarse tremors 09/23/2015  . OBSTRUCTIVE SLEEP APNEA 11/07/2009  . ECZEMA, ATOPIC 05/15/2008  . Hyperlipidemia with target LDL less than 130 01/17/2007  . ALLERGIC RHINITIS 01/17/2007  . ASTHMA 01/17/2007   Current Meds  Medication Sig  . AMBULATORY NON FORMULARY MEDICATION CPAP machine nightly  . Desoximetasone 0.05 % GEL Apply 1 application topically as needed (for eczema). Apply once a day as needed  . ezetimibe-simvastatin (VYTORIN) 10-20 MG tablet Take 1 tablet by mouth at  bedtime. Must keep 09/27/16 appt for future refills  . fluticasone (FLONASE) 50 MCG/ACT nasal spray   . fluticasone furoate-vilanterol (BREO ELLIPTA) 200-25 MCG/INH AEPB Inhale 1 puff into the lungs daily.  Marland Kitchen levocetirizine (XYZAL) 5 MG tablet Take 5 mg by mouth daily as needed.   Marland Kitchen losartan-hydrochlorothiazide (HYZAAR) 100-25 MG tablet Take 0.5 tablets by mouth daily.  . montelukast (SINGULAIR) 10 MG tablet Take 10 mg by mouth at bedtime.    . Multiple Vitamin (MULTIVITAMIN) tablet Take 1 tablet by mouth daily.  . VENTOLIN HFA 108 (90 Base) MCG/ACT inhaler INHALE 1 TO 2 PUFFS INTO THE LUNGS EVERY 6 HOURS AS NEEDED FOR WHEEZING OR SHORTNESS OF BREATH  . [DISCONTINUED]  losartan-hydrochlorothiazide (HYZAAR) 100-25 MG tablet TAKE 1 TABLET BY MOUTH DAILY.    Allergies: Patient has No Known Allergies. Family History: Patient family history includes Allergies in his sister; Arthritis in his sister; COPD in his brother; Diabetes in his brother, mother, and sister; Heart attack (age of onset: 55) in his brother; Heart attack (age of onset: 38) in his father; Heart disease in his brother; Heart failure in his mother; Hypertension in his brother and mother; Stroke (age of onset: 62) in his mother. Social History:  Patient  reports that he has never smoked. He has never used smokeless tobacco. He reports that he drinks about 1.2 oz of alcohol per week. He reports that he does not use drugs.  Review of Systems: Constitutional: Negative for fever malaise or anorexia Cardiovascular: negative for chest pain Respiratory: negative for SOB or persistent cough Gastrointestinal: negative for abdominal pain  Objective  Vitals: BP 110/72   Pulse 100   Temp 97.8 F (36.6 C) (Oral)   Resp 16   Ht 5' 8.5" (1.74 m)   Wt 192 lb 6.4 oz (87.3 kg)   SpO2 95%   BMI 28.83 kg/m  General: no acute distress , A&Ox3 HEENT: PEERL, conjunctiva normal, Oropharynx moist,neck is supple Cardiovascular:  RRR without murmur or gallop. No peripheral edema Respiratory:  Good breath sounds bilaterally, CTAB with normal respiratory effort Skin:  Warm, no rashes Neuro: essential tremor present     Commons side effects, risks, benefits, and alternatives for medications and treatment plan prescribed today were discussed, and the patient expressed understanding of the given instructions. Patient is instructed to call or message via MyChart if he/she has any questions or concerns regarding our treatment plan. No barriers to understanding were identified. We discussed Red Flag symptoms and signs in detail. Patient expressed understanding regarding what to do in case of urgent or emergency type  symptoms.   Medication list was reconciled, printed and provided to the patient in AVS. Patient instructions and summary information was reviewed with the patient as documented in the AVS. This note was prepared with assistance of Dragon voice recognition software. Occasional wrong-word or sound-a-like substitutions may have occurred due to the inherent limitations of voice recognition software

## 2017-08-08 MED FILL — LEVOCETIRIZINE 5 MG TABLET: 5 | 90 days supply | Qty: 90 | Fill #2

## 2017-08-08 MED FILL — MONTELUKAST SOD 10 MG TAB: 10 | 90 days supply | Qty: 90 | Fill #2

## 2017-08-10 ENCOUNTER — Ambulatory Visit (INDEPENDENT_AMBULATORY_CARE_PROVIDER_SITE_OTHER): Payer: BC Managed Care – PPO | Admitting: Internal Medicine

## 2017-08-10 ENCOUNTER — Encounter: Payer: Self-pay | Admitting: Internal Medicine

## 2017-08-10 VITALS — BP 132/70 | HR 96 | Temp 97.7°F | Resp 16 | Ht 68.5 in | Wt 197.2 lb

## 2017-08-10 DIAGNOSIS — I10 Essential (primary) hypertension: Secondary | ICD-10-CM

## 2017-08-10 DIAGNOSIS — K580 Irritable bowel syndrome with diarrhea: Secondary | ICD-10-CM | POA: Diagnosis not present

## 2017-08-10 DIAGNOSIS — R7989 Other specified abnormal findings of blood chemistry: Secondary | ICD-10-CM | POA: Insufficient documentation

## 2017-08-10 MED ORDER — RESTORA PO CAPS: 1.0000 | ORAL_CAPSULE | Freq: Every day | ORAL | 3 refills | Status: DC

## 2017-08-10 MED ORDER — AZILSARTAN MEDOXOMIL 80 MG PO TABS: 1.0000 | ORAL_TABLET | Freq: Every day | ORAL | 1 refills | Status: DC

## 2017-08-10 MED FILL — RESTORA CAPSULE: 30 days supply | Qty: 30 | Fill #0

## 2017-08-10 NOTE — Progress Notes (Signed)
Subjective:  Patient ID: Kenneth Sellers, male    DOB: 1955/05/12  Age: 62 y.o. MRN: [id number]  CC: Hypertension   HPI [name] presents for f/up - he was in his yard about 10 days ago and had a syncopal episode.  He was seen by another provider and found to be hypotensive with orthostatic changes.  At that time he cut his antihypertensive dose in half.  He continues to have episodes of mild hypotension.  One day prior to this visit he tells me his blood pressure was down to 113/80.  He has not had any more syncopal episodes and he denies dizziness, lightheadedness, or weakness.  He tells me that he and his wife had a stomach flu about 2 or 3 months ago.  He tells me most of the symptoms have resolved but now he complains that he has 1 loosely formed bowel movement a day.  He has not taken anything to treat the symptoms.  Outpatient Medications Prior to Visit  Medication Sig Dispense Refill  . AMBULATORY NON FORMULARY MEDICATION CPAP machine nightly    . Desoximetasone 0.05 % GEL Apply 1 application topically as needed (for eczema). Apply once a day as needed    . ezetimibe-simvastatin (VYTORIN) 10-20 MG tablet Take 1 tablet by mouth at bedtime. Must keep 09/27/16 appt for future refills 90 tablet 1  . fluticasone (FLONASE) 50 MCG/ACT nasal spray   3  . fluticasone furoate-vilanterol (BREO ELLIPTA) 200-25 MCG/INH AEPB Inhale 1 puff into the lungs daily.    Marland Kitchen levocetirizine (XYZAL) 5 MG tablet Take 5 mg by mouth daily as needed.     . mometasone (ELOCON) 0.1 % cream Apply 1 application topically daily.    . montelukast (SINGULAIR) 10 MG tablet Take 10 mg by mouth at bedtime.      . Multiple Vitamin (MULTIVITAMIN) tablet Take 1 tablet by mouth daily.    . VENTOLIN HFA 108 (90 Base) MCG/ACT inhaler INHALE 1 TO 2 PUFFS INTO THE LUNGS EVERY 6 HOURS AS NEEDED FOR WHEEZING OR SHORTNESS OF BREATH 18 g 5  . losartan-hydrochlorothiazide (HYZAAR) 100-25 MG tablet Take 0.5 tablets by mouth daily. 90  tablet 1   No facility-administered medications prior to visit.     ROS Review of Systems  Constitutional: Negative.  Negative for diaphoresis, fatigue and unexpected weight change.  HENT: Negative.   Eyes: Negative for visual disturbance.  Respiratory: Negative.  Negative for cough, chest tightness, shortness of breath and wheezing.   Cardiovascular: Negative.  Negative for chest pain, palpitations and leg swelling.  Gastrointestinal: Negative for abdominal pain, constipation, diarrhea, nausea and vomiting.  Endocrine: Negative.   Genitourinary: Negative.  Negative for decreased urine volume, difficulty urinating, dysuria, frequency, hematuria and urgency.  Musculoskeletal: Negative.   Skin: Negative.  Negative for color change and rash.  Allergic/Immunologic: Negative.   Neurological: Negative.  Negative for dizziness, weakness, light-headedness and numbness.  Hematological: Negative for adenopathy. Does not bruise/bleed easily.  Psychiatric/Behavioral: Negative.     Objective:  BP 132/70 (BP Location: Left Arm, Patient Position: Sitting, Cuff Size: Normal)   Pulse 96   Temp 97.7 F (36.5 C) (Oral)   Resp 16   Ht 5' 8.5" (1.74 m)   Wt 197 lb 4 oz (89.5 kg)   SpO2 97%   BMI 29.56 kg/m   BP Readings from Last 3 Encounters:  08/10/17 132/70  08/01/17 110/72  04/27/17 124/72    Wt Readings from Last 3 Encounters:  08/10/17 197 lb 4 oz (89.5 kg)  08/01/17 192 lb 6.4 oz (87.3 kg)  04/27/17 201 lb 12.8 oz (91.5 kg)    Physical Exam  Constitutional: He is oriented to person, place, and time. No distress.  HENT:  Mouth/Throat: Oropharynx is clear and moist. No oropharyngeal exudate.  Eyes: Conjunctivae are normal. No scleral icterus.  Neck: Normal range of motion. Neck supple. No JVD present. No thyromegaly present.  Cardiovascular: Normal rate, regular rhythm and normal heart sounds.  No murmur heard. Pulmonary/Chest: Effort normal and breath sounds normal. No  respiratory distress. He has no wheezes. He has no rales.  Abdominal: Soft. Bowel sounds are normal. He exhibits no mass. There is no hepatosplenomegaly. There is no tenderness. No hernia.  Musculoskeletal: Normal range of motion. He exhibits no edema, tenderness or deformity.  Lymphadenopathy:    He has no cervical adenopathy.  Neurological: He is alert and oriented to person, place, and time.  Skin: Skin is warm and dry. No rash noted. He is not diaphoretic.  Psychiatric: He has a normal mood and affect. His behavior is normal. Judgment and thought content normal.  Vitals reviewed.   Lab Results  Component Value Date   WBC 9.0 08/01/2017   HGB 15.5 08/01/2017   HCT 44.1 08/01/2017   PLT 202.0 08/01/2017   GLUCOSE 55 (L) 08/01/2017   CHOL 176 09/27/2016   TRIG 102.0 09/27/2016   HDL 54.50 09/27/2016   LDLCALC 101 (H) 09/27/2016   ALT 29 09/27/2016   AST 26 09/27/2016   NA 142 08/01/2017   K 3.6 08/01/2017   CL 102 08/01/2017   CREATININE 1.14 08/01/2017   BUN 14 08/01/2017   CO2 29 08/01/2017   TSH 4.56 (H) 08/01/2017   PSA 0.27 09/27/2016   HGBA1C 5.8 09/18/2014    [name] Contrast  Result Date: 03/31/2017 CLINICAL DATA:  62 y/o M; 3 weeks of severe headache with blurry vision in the right eye. EXAM: MRI HEAD WITHOUT CONTRAST TECHNIQUE: Multiplanar, multiecho pulse sequences of the brain and surrounding structures were obtained without intravenous contrast. COMPARISON:  None. FINDINGS: Brain: No acute infarction, hemorrhage, hydrocephalus, extra-axial collection or mass lesion. Vascular: Normal flow voids. Skull and upper cervical spine: Normal marrow signal. Sinuses/Orbits: Negative. Other: None. IMPRESSION: No acute intracranial abnormality. Unremarkable MRI of the brain for age. Electronically Signed   By: Kristine Garbe M.D.   On: 03/31/2017 20:38    Assessment & Plan:   Sohrab was seen today for hypertension.  Diagnoses and all orders for this  visit:  TSH elevation- His recent PSA was mildly elevated at 4.56.  He does not have any symptoms of hypothyroidism.  This is most likely subclinical hypothyroidism and does not need to be treated with thyroid replacement therapy.  I will recheck his TSH in about 3 months.  Essential hypertension- His blood pressure is adequately well controlled but I do not think he is tolerating the thiazide diuretic well.  I have asked him to stop taking his current regimen and will try to control his BP with a more potent ARB as a single agent. -     Azilsartan Medoxomil (EDARBI) 80 MG TABS; Take 1 tablet (80 mg total) by mouth daily.  Irritable bowel syndrome with diarrhea -     Probiotic Product (RESTORA) CAPS; Take 1 capsule by mouth daily.   I have discontinued Tadan L. Vankleeck's losartan-hydrochlorothiazide. I am also having him start on RESTORA and Azilsartan Medoxomil. Additionally, I am  having him maintain his levocetirizine, montelukast, multivitamin, AMBULATORY NON FORMULARY MEDICATION, Desoximetasone, mometasone, fluticasone furoate-vilanterol, VENTOLIN HFA, fluticasone, and ezetimibe-simvastatin.  Meds ordered this encounter  Medications  . Probiotic Product (RESTORA) CAPS    Sig: Take 1 capsule by mouth daily.    Dispense:  30 capsule    Refill:  3  . Azilsartan Medoxomil (EDARBI) 80 MG TABS    Sig: Take 1 tablet (80 mg total) by mouth daily.    Dispense:  90 tablet    Refill:  1     Follow-up: Return in about 3 months (around 11/10/2017).  Scarlette Calico, MD

## 2017-08-10 NOTE — Patient Instructions (Signed)

## 2017-08-23 MED FILL — BREO ELLIPTA 200-25 MCG INH: 200-25 | 30 days supply | Qty: 60 | Fill #6

## 2017-09-06 ENCOUNTER — Encounter: Payer: Self-pay | Admitting: Pulmonary Disease

## 2017-09-06 ENCOUNTER — Ambulatory Visit (INDEPENDENT_AMBULATORY_CARE_PROVIDER_SITE_OTHER): Payer: BC Managed Care – PPO | Admitting: Pulmonary Disease

## 2017-09-06 VITALS — BP 124/80 | HR 94 | Ht 70.0 in | Wt 199.0 lb

## 2017-09-06 DIAGNOSIS — G4733 Obstructive sleep apnea (adult) (pediatric): Secondary | ICD-10-CM | POA: Diagnosis not present

## 2017-09-06 DIAGNOSIS — Z9989 Dependence on other enabling machines and devices: Secondary | ICD-10-CM | POA: Diagnosis not present

## 2017-09-06 NOTE — Patient Instructions (Signed)
Follow up in 1 year.

## 2017-09-06 NOTE — Progress Notes (Signed)
[facility]  Chief Complaint  Patient presents with  . Follow-up    1 year ROV     Constitutional: BP 124/80 (BP Location: Left Arm, Cuff Size: Normal)   Pulse 94   Ht 5\' 10"  (1.778 m)   Wt 199 lb (90.3 kg)   SpO2 98%   BMI 28.55 kg/m   History of Present Illness: Kenneth Sellers is a 62 y.o. male with obstructive sleep apnea.  He is doing well with CPAP.  Has nasal mask and chin strap.  No issue with sinus congestion, post nasal drip, dry mouth, or sore throat.  He is followed by Dr. Donneta Romberg for asthma/allergies. He is doing well with daily breo and singulair.  He was having trouble with headaches.  Found to have high blood pressure and started on meds.  Then had episode of syncope because BP was too low.  Better now, and not having headaches.  Had trouble seeing from Rt eye.  Found to have cataract.  Might need surgery.  Has f/u with eye doctor scheduled.   Comprehensive Respiratory Exam:  Appearance - well kempt  ENMT - nasal mucosa moist, turbinates clear, midline nasal septum, no dental lesions, no gingival bleeding, no oral exudates, no tonsillar hypertrophy Neck - no masses, trachea midline, no thyromegaly, no elevation in JVP Respiratory - normal appearance of chest wall, normal respiratory effort w/o accessory muscle use, no dullness on percussion, no wheezing or rales CV - s1s2 regular rate and rhythm, no murmurs, no peripheral edema, no varicosities, radial pulses symmetric GI - soft, non tender, no masses, no hepatosplenomegaly Lymph - no adenopathy noted in neck and axillary areas MSK - normal muscle strength and tone, normal gait Ext - no cyanosis, clubbing, or joint inflammation noted Skin - no rashes, lesions, or ulcers Neuro - oriented to person, place, and time Psych - normal mood and affect   Assessment/Plan:  Obstructive sleep apnea. - he is compliant with CPAP and reports benefit from therapy - continue auto  CPAP  Cataract right eye. - advised him to d/w his ophthalmologist about using CPAP and how this might impact his recovery if he needs surgery  Allergic asthma. - followed by Dr. Melvia Heaps   Patient Instructions  Follow up in 1 year   Chesley Mires, MD [place] 09/06/2017, 10:50 AM  Flow Sheet  Sleep tests: PSG 08/26/02 >> AHI 59 Auto CPAP 08/06/17 to 09/04/17 >> used on 30 of 30 nights with average 7 hrs 19 min.  Average AHI 9.4 with median CPAP 7 and 95 th percentile CPAP 8 cm H2O  Past Medical History: He  has a past medical history of Allergic rhinitis, Allergy, Arthritis, Asthma, Diverticulosis of colon, Hyperlipidemia, Neuromuscular disorder ([place]), OSA (obstructive sleep apnea), Pneumonia (1988), Sleep apnea, and Tachycardia (2003).  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  Family History: His family history includes Allergies in his sister; Arthritis in his sister; COPD in his brother; Diabetes in his brother, mother, and sister; Heart attack (age of onset: 33) in his brother; Heart attack (age of onset: 26) in his father; Heart disease in his brother; Heart failure in his mother; Hypertension in his brother and mother; Stroke (age of onset: 72) in his mother.  Social History: He  reports that he has never  smoked. He has never used smokeless tobacco. He reports that he drinks about 1.2 oz of alcohol per week. He reports that he does not use drugs.  Medications: Allergies as of 09/06/2017   No Known Allergies     Medication List        Accurate as of 09/06/17 10:50 AM. Always use your most recent med list.          AMBULATORY NON FORMULARY MEDICATION CPAP machine nightly   Azilsartan Medoxomil 80 MG Tabs Commonly known as:  EDARBI Take 1 tablet  (80 mg total) by mouth daily.   BREO ELLIPTA 200-25 MCG/INH Aepb Generic drug:  fluticasone furoate-vilanterol Inhale 1 puff into the lungs daily.   Desoximetasone 0.05 % Gel Apply 1 application topically as needed (for eczema). Apply once a day as needed   ezetimibe-simvastatin 10-20 MG tablet Commonly known as:  VYTORIN Take 1 tablet by mouth at bedtime. Must keep 09/27/16 appt for future refills   fluticasone 50 MCG/ACT nasal spray Commonly known as:  FLONASE   levocetirizine 5 MG tablet Commonly known as:  XYZAL Take 5 mg by mouth daily as needed.   mometasone 0.1 % cream Commonly known as:  ELOCON Apply 1 application topically daily.   montelukast 10 MG tablet Commonly known as:  SINGULAIR Take 10 mg by mouth at bedtime.   multivitamin tablet Take 1 tablet by mouth daily.   RESTORA Caps Take 1 capsule by mouth daily.   VENTOLIN HFA 108 (90 Base) MCG/ACT inhaler Generic drug:  albuterol INHALE 1 TO 2 PUFFS INTO THE LUNGS EVERY 6 HOURS AS NEEDED FOR WHEEZING OR SHORTNESS OF BREATH

## 2017-09-08 MED FILL — RESTORA CAPSULE: 30 days supply | Qty: 30 | Fill #1

## 2017-09-12 ENCOUNTER — Telehealth: Payer: Self-pay

## 2017-09-12 NOTE — Telephone Encounter (Signed)
Copied from [place] [id number]. Topic: General - Other >> Sep 12, 2017 10:06 AM Keene Breath wrote: Reason for CRM: Patient called to request advice from doctor regarding his BP medication.  He is going out of town and would really like to know if he should continue this medication or cut the pills in half.  His BP has been running low and is not consistent.  Please advise.  CB# [phone].

## 2017-09-12 NOTE — Telephone Encounter (Signed)
BP: 94/66 when pt takes his medication. Pt states that his pressure is too low.   Pt would like to cut the 80 mg of Edarbi in half and see how his blood pressure is. Pt would like to try it for a week. Please advise.

## 2017-09-13 NOTE — Telephone Encounter (Signed)
Stop all BP meds and check BP

## 2017-09-23 MED FILL — BREO ELLIPTA 200-25 MCG INH: 200-25 | 30 days supply | Qty: 60 | Fill #0

## 2017-09-28 ENCOUNTER — Other Ambulatory Visit: Payer: Self-pay | Admitting: Internal Medicine

## 2017-09-28 DIAGNOSIS — E785 Hyperlipidemia, unspecified: Secondary | ICD-10-CM

## 2017-09-28 MED FILL — EZETIMIBE-SIMVASTATIN 10-20: 10-20 | 90 days supply | Qty: 90 | Fill #0

## 2017-10-05 DIAGNOSIS — G4733 Obstructive sleep apnea (adult) (pediatric): Secondary | ICD-10-CM | POA: Diagnosis not present

## 2017-10-11 MED FILL — RESTORA CAPSULE: 30 days supply | Qty: 30 | Fill #2

## 2017-10-13 DIAGNOSIS — G4733 Obstructive sleep apnea (adult) (pediatric): Secondary | ICD-10-CM | POA: Diagnosis not present

## 2017-10-17 DIAGNOSIS — H25041 Posterior subcapsular polar age-related cataract, right eye: Secondary | ICD-10-CM | POA: Diagnosis not present

## 2017-10-20 ENCOUNTER — Encounter: Payer: Self-pay | Admitting: Internal Medicine

## 2017-10-20 ENCOUNTER — Ambulatory Visit (INDEPENDENT_AMBULATORY_CARE_PROVIDER_SITE_OTHER): Payer: BC Managed Care – PPO | Admitting: Internal Medicine

## 2017-10-20 VITALS — BP 142/78 | HR 92 | Temp 98.2°F | Resp 16 | Ht 70.0 in | Wt 202.2 lb

## 2017-10-20 DIAGNOSIS — I10 Essential (primary) hypertension: Secondary | ICD-10-CM

## 2017-10-20 NOTE — Progress Notes (Signed)
Subjective:  Patient ID: Kenneth Sellers, male    DOB: [birth year]  Age: 62 y.o. MRN: [id number]  CC: Hypertension   HPI Kenneth Sellers presents for f/up - He had to stop taking the ARB.  His systolic blood pressure went down to 110 and he felt weak, dizzy, and lightheaded.  He has since been checking his blood pressure at home and all of the numbers have been less than 140/90.  He feels well today and offers no complaints.  Outpatient Medications Prior to Visit  Medication Sig Dispense Refill  . AMBULATORY NON FORMULARY MEDICATION CPAP machine nightly    . ezetimibe-simvastatin (VYTORIN) 10-20 MG tablet TAKE 1 TABLET BY MOUTH AT BEDTIME. 90 tablet 1  . fluticasone (FLONASE) 50 MCG/ACT nasal spray   3  . fluticasone furoate-vilanterol (BREO ELLIPTA) 200-25 MCG/INH AEPB Inhale 1 puff into the lungs daily.    Marland Kitchen levocetirizine (XYZAL) 5 MG tablet Take 5 mg by mouth daily as needed.     . montelukast (SINGULAIR) 10 MG tablet Take 10 mg by mouth at bedtime.      . Probiotic Product (RESTORA) CAPS Take 1 capsule by mouth daily. 30 capsule 3  . VENTOLIN HFA 108 (90 Base) MCG/ACT inhaler INHALE 1 TO 2 PUFFS INTO THE LUNGS EVERY 6 HOURS AS NEEDED FOR WHEEZING OR SHORTNESS OF BREATH 18 g 5  . Azilsartan Medoxomil (EDARBI) 80 MG TABS Take 1 tablet (80 mg total) by mouth daily. 90 tablet 1  . Desoximetasone 0.05 % GEL Apply 1 application topically as needed (for eczema). Apply once a day as needed    . mometasone (ELOCON) 0.1 % cream Apply 1 application topically daily.    . Multiple Vitamin (MULTIVITAMIN) tablet Take 1 tablet by mouth daily.     No facility-administered medications prior to visit.     ROS Review of Systems  Constitutional: Negative for diaphoresis and fatigue.  HENT: Negative.   Eyes: Negative for visual disturbance.  Respiratory: Negative for cough, chest tightness, shortness of breath and wheezing.   Cardiovascular: Negative for chest pain, palpitations and leg swelling.    Gastrointestinal: Negative for abdominal pain, constipation, diarrhea, nausea and vomiting.  Endocrine: Negative.   Genitourinary: Negative.  Negative for difficulty urinating.  Musculoskeletal: Negative.  Negative for arthralgias and myalgias.  Skin: Negative.  Negative for color change, pallor and rash.  Neurological: Negative.  Negative for dizziness, weakness and light-headedness.  Hematological: Negative for adenopathy. Does not bruise/bleed easily.  Psychiatric/Behavioral: Negative.     Objective:  BP (!) 142/78 (BP Location: Left Arm, Patient Position: Sitting, Cuff Size: Normal)   Pulse 92   Temp 98.2 F (36.8 C) (Oral)   Resp 16   Ht 5\' 10"  (1.778 m)   Wt 202 lb 4 oz (91.7 kg)   SpO2 99%   BMI 29.02 kg/m   BP Readings from Last 3 Encounters:  10/20/17 (!) 142/78  09/06/17 124/80  08/10/17 132/70    Wt Readings from Last 3 Encounters:  10/20/17 202 lb 4 oz (91.7 kg)  09/06/17 199 lb (90.3 kg)  08/10/17 197 lb 4 oz (89.5 kg)    Physical Exam  Constitutional: He is oriented to person, place, and time. No distress.  HENT:  Mouth/Throat: Oropharynx is clear and moist. No oropharyngeal exudate.  Eyes: Conjunctivae are normal. No scleral icterus.  Neck: Normal range of motion. Neck supple. No JVD present. No thyromegaly present.  Cardiovascular: Normal rate, regular rhythm and normal heart sounds. Exam  reveals no gallop and no friction rub.  No murmur heard. Pulmonary/Chest: Effort normal and breath sounds normal. No respiratory distress. He has no wheezes. He has no rales.  Abdominal: Soft. Bowel sounds are normal. He exhibits no mass. There is no tenderness.  Musculoskeletal: Normal range of motion. He exhibits no edema, tenderness or deformity.  Lymphadenopathy:    He has no cervical adenopathy.  Neurological: He is alert and oriented to person, place, and time.  Skin: Skin is warm and dry. No rash noted. He is not diaphoretic.  Psychiatric: He has a normal  mood and affect. His behavior is normal. Judgment and thought content normal.  Vitals reviewed.   Lab Results  Component Value Date   WBC 9.0 08/01/2017   HGB 15.5 08/01/2017   HCT 44.1 08/01/2017   PLT 202.0 08/01/2017   GLUCOSE 55 (L) 08/01/2017   CHOL 176 09/27/2016   TRIG 102.0 09/27/2016   HDL 54.50 09/27/2016   LDLCALC 101 (H) 09/27/2016   ALT 29 09/27/2016   AST 26 09/27/2016   NA 142 08/01/2017   K 3.6 08/01/2017   CL 102 08/01/2017   CREATININE 1.14 08/01/2017   BUN 14 08/01/2017   CO2 29 08/01/2017   TSH 4.56 (H) 08/01/2017   PSA 0.27 09/27/2016   HGBA1C 5.8 09/18/2014    [name] Contrast  Result Date: 03/31/2017 CLINICAL DATA:  62 y/o M; 3 weeks of severe headache with blurry vision in the right eye. EXAM: MRI HEAD WITHOUT CONTRAST TECHNIQUE: Multiplanar, multiecho pulse sequences of the brain and surrounding structures were obtained without intravenous contrast. COMPARISON:  None. FINDINGS: Brain: No acute infarction, hemorrhage, hydrocephalus, extra-axial collection or mass lesion. Vascular: Normal flow voids. Skull and upper cervical spine: Normal marrow signal. Sinuses/Orbits: Negative. Other: None. IMPRESSION: No acute intracranial abnormality. Unremarkable MRI of the brain for age. Electronically Signed   By: Kristine Garbe M.D.   On: 03/31/2017 20:38    Assessment & Plan:   Kenneth Sellers was seen today for hypertension.  Diagnoses and all orders for this visit:  White coat syndrome with diagnosis of hypertension- He does not tolerate antihypertensives.  His blood pressure is high in the office but normal at home.  This is all consistent with a whitecoat phenomenon.  At this time I have advised him to work on his lifestyle modifications and will refrain from prescribing an antihypertensive at this time.   I have discontinued Kenneth Sellers's multivitamin, Desoximetasone, mometasone, and Azilsartan Medoxomil. I am also having him maintain his  levocetirizine, montelukast, AMBULATORY NON FORMULARY MEDICATION, fluticasone furoate-vilanterol, VENTOLIN HFA, fluticasone, RESTORA, and ezetimibe-simvastatin.  No orders of the defined types were placed in this encounter.    Follow-up: Return if symptoms worsen or fail to improve.  Scarlette Calico, MD

## 2017-10-20 NOTE — Patient Instructions (Signed)

## 2017-10-27 ENCOUNTER — Ambulatory Visit: Payer: BC Managed Care – PPO | Admitting: Internal Medicine

## 2017-11-02 MED FILL — BREO ELLIPTA 200-25 MCG INH: 200-25 | 30 days supply | Qty: 60 | Fill #1

## 2017-11-09 DIAGNOSIS — H25811 Combined forms of age-related cataract, right eye: Secondary | ICD-10-CM | POA: Diagnosis not present

## 2017-11-09 DIAGNOSIS — H2511 Age-related nuclear cataract, right eye: Secondary | ICD-10-CM | POA: Diagnosis not present

## 2017-11-09 DIAGNOSIS — H25041 Posterior subcapsular polar age-related cataract, right eye: Secondary | ICD-10-CM | POA: Diagnosis not present

## 2017-11-09 MED FILL — MOXIFLOXACIN HCL 0.5 % SOLN: 0.5 | 15 days supply | Qty: 3 | Fill #0

## 2017-11-09 MED FILL — PREDNISOLONE AC 1% EYE DROP: 1 | 15 days supply | Qty: 10 | Fill #0

## 2017-11-09 MED FILL — NEO/POLY/DEXAMET EYE OINT: 3.5-10000-0 | 14 days supply | Qty: 4 | Fill #0

## 2017-11-10 ENCOUNTER — Encounter: Payer: BC Managed Care – PPO | Admitting: Internal Medicine

## 2017-11-11 MED FILL — RESTORA CAPSULE: 30 days supply | Qty: 30 | Fill #3

## 2017-11-11 MED FILL — LEVOCETIRIZINE 5 MG TABLET: 5 | 90 days supply | Qty: 90 | Fill #3

## 2017-11-16 ENCOUNTER — Ambulatory Visit (INDEPENDENT_AMBULATORY_CARE_PROVIDER_SITE_OTHER): Payer: BC Managed Care – PPO | Admitting: Internal Medicine

## 2017-11-16 ENCOUNTER — Other Ambulatory Visit (INDEPENDENT_AMBULATORY_CARE_PROVIDER_SITE_OTHER): Payer: BC Managed Care – PPO

## 2017-11-16 ENCOUNTER — Encounter: Payer: Self-pay | Admitting: Internal Medicine

## 2017-11-16 VITALS — BP 150/100 | HR 83 | Temp 98.1°F | Resp 16 | Ht 70.0 in | Wt 200.5 lb

## 2017-11-16 DIAGNOSIS — Z23 Encounter for immunization: Secondary | ICD-10-CM | POA: Diagnosis not present

## 2017-11-16 DIAGNOSIS — K580 Irritable bowel syndrome with diarrhea: Secondary | ICD-10-CM

## 2017-11-16 DIAGNOSIS — Z0000 Encounter for general adult medical examination without abnormal findings: Secondary | ICD-10-CM

## 2017-11-16 DIAGNOSIS — E785 Hyperlipidemia, unspecified: Secondary | ICD-10-CM

## 2017-11-16 DIAGNOSIS — I10 Essential (primary) hypertension: Secondary | ICD-10-CM

## 2017-11-16 DIAGNOSIS — Z0001 Encounter for general adult medical examination with abnormal findings: Secondary | ICD-10-CM

## 2017-11-16 DIAGNOSIS — R7989 Other specified abnormal findings of blood chemistry: Secondary | ICD-10-CM

## 2017-11-16 LAB — COMPREHENSIVE METABOLIC PANEL
ALBUMIN: 4.6 g/dL (ref 3.5–5.2)
ALK PHOS: 50 U/L (ref 39–117)
ALT: 23 U/L (ref 0–53)
AST: 20 U/L (ref 0–37)
BILIRUBIN TOTAL: 0.9 mg/dL (ref 0.2–1.2)
BUN: 15 mg/dL (ref 6–23)
CALCIUM: 9.7 mg/dL (ref 8.4–10.5)
CO2: 31 mEq/L (ref 19–32)
Chloride: 101 mEq/L (ref 96–112)
Creatinine, Ser: 1.01 mg/dL (ref 0.40–1.50)
GFR: 79.02 mL/min (ref >60.00)
GLUCOSE: 107 mg/dL — AB (ref 70–99)
Potassium: 3.7 mEq/L (ref 3.5–5.1)
Sodium: 140 mEq/L (ref 135–145)
TOTAL PROTEIN: 6.9 g/dL (ref 6.0–8.3)

## 2017-11-16 LAB — URINALYSIS, ROUTINE W REFLEX MICROSCOPIC
Bilirubin Urine: NEGATIVE
HGB URINE DIPSTICK: NEGATIVE
Ketones, ur: NEGATIVE
Leukocytes, UA: NEGATIVE
Nitrite: NEGATIVE
RBC / HPF: NONE SEEN (ref 0–?)
TOTAL PROTEIN, URINE-UPE24: NEGATIVE
Urine Glucose: NEGATIVE
Urobilinogen, UA: 0.2 (ref 0.0–1.0)
pH: 5.5 (ref 5.0–8.0)

## 2017-11-16 LAB — LIPID PANEL
CHOLESTEROL: 148 mg/dL (ref 0–200)
HDL: 50.9 mg/dL (ref >39.00)
LDL Cholesterol: 80 mg/dL (ref 0–99)
NONHDL: 97.1
Total CHOL/HDL Ratio: 3
Triglycerides: 84 mg/dL (ref 0.0–149.0)
VLDL: 16.8 mg/dL (ref 0.0–40.0)

## 2017-11-16 LAB — VITAMIN D 25 HYDROXY (VIT D DEFICIENCY, FRACTURES): VITD: 39.26 ng/mL (ref 30.00–100.00)

## 2017-11-16 LAB — CBC WITH DIFFERENTIAL/PLATELET
BASOS PCT: 0.7 % (ref 0.0–3.0)
Basophils Absolute: 0 10*3/uL (ref 0.0–0.1)
EOS PCT: 3.6 % (ref 0.0–5.0)
Eosinophils Absolute: 0.2 10*3/uL (ref 0.0–0.7)
HCT: 48 % (ref 39.0–52.0)
Hemoglobin: 16.2 g/dL (ref 13.0–17.0)
LYMPHS ABS: 1.5 10*3/uL (ref 0.7–4.0)
Lymphocytes Relative: 26.2 % (ref 12.0–46.0)
MCHC: 33.9 g/dL (ref 30.0–36.0)
MCV: 91.4 fl (ref 78.0–100.0)
MONO ABS: 0.8 10*3/uL (ref 0.1–1.0)
Monocytes Relative: 14.4 % — ABNORMAL HIGH (ref 3.0–12.0)
NEUTROS PCT: 55.1 % (ref 43.0–77.0)
Neutro Abs: 3.2 10*3/uL (ref 1.4–7.7)
PLATELETS: 189 10*3/uL (ref 150.0–400.0)
RBC: 5.25 Mil/uL (ref 4.22–5.81)
RDW: 13 % (ref 11.5–15.5)
WBC: 5.7 10*3/uL (ref 4.0–10.5)

## 2017-11-16 LAB — PSA: PSA: 0.31 ng/mL (ref 0.10–4.00)

## 2017-11-16 MED ORDER — ALISKIREN FUMARATE 150 MG PO TABS: 150.0000 mg | ORAL_TABLET | Freq: Every day | ORAL | 1 refills | Status: DC

## 2017-11-16 MED ORDER — RESTORA PO CAPS: 1.0000 | ORAL_CAPSULE | Freq: Every day | ORAL | 1 refills | Status: DC

## 2017-11-16 MED FILL — ALISKIREN FUMARATE 150 MG T: 150 | 90 days supply | Qty: 90 | Fill #0

## 2017-11-16 NOTE — Patient Instructions (Signed)

## 2017-11-16 NOTE — Progress Notes (Signed)
Subjective:  Patient ID: Kenneth Sellers, male    DOB: January 23, 1956  Age: 62 y.o. MRN: [id number]  CC: Annual Exam; Hypertension; and Hyperlipidemia   HPI Kenneth Sellers presents for a CPX.  He remains concerned that his blood pressure is not well controlled.  He has not tolerated ARB's or diuretics due to dizziness and lightheadedness.  He denies any recent headache, chest pain, shortness of breath, DOE, palpitations, edema, or fatigue.  His bowel movements have normalized since he started taking a probiotic.  Outpatient Medications Prior to Visit  Medication Sig Dispense Refill  . AMBULATORY NON FORMULARY MEDICATION CPAP machine nightly    . ezetimibe-simvastatin (VYTORIN) 10-20 MG tablet TAKE 1 TABLET BY MOUTH AT BEDTIME. 90 tablet 1  . fluticasone (FLONASE) 50 MCG/ACT nasal spray   3  . fluticasone furoate-vilanterol (BREO ELLIPTA) 200-25 MCG/INH AEPB Inhale 1 puff into the lungs daily.    Marland Kitchen levocetirizine (XYZAL) 5 MG tablet Take 5 mg by mouth daily as needed.     . montelukast (SINGULAIR) 10 MG tablet Take 10 mg by mouth at bedtime.      . moxifloxacin (VIGAMOX) 0.5 % ophthalmic solution   1  . neomycin-polymyxin b-dexamethasone (MAXITROL) 3.5-10000-0.1 OINT   0  . prednisoLONE acetate (PRED FORTE) 1 % ophthalmic suspension   1  . VENTOLIN HFA 108 (90 Base) MCG/ACT inhaler INHALE 1 TO 2 PUFFS INTO THE LUNGS EVERY 6 HOURS AS NEEDED FOR WHEEZING OR SHORTNESS OF BREATH 18 g 5  . Probiotic Product (RESTORA) CAPS Take 1 capsule by mouth daily. 30 capsule 3   No facility-administered medications prior to visit.     ROS Review of Systems  Constitutional: Negative for diaphoresis, fatigue and unexpected weight change.  HENT: Negative.   Respiratory: Negative.  Negative for cough, chest tightness, shortness of breath and wheezing.   Cardiovascular: Negative for chest pain, palpitations and leg swelling.  Gastrointestinal: Negative for abdominal pain, constipation, diarrhea, nausea and  vomiting.  Genitourinary: Negative.  Negative for difficulty urinating, dysuria, hematuria, penile swelling, scrotal swelling, testicular pain and urgency.  Musculoskeletal: Negative for arthralgias and myalgias.  Skin: Negative.  Negative for color change.  Neurological: Negative.  Negative for dizziness, weakness and light-headedness.  Hematological: Negative for adenopathy. Does not bruise/bleed easily.  Psychiatric/Behavioral: Negative.     Objective:  BP (!) 150/100 (BP Location: Left Arm, Patient Position: Sitting, Cuff Size: Large)   Pulse 83   Temp 98.1 F (36.7 C) (Oral)   Resp 16   Ht 5\' 10"  (1.778 m)   Wt 200 lb 8 oz (90.9 kg)   SpO2 97%   BMI 28.77 kg/m   BP Readings from Last 3 Encounters:  11/16/17 (!) 150/100  10/20/17 (!) 142/78  09/06/17 124/80    Wt Readings from Last 3 Encounters:  11/16/17 200 lb 8 oz (90.9 kg)  10/20/17 202 lb 4 oz (91.7 kg)  09/06/17 199 lb (90.3 kg)    Physical Exam  Constitutional: He is oriented to person, place, and time. No distress.  HENT:  Mouth/Throat: Oropharynx is clear and moist. No oropharyngeal exudate.  Eyes: Conjunctivae are normal. No scleral icterus.  Neck: Normal range of motion. Neck supple. No JVD present. No thyromegaly present.  Cardiovascular: Normal rate, regular rhythm and normal heart sounds. Exam reveals no gallop.  No murmur heard. Pulmonary/Chest: Effort normal and breath sounds normal. No respiratory distress. He has no wheezes. He has no rales.  Abdominal: Soft. Bowel sounds are normal.  He exhibits no mass. There is no hepatosplenomegaly. There is no tenderness. Hernia confirmed negative in the right inguinal area and confirmed negative in the left inguinal area.  Genitourinary: Rectum normal, prostate normal and testes normal. Rectal exam shows no external hemorrhoid, no internal hemorrhoid, no fissure, no mass, no tenderness, anal tone normal and guaiac negative stool. Prostate is not enlarged and not  tender. Right testis shows no mass, no swelling and no tenderness. Left testis shows no mass, no swelling and no tenderness. Uncircumcised. No phimosis, paraphimosis, hypospadias, penile erythema or penile tenderness. No discharge found.  Musculoskeletal: Normal range of motion. He exhibits no edema, tenderness or deformity.  Lymphadenopathy:    He has no cervical adenopathy. No inguinal adenopathy noted on the right or left side.  Neurological: He is alert and oriented to person, place, and time.  Skin: Skin is warm and dry. He is not diaphoretic.  Psychiatric: He has a normal mood and affect.  Vitals reviewed.   Lab Results  Component Value Date   WBC 5.7 11/16/2017   HGB 16.2 11/16/2017   HCT 48.0 11/16/2017   PLT 189.0 11/16/2017   GLUCOSE 107 (H) 11/16/2017   CHOL 148 11/16/2017   TRIG 84.0 11/16/2017   HDL 50.90 11/16/2017   LDLCALC 80 11/16/2017   ALT 23 11/16/2017   AST 20 11/16/2017   NA 140 11/16/2017   K 3.7 11/16/2017   CL 101 11/16/2017   CREATININE 1.01 11/16/2017   BUN 15 11/16/2017   CO2 31 11/16/2017   TSH 4.44 11/16/2017   PSA 0.31 11/16/2017   HGBA1C 5.8 09/18/2014    [name] Contrast  Result Date: 03/31/2017 CLINICAL DATA:  62 y/o M; 3 weeks of severe headache with blurry vision in the right eye. EXAM: MRI HEAD WITHOUT CONTRAST TECHNIQUE: Multiplanar, multiecho pulse sequences of the brain and surrounding structures were obtained without intravenous contrast. COMPARISON:  None. FINDINGS: Brain: No acute infarction, hemorrhage, hydrocephalus, extra-axial collection or mass lesion. Vascular: Normal flow voids. Skull and upper cervical spine: Normal marrow signal. Sinuses/Orbits: Negative. Other: None. IMPRESSION: No acute intracranial abnormality. Unremarkable MRI of the brain for age. Electronically Signed   By: Kristine Garbe M.D.   On: 03/31/2017 20:38    Assessment & Plan:   Srihith was seen today for annual exam, hypertension and  hyperlipidemia.  Diagnoses and all orders for this visit:  Need for influenza vaccination -     Flu Vaccine QUAD 36+ mos IM  Routine general medical examination at a health care facility- Exam completed, labs reviewed, vaccines reviewed and updated, screening for colon cancer is up-to-date, patient education material was given. -     Lipid panel; Future -     PSA; Future  TSH elevation- His TFTs are normal now. -     Thyroid Panel With TSH; Future  White coat syndrome with diagnosis of hypertension- His blood pressure is not adequately well controlled.  His labs are negative for secondary causes or endorgan damage.  Will try a direct renin inhibitor. -     CBC with Differential/Platelet; Future -     Thyroid Panel With TSH; Future -     Urinalysis, Routine w reflex microscopic; Future -     VITAMIN D 25 Hydroxy (Vit-D Deficiency, Fractures); Future -     Comprehensive metabolic panel; Future -     aliskiren (TEKTURNA) 150 MG tablet; Take 1 tablet (150 mg total) by mouth daily.  Hyperlipidemia with target LDL less  than 130- He has achieved his LDL goal and is doing well on the statin. -     Thyroid Panel With TSH; Future -     Comprehensive metabolic panel; Future  Irritable bowel syndrome with diarrhea -     Probiotic Product (RESTORA) CAPS; Take 1 capsule by mouth daily.   I am having Kenneth Sellers start on aliskiren. I am also having him maintain his levocetirizine, montelukast, AMBULATORY NON FORMULARY MEDICATION, fluticasone furoate-vilanterol, VENTOLIN HFA, fluticasone, ezetimibe-simvastatin, prednisoLONE acetate, moxifloxacin, neomycin-polymyxin b-dexamethasone, and RESTORA.  Meds ordered this encounter  Medications  . aliskiren (TEKTURNA) 150 MG tablet    Sig: Take 1 tablet (150 mg total) by mouth daily.    Dispense:  90 tablet    Refill:  1  . Probiotic Product (RESTORA) CAPS    Sig: Take 1 capsule by mouth daily.    Dispense:  90 capsule    Refill:  1      Follow-up: Return in about 4 months (around 03/19/2018).  Scarlette Calico, MD

## 2017-11-17 ENCOUNTER — Encounter: Payer: Self-pay | Admitting: Internal Medicine

## 2017-11-17 LAB — THYROID PANEL WITH TSH
FREE THYROXINE INDEX: 2.4 (ref 1.4–3.8)
T3 UPTAKE: 30 % (ref 22–35)
T4 TOTAL: 8 ug/dL (ref 4.9–10.5)
TSH: 4.44 mIU/L (ref 0.40–4.50)

## 2017-11-17 MED FILL — MONTELUKAST SOD 10 MG TAB: 10 | 90 days supply | Qty: 90 | Fill #3

## 2017-12-08 MED FILL — BREO ELLIPTA 200-25 MCG INH: 200-25 | 30 days supply | Qty: 60 | Fill #2

## 2017-12-14 MED FILL — RESTORA CAPSULE: 90 days supply | Qty: 90 | Fill #0

## 2017-12-28 MED FILL — EZETIMIBE-SIMVASTATIN 10-20: 10-20 | 90 days supply | Qty: 90 | Fill #1

## 2018-01-09 MED FILL — BREO ELLIPTA 200-25 MCG INH: 200-25 | 30 days supply | Qty: 60 | Fill #3

## 2018-01-25 MED FILL — EPINEPHRINE 0.3 MG AUTO-INJ: 0.3 | 30 days supply | Qty: 2 | Fill #0

## 2018-02-02 ENCOUNTER — Other Ambulatory Visit: Payer: Self-pay

## 2018-02-02 ENCOUNTER — Encounter: Payer: Self-pay | Admitting: Family Medicine

## 2018-02-02 ENCOUNTER — Ambulatory Visit (INDEPENDENT_AMBULATORY_CARE_PROVIDER_SITE_OTHER): Payer: BC Managed Care – PPO | Admitting: Family Medicine

## 2018-02-02 VITALS — BP 138/98 | HR 99 | Temp 98.9°F | Resp 20 | Ht 70.0 in | Wt 195.0 lb

## 2018-02-02 DIAGNOSIS — J209 Acute bronchitis, unspecified: Secondary | ICD-10-CM | POA: Diagnosis not present

## 2018-02-02 DIAGNOSIS — J4541 Moderate persistent asthma with (acute) exacerbation: Secondary | ICD-10-CM | POA: Diagnosis not present

## 2018-02-02 MED ORDER — IPRATROPIUM-ALBUTEROL 0.5-2.5 (3) MG/3ML IN SOLN
3.0000 mL | Freq: Once | RESPIRATORY_TRACT | Status: AC
  Administered 2018-02-02: 3 mL via RESPIRATORY_TRACT

## 2018-02-02 MED ORDER — PREDNISONE 10 MG PO TABS: ORAL_TABLET | ORAL | 0 refills | Status: DC

## 2018-02-02 MED ORDER — ALBUTEROL SULFATE HFA 108 (90 BASE) MCG/ACT IN AERS: INHALATION_SPRAY | RESPIRATORY_TRACT | 0 refills | Status: DC

## 2018-02-02 MED ORDER — HYDROCODONE-HOMATROPINE 5-1.5 MG/5ML PO SYRP: 5.0000 mL | ORAL_SOLUTION | Freq: Three times a day (TID) | ORAL | 0 refills | Status: DC | PRN

## 2018-02-02 MED ORDER — AMOXICILLIN 875 MG PO TABS: 875.0000 mg | ORAL_TABLET | Freq: Two times a day (BID) | ORAL | 0 refills | Status: DC

## 2018-02-02 MED FILL — AMOXICILLIN 875 MG TABS: 875 | 10 days supply | Qty: 20 | Fill #0

## 2018-02-02 MED FILL — predniSONE 10 MG TABS: 10 | 9 days supply | Qty: 18 | Fill #0

## 2018-02-02 MED FILL — HYDROCODONE-HOMATROPINE SYR: 5-1.5 | 8 days supply | Qty: 120 | Fill #0

## 2018-02-02 MED FILL — ALBUTEROL SULFATE HFA 108 (: 108 (90 BAS | 25 days supply | Qty: 18 | Fill #0

## 2018-02-02 NOTE — Patient Instructions (Addendum)
Follow up as needed or as scheduled START the Amoxicillin twice daily- take w/ food Drink plenty of fluids REST! START the Prednisone taper as directed- 3 pills at the same time x3 days, then 2 tabs at the same time x3 days, and then 1 tab daily.  Take w/ food Use the cough syrup as needed Use the Albuterol inhaler as needed Call with any questions or concerns Hang in there! Happy New Year!

## 2018-02-02 NOTE — Progress Notes (Signed)
   Subjective:    Patient ID: Kenneth Sellers, male    DOB: 05/18/1955, 62 y.o.   MRN: [id number]  HPI Cough- granddaughter w/ RSV.  Pt woke w/ 'sniffles' 1 week ago.  Saw Dr Donneta Romberg (who treats asthma) who gave injxn of steroids.  sxs improved until 12/23.  Got Mucinex which triggered cough, 'splitting headache'.  Woke this AM w/ burning cough, chest tightness.  Hx of chronic bronchitis.  On Breo regularly.  Was using Albuterol until inhaler broke on 12/24.  Using Flonase BID.  No fevers.   Review of Systems For ROS see HPI     Objective:   Physical Exam Vitals signs reviewed.  Constitutional:      General: He is not in acute distress.    Appearance: He is well-developed.  HENT:     Head: Normocephalic and atraumatic.     Right Ear: Tympanic membrane normal.     Left Ear: Tympanic membrane normal.     Nose: No mucosal edema or rhinorrhea.     Right Sinus: No maxillary sinus tenderness or frontal sinus tenderness.     Left Sinus: No maxillary sinus tenderness or frontal sinus tenderness.     Mouth/Throat:     Pharynx: No oropharyngeal exudate or posterior oropharyngeal erythema.  Eyes:     Conjunctiva/sclera: Conjunctivae normal.     Pupils: Pupils are equal, round, and reactive to light.  Neck:     Musculoskeletal: Normal range of motion and neck supple.  Cardiovascular:     Rate and Rhythm: Normal rate and regular rhythm.     Heart sounds: Normal heart sounds.  Pulmonary:     Effort: Pulmonary effort is normal. No respiratory distress.     Breath sounds: No wheezing.     Comments: Decreased BS throughout- improved s/p neb tx + hacking cough- improved s/p neb tx Lymphadenopathy:     Cervical: No cervical adenopathy.  Skin:    General: Skin is warm and dry.  Neurological:     General: No focal deficit present.           Assessment & Plan:  Chronic asthmatic bronchitis- new to provider, recurrent problem for pt.  He reports that Dr Donneta Romberg typically covers w/ abx,  prednisone, and inhalers.  Asking for refill on Tussionex from 2017.  Prescriptions sent.  Cough and chest tightness improved s/p duoneb in office.  Reviewed supportive care and red flags that should prompt return.  Pt expressed understanding and is in agreement w/ plan.

## 2018-02-06 ENCOUNTER — Ambulatory Visit (INDEPENDENT_AMBULATORY_CARE_PROVIDER_SITE_OTHER): Payer: BC Managed Care – PPO | Admitting: Family Medicine

## 2018-02-06 ENCOUNTER — Ambulatory Visit (INDEPENDENT_AMBULATORY_CARE_PROVIDER_SITE_OTHER): Payer: BC Managed Care – PPO

## 2018-02-06 ENCOUNTER — Encounter: Payer: Self-pay | Admitting: Family Medicine

## 2018-02-06 ENCOUNTER — Other Ambulatory Visit: Payer: Self-pay

## 2018-02-06 VITALS — BP 124/84 | HR 106 | Temp 98.8°F | Resp 20 | Ht 70.0 in | Wt 195.0 lb

## 2018-02-06 DIAGNOSIS — J4541 Moderate persistent asthma with (acute) exacerbation: Secondary | ICD-10-CM | POA: Diagnosis not present

## 2018-02-06 MED ORDER — IPRATROPIUM-ALBUTEROL 0.5-2.5 (3) MG/3ML IN SOLN
3.0000 mL | RESPIRATORY_TRACT | Status: AC
  Administered 2018-02-06: 3 mL via RESPIRATORY_TRACT

## 2018-02-06 MED ORDER — PREDNISONE 20 MG PO TABS: ORAL_TABLET | ORAL | 0 refills | Status: DC

## 2018-02-06 MED ORDER — AZITHROMYCIN 250 MG PO TABS: ORAL_TABLET | ORAL | 0 refills | Status: DC

## 2018-02-06 MED ORDER — CEFTRIAXONE SODIUM 1 G IJ SOLR
1.0000 g | Freq: Once | INTRAMUSCULAR | Status: AC
  Administered 2018-02-06: 1 g via INTRAMUSCULAR

## 2018-02-06 MED FILL — BREO ELLIPTA 200-25 MCG INH: 200-25 | 30 days supply | Qty: 60 | Fill #4

## 2018-02-06 MED FILL — AZITHROMYCIN 250 MG TABLET: 250 | 30 days supply | Qty: 6 | Fill #0

## 2018-02-06 MED FILL — predniSONE 20 MG TABS: 20 | 5 days supply | Qty: 15 | Fill #0

## 2018-02-06 MED FILL — FLUTICASONE PROP 50 MCG SPR: 50 | 30 days supply | Qty: 16 | Fill #0

## 2018-02-06 NOTE — Progress Notes (Signed)
Subjective  CC:  Chief Complaint  Patient presents with  . Cough    Symptoms started with 01/25/18 with cold symptoms.. Saw Dr. Birdie Riddle 02/02/18, medications not helping, states that he feels worse and is unable to sleep.    HPI: SUBJECTIVE:  Same day acute visit; PCP not available.  Chart reviewed.   Kenneth Sellers is a 62 y.o. male who complains of congestion, nasal blockage, post nasal drip, cough described as harsh, productive and constant and denies sinus, high fevers, chest pain or significant GI symptoms. Symptoms have been present for 2 weeks. See last note. He was initially evaluated at onset of URI sxs by allergist with "steroid injection". Improved for a few days, but then went into asthma exacerbation. Treated here on 26th with pred taper (low dose) and amoxicillin and cough meds, however, not improving. Still with wheeze (using albuterol q 4) and coughing interfering with sleep. Denies pleuritic chest pain. He denies a history of anorexia, dizziness, vomiting  Patient does not smoke cigarettes.  Assessment  1. Moderate persistent asthmatic bronchitis with acute exacerbation      Plan  Discussion:  Had long discussion with patient and wife regarding further eval and treatment options. This is his third visit for same illness, not improving. Pt declined ER eval.   In office, lower than normal oxygenation but improved with neb. Elected to step up therapies: change to rocephin x 1 and zpak, pred burst 60 daily for 5 days, and get chest xray. Recheck by allergist or pcp early next week. If worsens any, then to ER for further treatment as we have maximized outpt options. Patient understands and agrees with care plan.   Follow up: No follow-ups on file. next week with allergy or pcp Orders Placed This Encounter  Procedures  . DG Chest 2 View   Meds ordered this encounter  Medications  . predniSONE (DELTASONE) 20 MG tablet    Sig: Take 3 tabs daily for 5 days    Dispense:  15  tablet    Refill:  0  . azithromycin (ZITHROMAX) 250 MG tablet    Sig: Take 2 tabs today, then 1 tab daily for 4 days    Dispense:  1 each    Refill:  0  . cefTRIAXone (ROCEPHIN) injection 1 g  . ipratropium-albuterol (DUONEB) 0.5-2.5 (3) MG/3ML nebulizer solution 3 mL      I reviewed the patients updated PMH, FH, and SocHx.  Social History: Patient  reports that he has never smoked. He has never used smokeless tobacco. He reports current alcohol use of about 2.0 standard drinks of alcohol per week. He reports that he does not use drugs.  Patient Active Problem List   Diagnosis Date Noted  . TSH elevation 08/10/2017  . Irritable bowel syndrome with diarrhea 08/10/2017  . Bilateral hearing loss 10/10/2015  . Routine general medical examination at a health care facility 09/23/2015  . White coat syndrome with diagnosis of hypertension 09/23/2015  . Coarse tremors 09/23/2015  . OBSTRUCTIVE SLEEP APNEA 11/07/2009  . ECZEMA, ATOPIC 05/15/2008  . Hyperlipidemia with target LDL less than 130 01/17/2007  . ALLERGIC RHINITIS 01/17/2007  . ASTHMA 01/17/2007    Review of Systems: Cardiovascular: negative for chest pain Respiratory: negative for SOB or hemoptysis Gastrointestinal: negative for abdominal pain Genitourinary: negative for dysuria or gross hematuria Current Meds  Medication Sig  . albuterol (VENTOLIN HFA) 108 (90 Base) MCG/ACT inhaler INHALE 1 TO 2 PUFFS INTO THE LUNGS EVERY 6  HOURS AS NEEDED FOR WHEEZING OR SHORTNESS OF BREATH  . aliskiren (TEKTURNA) 150 MG tablet Take 1 tablet (150 mg total) by mouth daily.  . AMBULATORY NON FORMULARY MEDICATION CPAP machine nightly  . amoxicillin (AMOXIL) 875 MG tablet Take 1 tablet (875 mg total) by mouth 2 (two) times daily.  Marland Kitchen ezetimibe-simvastatin (VYTORIN) 10-20 MG tablet TAKE 1 TABLET BY MOUTH AT BEDTIME.  . fluticasone (FLONASE) 50 MCG/ACT nasal spray   . fluticasone furoate-vilanterol (BREO ELLIPTA) 200-25 MCG/INH AEPB Inhale 1  puff into the lungs daily.  Marland Kitchen HYDROcodone-homatropine (HYCODAN) 5-1.5 MG/5ML syrup Take 5 mLs by mouth every 8 (eight) hours as needed for cough.  . levocetirizine (XYZAL) 5 MG tablet Take 5 mg by mouth daily as needed.   . montelukast (SINGULAIR) 10 MG tablet Take 10 mg by mouth at bedtime.    . moxifloxacin (VIGAMOX) 0.5 % ophthalmic solution   . neomycin-polymyxin b-dexamethasone (MAXITROL) 3.5-10000-0.1 OINT   . prednisoLONE acetate (PRED FORTE) 1 % ophthalmic suspension   . Probiotic Product (RESTORA) CAPS Take 1 capsule by mouth daily.  . [DISCONTINUED] predniSONE (DELTASONE) 10 MG tablet 3 tabs x3 days and then 2 tabs x3 days and then 1 tab x3 days.  Take w/ food.    Objective  Vitals: BP 124/84   Pulse (!) 106   Temp 98.8 F (37.1 C) (Oral)   Resp 20   Ht 5\' 10"  (1.778 m)   Wt 195 lb (88.5 kg)   SpO2 93%   BMI 27.98 kg/m  General: nontoxic, but wheezing and coughing. Speaking in full sentences Psych:  Alert and oriented, normal mood and affect HEENT:  Normocephalic, atraumatic, supple neck, moist mucous membranes, mildly erythematous pharynx without exudate, mild lymphadenopathy, supple neck Cardiovascular:  RRR without murmur. no edema Respiratory:  Mild tachypnea prior to neb; improved post neb; diffuse wheeze improved with duoneb. No retractions. No rales, occasional rhonchi Skin:  Warm, no rashes Neurologic:   Mental status is normal. normal gait  Commons side effects, risks, benefits, and alternatives for medications and treatment plan prescribed today were discussed, and the patient expressed understanding of the given instructions. Patient is instructed to call or message via MyChart if he/she has any questions or concerns regarding our treatment plan. No barriers to understanding were identified. We discussed Red Flag symptoms and signs in detail. Patient expressed understanding regarding what to do in case of urgent or emergency type symptoms.  Medication list was  reconciled, printed and provided to the patient in AVS. Patient instructions and summary information was reviewed with the patient as documented in the AVS. This note was prepared with assistance of Dragon voice recognition software. Occasional wrong-word or sound-a-like substitutions may have occurred due to the inherent limitations of voice recognition software

## 2018-02-06 NOTE — Patient Instructions (Signed)
Please follow up with Dr. Donneta Romberg or your PCP early next week.   Please go to our [facility] office to get your xrays done. You can walk in M-F between 8am and 5pm. Tell them you are there for xrays ordered by me. They will send me the results, then I will let you know the results with instructions.   Address: [place], [place], [place]  (office sits at [address] at [address] intersection; from here, turn left onto [place] [address]), take to [address], turn right and go for a mile or so, office will be on left across form [employer] )  Please take the zpak and the higher dose steroids starting today. Use your inhaler every 4 hours but no more frequently.  IF you are not improving or worsening, then report to the ER for further evaluation and care.   If you have any questions or concerns, please don't hesitate to send me a message via MyChart or call the office at [phone]. Thank you for visiting with [place] today! It's our pleasure caring for you.   Asthma Attack  Acute bronchospasm caused by asthma is also referred to as an asthma attack. Bronchospasm means that the air passages become narrowed or "tight," which limits the amount of oxygen that can get into the lungs. The narrowing is caused by inflammation and tightening of the muscles in the air tubes (bronchi) in the lungs. Excessive mucus is also produced, which narrows the airways more. This can cause trouble breathing, coughing, and loud breathing (wheezing). What are the causes? Possible triggers include:  Animal dander from the skin, hair, or feathers of animals.  Dust mites contained in house dust.  Cockroaches.  Pollen from trees or grass.  Mold.  Cigarette or tobacco smoke.  Air pollutants such as dust, household cleaners, hair sprays, aerosol sprays, paint fumes, strong chemicals, or strong odors.  Cold air or weather changes. Cold air may  trigger inflammation. Winds increase molds and pollens in the air.  Strong emotions such as crying or laughing hard.  Stress.  Certain medicines, such as aspirin or beta-blockers.  Sulfites in foods and drinks, such as dried fruits and wine.  Infections or inflammatory conditions, such as a flu, a cold, pneumonia, or inflammation of the nasal membranes (rhinitis).  Gastroesophageal reflux disease (GERD). GERD is a condition in which stomach acid backs up into your esophagus, which can irritate nearby airway structures.  Exercise or activity that requires a lot of energy. What are the signs or symptoms? Symptoms of this condition include:  Wheezing. This may sound like whistling while breathing. This may be more noticeable at night.  Excessive coughing, particularly at night.  Chest tightness or pain.  Shortness of breath.  Feeling like you cannot get enough air no matter how hard you try (air hunger). How is this diagnosed? This condition may be diagnosed based on:  Your medical history.  Your symptoms.  A physical exam.  Tests to check for other causes of your symptoms or other conditions that may have triggered your asthma attack. These tests may include: ? Chest X-ray. ? Blood tests. ? Specialized tests to assess lung function, such as breathing into a device that measures how much air you inhale and exhale (spirometry). How is this treated? The goal of treatment is to open the airways in your lungs and reduce inflammation. Most asthma attacks are treated with medicines that you inhale through  a hand-held inhaler (metered dose inhaler, MDI) or a device that turns liquid medicine into a mist that you inhale (nebulizer). Medicines may include:  Quick relief or rescue medicines that relax the muscles of the bronchi. These medicines include bronchodilators, such as albuterol.  Controller medicines, such as inhaled corticosteroids. These are long-acting medicines that are  used for daily asthma maintenance. If you have a moderate or severe asthma attack, you may be treated with steroid medicines by mouth or through an IV injection at the hospital. Steroid medicines reduce inflammation in your lungs. Depending on the severity of your attack, you may need oxygen therapy to help you breathe. If your asthma attack was caused by a bacterial infection, such as pneumonia, you will be given antibiotic medicines. Follow these instructions at home: Medicines  Take over-the-counter and prescription medicines only as told by your health care provider. Keep your medicines up-to-date and available.  If you are more than [redacted] weeks pregnant and you are prescribed any new medicines, tell your obstetrician about those medicines.  If you were prescribed an antibiotic medicine, take it as told by your health care provider. Do not stop taking the antibiotic even if you start to feel better. Avoiding triggers   Keep track of things that trigger your asthma attacks or cause you to have breathing problems, and avoid exposure to these triggers.  Do not use any products that contain nicotine or tobacco, such as cigarettes and e-cigarettes. If you need help quitting, ask your health care provider.  Avoid secondhand smoke.  Avoid strong smells, such as perfumes, aerosols, and cleaning solvents.  When pollen or air pollution is bad, keep windows closed and use an air conditioner or go to places with air conditioning. Asthma action plan  Work with your health care provider to make a written plan for managing and treating your asthma attacks (asthma action plan). This plan should include: ? A list of your asthma triggers and how to avoid them. ? Information about when your medicines should be taken and when their dosage should be changed. ? Instructions about using a device called a peak flow meter to monitor your condition. A peak flow meter measures how well your lungs are working and  measures how severe your asthma is at a given time. Your "personal best" is the highest peak flow rate you can reach when you feel good and have no asthma symptoms. General instructions  Avoid excessive exercise or activity until your asthma attack resolves. Ask your health care provider what activities are safe for you and when you can return to your normal activities.  Stay up to date on all vaccinations recommended by your health care provider, such as flu and pneumonia vaccines.  Drink enough fluid to keep your urine clear or pale yellow. Staying hydrated helps keep mucus in your lungs thin so it can be coughed up easily.  If you drink caffeine, do so in moderation.  Do not use alcohol until you have recovered.  Keep all follow-up visits as told by your health care provider. This is important. Asthma requires careful medical care, and you and your health care provider can work together to reduce the likelihood of future attacks. Contact a health care provider if:  Your peak flow reading is still at 50-79% of your personal best after you have followed your action plan for 1 hour. This is in the yellow zone, which means "caution."  You need to use a reliever medicine more than 2-3  times a week.  Your medicines are causing side effects, such as: ? Rash. ? Itching. ? Swelling. ? Trouble breathing.  Your symptoms do not improve after 48 hours.  You cough up mucus (sputum) that is thicker than usual.  You have a fever.  You need to use your medicines much more frequently than normal. Get help right away if:  Your peak flow reading is less than 50% of your personal best. This is in the red zone, which means "danger."  You have severe trouble breathing.  You develop chest pain or discomfort.  Your medicines no longer seem to be helping.  You vomit.  You cannot eat or drink without vomiting.  You are coughing up yellow, green, brown, or bloody mucus.  You have a fever and  your symptoms suddenly get worse.  You have trouble swallowing.  You feel very tired, and breathing becomes tiring. Summary  Acute bronchospasm caused by asthma is also referred to as an asthma attack.  Bronchospasm is caused by narrowing or tightness in air passages, which causes shortness of breath, coughing, and loud breathing (wheezing).  Many things can trigger an asthma attack, such as allergens, weather changes, exercise, smoke, and other fumes.  Treatment for an asthma attack may include inhaled rescue medicines for immediate relief, as well as the use of maintenance therapy.  Get help right away if you have worsening shortness of breath, chest pain, or fever, or if your home medicines are no longer helping with your symptoms. This information is not intended to replace advice given to you by your health care provider. Make sure you discuss any questions you have with your health care provider. Document Released: 05/12/2006 Document Revised: 02/27/2016 Document Reviewed: 02/27/2016 Elsevier Interactive Patient Education  2019 [employer].

## 2018-02-08 DIAGNOSIS — Z87442 Personal history of urinary calculi: Secondary | ICD-10-CM

## 2018-02-08 HISTORY — PX: EYE SURGERY: SHX253

## 2018-02-08 HISTORY — DX: Personal history of urinary calculi: Z87.442

## 2018-02-09 DIAGNOSIS — J301 Allergic rhinitis due to pollen: Secondary | ICD-10-CM | POA: Diagnosis not present

## 2018-02-09 DIAGNOSIS — J4541 Moderate persistent asthma with (acute) exacerbation: Secondary | ICD-10-CM | POA: Diagnosis not present

## 2018-02-09 DIAGNOSIS — J3089 Other allergic rhinitis: Secondary | ICD-10-CM | POA: Diagnosis not present

## 2018-02-09 DIAGNOSIS — L309 Dermatitis, unspecified: Secondary | ICD-10-CM | POA: Diagnosis not present

## 2018-02-09 MED FILL — RESTORA CAPSULE: 90 days supply | Qty: 90 | Fill #1

## 2018-02-09 MED FILL — ALISKIREN FUMARATE 150 MG T: 150 | 90 days supply | Qty: 90 | Fill #1

## 2018-02-09 MED FILL — predniSONE 10 MG TABS: 10 | 4 days supply | Qty: 10 | Fill #0

## 2018-02-09 MED FILL — LEVOCETIRIZINE 5 MG TABLET: 5 | 90 days supply | Qty: 90 | Fill #0

## 2018-02-16 ENCOUNTER — Other Ambulatory Visit: Payer: Self-pay

## 2018-02-16 ENCOUNTER — Ambulatory Visit (INDEPENDENT_AMBULATORY_CARE_PROVIDER_SITE_OTHER): Payer: BC Managed Care – PPO | Admitting: Family Medicine

## 2018-02-16 ENCOUNTER — Encounter: Payer: Self-pay | Admitting: Family Medicine

## 2018-02-16 VITALS — BP 128/84 | HR 113 | Temp 99.0°F | Resp 20 | Ht 70.0 in | Wt 195.8 lb

## 2018-02-16 DIAGNOSIS — B3781 Candidal esophagitis: Secondary | ICD-10-CM

## 2018-02-16 DIAGNOSIS — J4541 Moderate persistent asthma with (acute) exacerbation: Secondary | ICD-10-CM

## 2018-02-16 DIAGNOSIS — B370 Candidal stomatitis: Secondary | ICD-10-CM | POA: Diagnosis not present

## 2018-02-16 MED ORDER — HYDROCODONE-HOMATROPINE 5-1.5 MG/5ML PO SYRP: 5.0000 mL | ORAL_SOLUTION | Freq: Three times a day (TID) | ORAL | 0 refills | Status: DC | PRN

## 2018-02-16 MED ORDER — FLUCONAZOLE 150 MG PO TABS: ORAL_TABLET | ORAL | 0 refills | Status: DC

## 2018-02-16 MED ORDER — NYSTATIN 100000 UNIT/ML MT SUSP: 10.0000 mL | Freq: Four times a day (QID) | OROMUCOSAL | 0 refills | Status: DC

## 2018-02-16 MED FILL — NYSTATIN 100,000 UNITS/ML S: 100000 | 6 days supply | Qty: 250 | Fill #0

## 2018-02-16 MED FILL — MONTELUKAST SOD 10 MG TAB: 10 | 90 days supply | Qty: 90 | Fill #0

## 2018-02-16 MED FILL — HYDROCODONE-HOMATROPINE SYR: 5-1.5 | 8 days supply | Qty: 120 | Fill #0

## 2018-02-16 MED FILL — FLUCONAZOLE 150 MG TABS: 150 | 4 days supply | Qty: 2 | Fill #0

## 2018-02-16 NOTE — Patient Instructions (Signed)
Follow up with Dr. Donneta Romberg or Dr. Ronnald Ramp as needed.   Oral Thrush, Adult  Oral thrush, also called oral candidiasis, is a fungal infection that develops in the mouth and throat and on the tongue. It causes white patches to form on the mouth and tongue. Kenneth Sellers is most common in older adults, but it can occur at any age. Many cases of thrush are mild, but this infection can also be serious. Kenneth Sellers can be a repeated (recurrent) problem for certain people who have a weak body defense system (immune system). The weakness can be caused by chronic illnesses, or by taking medicines that limit the body's ability to fight infection. If a person has difficulty fighting infection, the fungus that causes thrush can spread through the body. This can cause life-threatening blood or organ infections. What are the causes? This condition is caused by a fungus (yeast) called Candida albicans.  This fungus is normally present in small amounts in the mouth and on other mucous membranes. It usually causes no harm.  If conditions are present that allow the fungus to grow without control, it invades surrounding tissues and becomes an infection.  Other Candida species can also lead to thrush (rare). What increases the risk? This condition is more likely to develop in:  People with a weakened immune system.  Older adults.  People with HIV (human immunodeficiency virus).  People with diabetes.  People with dry mouth (xerostomia).  Pregnant women.  People with poor dental care, especially people who have false teeth.  People who use antibiotic medicines. What are the signs or symptoms? Symptoms of this condition can vary from mild and moderate to severe and persistent. Symptoms may include:  A burning feeling in the mouth and throat. This can occur at the start of a thrush infection.  White patches that stick to the mouth and tongue. The tissue around the patches may be red, raw, and painful. If rubbed  (during tooth brushing, for example), the patches and the tissue of the mouth may bleed easily.  A bad taste in the mouth or difficulty tasting foods.  A cottony feeling in the mouth.  Pain during eating and swallowing.  Poor appetite.  Cracking at the corners of the mouth. How is this diagnosed? This condition is diagnosed based on:  Physical exam. Your health care provider will look in your mouth.  Health history. Your health care provider will ask you questions about your health. How is this treated? This condition is treated with medicines called antifungals, which prevent the growth of fungi. These medicines are either applied directly to the affected area (topical) or swallowed (oral). The treatment will depend on the severity of the condition. Mild thrush Mild cases of thrush may clear up with the use of an antifungal mouth rinse or lozenges. Treatment usually lasts about 14 days. Moderate to severe thrush  More severe thrush infections that have spread to the esophagus are treated with an oral antifungal medicine. A topical antifungal medicine may also be used.  For some severe infections, treatment may need to continue for more than 14 days.  Oral antifungal medicines are rarely used during pregnancy because they may be harmful to the unborn child. If you are pregnant, talk with your health care provider about options for treatment. Persistent or recurrent thrush For cases of thrush that do not go away or keep coming back:  Treatment may be needed twice as long as the symptoms last.  Treatment will include both oral and  topical antifungal medicines.  People with a weakened immune system can take an antifungal medicine on a continuous basis to prevent thrush infections. It is important to treat conditions that make a person more likely to get thrush, such as diabetes or HIV. Follow these instructions at home: Medicines  Take over-the-counter and prescription medicines  only as told by your health care provider.  Talk with your health care provider about an over-the-counter medicine called gentian violet, which kills bacteria and fungi. Relieving soreness and discomfort To help reduce the discomfort of thrush:  Drink cold liquids such as water or iced tea.  Try flavored ice treats or frozen juices.  Eat foods that are easy to swallow, such as gelatin, ice cream, or custard.  Try drinking from a straw if the patches in your mouth are painful.  General instructions  Eat plain, unflavored yogurt as directed by your health care provider. Check the label to make sure the yogurt contains live cultures. This yogurt can help healthy bacteria to grow in the mouth and can stop the growth of the fungus that causes thrush.  If you wear dentures, remove the dentures before going to bed, brush them vigorously, and soak them in a cleaning solution as directed by your health care provider.  Rinse your mouth with a warm salt-water mixture several times a day. To make a salt-water mixture, completely dissolve 1/2-1 tsp of salt in 1 cup of warm water. Contact a health care provider if:  Your symptoms are getting worse or are not improving within 7 days of starting treatment.  You have symptoms of a spreading infection, such as white patches on the skin outside of the mouth. This information is not intended to replace advice given to you by your health care provider. Make sure you discuss any questions you have with your health care provider. Document Released: 10/21/2003 Document Revised: 10/20/2015 Document Reviewed: 10/20/2015 Elsevier Interactive Patient Education  [employer].

## 2018-02-16 NOTE — Progress Notes (Signed)
Subjective  CC:  Chief Complaint  Patient presents with  . Follow-up    Bronchitis, he now states that he has white painful spots in throat. He noticed spots Monday and has used salt water    HPI: Kenneth Sellers is a 63 y.o. male who presents to the office today to address the problems listed above in the chief complaint.  See last two visits: since he was here, he has improved. He saw Dr. Donneta Romberg who extended his steroids. He completed that today. Completed his antibiotics and is feeling better although breathing remains tight. using inhalers. Requests more cough meds. No f/u scheduled.   Now with white spots on pharynx that are sore ? Thrush due to abx and steroids.   ROS: no more fevers or sob.   Assessment  1. Thrush of mouth and esophagus ([place])   2. Moderate persistent asthmatic bronchitis with acute exacerbation      Plan   thrush:  Oral nystatin and diflucan. See avs  Ashtma:  Much improved but still active. rec f/u with asthma specialist.   Follow up: prn    No orders of the defined types were placed in this encounter.  Meds ordered this encounter  Medications  . nystatin (MYCOSTATIN) 100000 UNIT/ML suspension    Sig: Take 10 mLs (1,000,000 Units total) by mouth 4 (four) times daily. Swish, gargle and swish    Dispense:  250 mL    Refill:  0  . HYDROcodone-homatropine (HYCODAN) 5-1.5 MG/5ML syrup    Sig: Take 5 mLs by mouth every 8 (eight) hours as needed for cough.    Dispense:  120 mL    Refill:  0  . fluconazole (DIFLUCAN) 150 MG tablet    Sig: Take one tablet today; may repeat in 3 days if symptoms persist    Dispense:  2 tablet    Refill:  0      I reviewed the patients updated PMH, FH, and SocHx.    Patient Active Problem List   Diagnosis Date Noted  . TSH elevation 08/10/2017  . Irritable bowel syndrome with diarrhea 08/10/2017  . Bilateral hearing loss 10/10/2015  . Routine general medical examination at a health care facility 09/23/2015  .  White coat syndrome with diagnosis of hypertension 09/23/2015  . Coarse tremors 09/23/2015  . OBSTRUCTIVE SLEEP APNEA 11/07/2009  . ECZEMA, ATOPIC 05/15/2008  . Hyperlipidemia with target LDL less than 130 01/17/2007  . ALLERGIC RHINITIS 01/17/2007  . ASTHMA 01/17/2007   Current Meds  Medication Sig  . albuterol (VENTOLIN HFA) 108 (90 Base) MCG/ACT inhaler INHALE 1 TO 2 PUFFS INTO THE LUNGS EVERY 6 HOURS AS NEEDED FOR WHEEZING OR SHORTNESS OF BREATH  . aliskiren (TEKTURNA) 150 MG tablet Take 1 tablet (150 mg total) by mouth daily.  . AMBULATORY NON FORMULARY MEDICATION CPAP machine nightly  . ezetimibe-simvastatin (VYTORIN) 10-20 MG tablet TAKE 1 TABLET BY MOUTH AT BEDTIME.  . fluticasone (FLONASE) 50 MCG/ACT nasal spray   . fluticasone furoate-vilanterol (BREO ELLIPTA) 200-25 MCG/INH AEPB Inhale 1 puff into the lungs daily.  Marland Kitchen HYDROcodone-homatropine (HYCODAN) 5-1.5 MG/5ML syrup Take 5 mLs by mouth every 8 (eight) hours as needed for cough.  . levocetirizine (XYZAL) 5 MG tablet Take 5 mg by mouth daily as needed.   . montelukast (SINGULAIR) 10 MG tablet Take 10 mg by mouth at bedtime.    . moxifloxacin (VIGAMOX) 0.5 % ophthalmic solution   . neomycin-polymyxin b-dexamethasone (MAXITROL) 3.5-10000-0.1 OINT   . prednisoLONE  acetate (PRED FORTE) 1 % ophthalmic suspension   . Probiotic Product (RESTORA) CAPS Take 1 capsule by mouth daily.  . [DISCONTINUED] HYDROcodone-homatropine (HYCODAN) 5-1.5 MG/5ML syrup Take 5 mLs by mouth every 8 (eight) hours as needed for cough.    Allergies: Patient has No Known Allergies. Family History: Patient family history includes Allergies in his sister; Arthritis in his sister; COPD in his brother; Diabetes in his brother, mother, and sister; Heart attack (age of onset: 50) in his brother; Heart attack (age of onset: 57) in his father; Heart disease in his brother; Heart failure in his mother; Hypertension in his brother and mother; Stroke (age of onset:  7) in his mother. Social History:  Patient  reports that he has never smoked. He has never used smokeless tobacco. He reports current alcohol use of about 2.0 standard drinks of alcohol per week. He reports that he does not use drugs.  Review of Systems: Constitutional: Negative for fever malaise or anorexia Cardiovascular: negative for chest pain Respiratory: negative for SOB + persistent cough Gastrointestinal: negative for abdominal pain  Objective  Vitals: BP 128/84   Pulse (!) 113   Temp 99 F (37.2 C) (Oral)   Resp 20   Ht 5\' 10"  (1.778 m)   Wt 195 lb 12.8 oz (88.8 kg)   SpO2 95%   BMI 28.09 kg/m  General: no acute distress , A&Ox3, looks better, breathing normally today HEENT: Oropharynx moist with white patches in posterior pharyanx with mild erythema. No ulcers Cardiovascular:  RRR without murmur or gallop.  Respiratory:  Fair breath sounds bilaterally,wheezing present bilaterally with fair air mvt Skin:  Warm, no rashes     Commons side effects, risks, benefits, and alternatives for medications and treatment plan prescribed today were discussed, and the patient expressed understanding of the given instructions. Patient is instructed to call or message via MyChart if he/she has any questions or concerns regarding our treatment plan. No barriers to understanding were identified. We discussed Red Flag symptoms and signs in detail. Patient expressed understanding regarding what to do in case of urgent or emergency type symptoms.   Medication list was reconciled, printed and provided to the patient in AVS. Patient instructions and summary information was reviewed with the patient as documented in the AVS. This note was prepared with assistance of Dragon voice recognition software. Occasional wrong-word or sound-a-like substitutions may have occurred due to the inherent limitations of voice recognition software

## 2018-02-23 ENCOUNTER — Ambulatory Visit (INDEPENDENT_AMBULATORY_CARE_PROVIDER_SITE_OTHER): Payer: BC Managed Care – PPO | Admitting: Internal Medicine

## 2018-02-23 ENCOUNTER — Encounter: Payer: Self-pay | Admitting: Internal Medicine

## 2018-02-23 VITALS — BP 160/100 | HR 89 | Temp 98.4°F | Resp 20 | Ht 70.0 in | Wt 196.0 lb

## 2018-02-23 DIAGNOSIS — R0789 Other chest pain: Secondary | ICD-10-CM | POA: Diagnosis not present

## 2018-02-23 DIAGNOSIS — J3089 Other allergic rhinitis: Secondary | ICD-10-CM

## 2018-02-23 DIAGNOSIS — I10 Essential (primary) hypertension: Secondary | ICD-10-CM

## 2018-02-23 DIAGNOSIS — J4521 Mild intermittent asthma with (acute) exacerbation: Secondary | ICD-10-CM | POA: Diagnosis not present

## 2018-02-23 NOTE — Progress Notes (Signed)
Subjective:  Patient ID: Kenneth Sellers, male    DOB: [birth year]  Age: 63 y.o. MRN: [id number]  CC: Asthma   HPI Kenneth Sellers presents for f/up - He has been sick for about a month with respiratory symptoms.  He has been seen twice by primary care and once or twice by an allergist.  He has been treated with a couple of courses of steroids and a couple of courses of antibiotics.  He tells me he is finally starting to feel better.  He tells me he is compliant with Breo inhaler, the steroid nasal spray, Xyzal, and Singulair.  He wants to transfer his asthma care from an allergist to a pulmonologist.  His only remaining symptoms are runny nose, sneezing, wheezing, and a sensation of pressure in his chest.  He tells me his blood pressure at home is been in the 120-130/75-80 range.  Outpatient Medications Prior to Visit  Medication Sig Dispense Refill  . albuterol (VENTOLIN HFA) 108 (90 Base) MCG/ACT inhaler INHALE 1 TO 2 PUFFS INTO THE LUNGS EVERY 6 HOURS AS NEEDED FOR WHEEZING OR SHORTNESS OF BREATH 18 g 0  . aliskiren (TEKTURNA) 150 MG tablet Take 1 tablet (150 mg total) by mouth daily. 90 tablet 1  . AMBULATORY NON FORMULARY MEDICATION CPAP machine nightly    . ezetimibe-simvastatin (VYTORIN) 10-20 MG tablet TAKE 1 TABLET BY MOUTH AT BEDTIME. 90 tablet 1  . fluticasone furoate-vilanterol (BREO ELLIPTA) 200-25 MCG/INH AEPB Inhale 1 puff into the lungs daily.    Marland Kitchen nystatin (MYCOSTATIN) 100000 UNIT/ML suspension Take 10 mLs (1,000,000 Units total) by mouth 4 (four) times daily. Swish, gargle and swish 250 mL 0  . Probiotic Product (RESTORA) CAPS Take 1 capsule by mouth daily. 90 capsule 1  . fluticasone (FLONASE) 50 MCG/ACT nasal spray   3  . levocetirizine (XYZAL) 5 MG tablet Take 5 mg by mouth daily as needed.     . montelukast (SINGULAIR) 10 MG tablet Take 10 mg by mouth at bedtime.      . fluconazole (DIFLUCAN) 150 MG tablet Take one tablet today; may repeat in 3 days if symptoms persist 2 tablet  0  . HYDROcodone-homatropine (HYCODAN) 5-1.5 MG/5ML syrup Take 5 mLs by mouth every 8 (eight) hours as needed for cough. 120 mL 0  . moxifloxacin (VIGAMOX) 0.5 % ophthalmic solution   1  . neomycin-polymyxin b-dexamethasone (MAXITROL) 3.5-10000-0.1 OINT   0  . prednisoLONE acetate (PRED FORTE) 1 % ophthalmic suspension   1   No facility-administered medications prior to visit.     ROS Review of Systems  Constitutional: Negative.  Negative for chills, fatigue and fever.  HENT: Positive for postnasal drip and rhinorrhea. Negative for congestion, sinus pain, sore throat and voice change.   Eyes: Negative.   Respiratory: Positive for wheezing. Negative for cough, chest tightness and shortness of breath.   Cardiovascular: Positive for chest pain. Negative for palpitations and leg swelling.  Gastrointestinal: Negative for abdominal pain, constipation, diarrhea, nausea and vomiting.  Endocrine: Negative.   Genitourinary: Negative.  Negative for difficulty urinating.  Musculoskeletal: Negative.  Negative for arthralgias and myalgias.  Skin: Negative.  Negative for color change and rash.    Objective:  BP (!) 160/100 (BP Location: Left Arm, Patient Position: Sitting, Cuff Size: Large)   Pulse 89   Temp 98.4 F (36.9 C) (Oral)   Resp 20   Ht 5\' 10"  (1.778 m)   Wt 196 lb (88.9 kg)   SpO2 97%  BMI 28.12 kg/m   BP Readings from Last 3 Encounters:  02/23/18 (!) 160/100  02/16/18 128/84  02/06/18 124/84    Wt Readings from Last 3 Encounters:  02/23/18 196 lb (88.9 kg)  02/16/18 195 lb 12.8 oz (88.8 kg)  02/06/18 195 lb (88.5 kg)    Physical Exam Vitals signs reviewed.  Constitutional:      General: He is not in acute distress.    Appearance: He is not ill-appearing, toxic-appearing or diaphoretic.  HENT:     Nose: Nose normal. No congestion or rhinorrhea.     Mouth/Throat:     Mouth: Mucous membranes are moist.     Pharynx: Oropharynx is clear. No oropharyngeal exudate.    Eyes:     General: No scleral icterus.    Conjunctiva/sclera: Conjunctivae normal.  Neck:     Musculoskeletal: Normal range of motion and neck supple.  Cardiovascular:     Rate and Rhythm: Normal rate and regular rhythm.     Heart sounds: No murmur. No friction rub. No gallop.      Comments: EKG ---  Sinus  Rhythm  WITHIN NORMAL LIMITS- no change from the prior EKGs Pulmonary:     Effort: Pulmonary effort is normal. No respiratory distress.     Breath sounds: No stridor. No wheezing, rhonchi or rales.  Abdominal:     General: Bowel sounds are normal.     Palpations: There is no hepatomegaly, splenomegaly or mass.     Tenderness: There is no abdominal tenderness.     Hernia: No hernia is present.  Musculoskeletal: Normal range of motion.        General: No swelling.     Right lower leg: No edema.     Left lower leg: No edema.  Lymphadenopathy:     Cervical: No cervical adenopathy.  Skin:    General: Skin is warm and dry.     Findings: No erythema or rash.  Neurological:     General: No focal deficit present.     Mental Status: He is oriented to person, place, and time. Mental status is at baseline.  Psychiatric:        Attention and Perception: Attention normal.        Mood and Affect: Mood is anxious. Mood is not depressed.        Speech: Speech normal.        Behavior: Behavior normal. Behavior is cooperative.        Thought Content: Thought content normal.     Lab Results  Component Value Date   WBC 5.7 11/16/2017   HGB 16.2 11/16/2017   HCT 48.0 11/16/2017   PLT 189.0 11/16/2017   GLUCOSE 107 (H) 11/16/2017   CHOL 148 11/16/2017   TRIG 84.0 11/16/2017   HDL 50.90 11/16/2017   LDLCALC 80 11/16/2017   ALT 23 11/16/2017   AST 20 11/16/2017   NA 140 11/16/2017   K 3.7 11/16/2017   CL 101 11/16/2017   CREATININE 1.01 11/16/2017   BUN 15 11/16/2017   CO2 31 11/16/2017   TSH 4.44 11/16/2017   PSA 0.31 11/16/2017   HGBA1C 5.8 09/18/2014    [name]  Contrast  Result Date: 03/31/2017 CLINICAL DATA:  63 y/o M; 3 weeks of severe headache with blurry vision in the right eye. EXAM: MRI HEAD WITHOUT CONTRAST TECHNIQUE: Multiplanar, multiecho pulse sequences of the brain and surrounding structures were obtained without intravenous contrast. COMPARISON:  None. FINDINGS: Brain: No acute infarction,  hemorrhage, hydrocephalus, extra-axial collection or mass lesion. Vascular: Normal flow voids. Skull and upper cervical spine: Normal marrow signal. Sinuses/Orbits: Negative. Other: None. IMPRESSION: No acute intracranial abnormality. Unremarkable MRI of the brain for age. Electronically Signed   By: Kristine Garbe M.D.   On: 03/31/2017 20:38    Assessment & Plan:   Kenneth Sellers was seen today for asthma.  Diagnoses and all orders for this visit:  Mild intermittent asthma with acute exacerbation -     Ambulatory referral to Pulmonology -     montelukast (SINGULAIR) 10 MG tablet; Take 1 tablet (10 mg total) by mouth at bedtime.  Chest pressure- His EKG is negative for ischemia today.  He had a normal ETT about a year and a half ago.  I do not think his chest pain is related to cardiovascular disease but is a symptom of his asthma. -     EKG 12-Lead  Seasonal allergic rhinitis due to other allergic trigger -     fluticasone (FLONASE) 50 MCG/ACT nasal spray; Place 2 sprays into both nostrils daily. -     levocetirizine (XYZAL) 5 MG tablet; Take 1 tablet (5 mg total) by mouth daily as needed. -     montelukast (SINGULAIR) 10 MG tablet; Take 1 tablet (10 mg total) by mouth at bedtime.  White coat syndrome with diagnosis of hypertension- His blood pressure is not well controlled here but he tells me at home it is well controlled.  Will continue Tekturna at the current dose.  I think there is a large component of whitecoat phenomenon.   I have discontinued Librado L. Pantaleo's prednisoLONE acetate, moxifloxacin, neomycin-polymyxin b-dexamethasone,  HYDROcodone-homatropine, and fluconazole. I have also changed his fluticasone, levocetirizine, and montelukast. Additionally, I am having him maintain his AMBULATORY NON FORMULARY MEDICATION, fluticasone furoate-vilanterol, ezetimibe-simvastatin, aliskiren, RESTORA, albuterol, and nystatin.  Meds ordered this encounter  Medications  . fluticasone (FLONASE) 50 MCG/ACT nasal spray    Sig: Place 2 sprays into both nostrils daily.    Dispense:  48 g    Refill:  1  . levocetirizine (XYZAL) 5 MG tablet    Sig: Take 1 tablet (5 mg total) by mouth daily as needed.    Dispense:  90 tablet    Refill:  1  . montelukast (SINGULAIR) 10 MG tablet    Sig: Take 1 tablet (10 mg total) by mouth at bedtime.    Dispense:  90 tablet    Refill:  1     Follow-up: Return in about 6 weeks (around 04/06/2018).  Scarlette Calico, MD

## 2018-02-23 NOTE — Patient Instructions (Signed)

## 2018-02-25 MED ORDER — FLUTICASONE PROPIONATE 50 MCG/ACT NA SUSP: 2.0000 | Freq: Every day | NASAL | 1 refills | Status: DC

## 2018-02-25 MED ORDER — LEVOCETIRIZINE DIHYDROCHLORIDE 5 MG PO TABS: 5.0000 mg | ORAL_TABLET | Freq: Every day | ORAL | 1 refills | Status: DC | PRN

## 2018-02-25 MED ORDER — MONTELUKAST SODIUM 10 MG PO TABS: 10.0000 mg | ORAL_TABLET | Freq: Every day | ORAL | 1 refills | Status: DC

## 2018-03-04 IMAGING — CT CT ABD-PELV W/ CM
2 of 6 series · 16 of 46 positions shown, 18 images · IV contrast (iopamidol)
Comparison: None.

CLINICAL DATA: Progressive right-sided abdominal pain

EXAM:
CT ABDOMEN AND PELVIS WITH CONTRAST
TECHNIQUE: Multidetector CT imaging of the abdomen and pelvis was performed
using the standard protocol following bolus administration of
intravenous contrast. Oral contrast was also administered.
CONTRAST:  100mL [id number] IOPAMIDOL ([id number]) INJECTION 61%

[Series 2: rtn a/p with · axial · 0.78mm/px · z∈[-518,-152]mm · 13 of 87 slices shown, 15 images]
[im 7/87  soft-tissue]
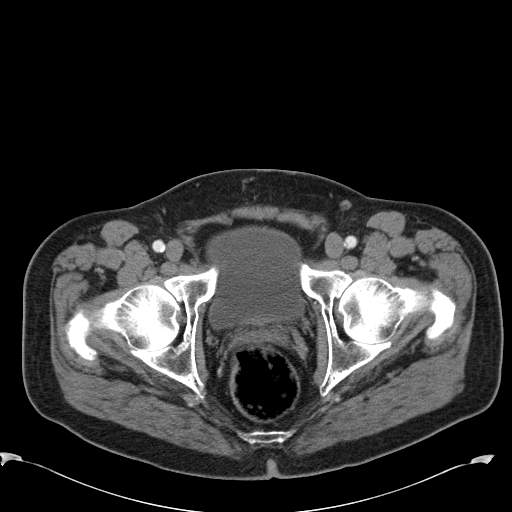
[im 7/87  bone]
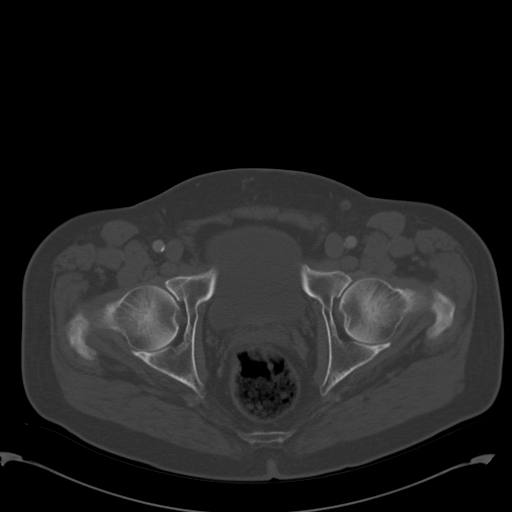
[im 13/87  soft-tissue]
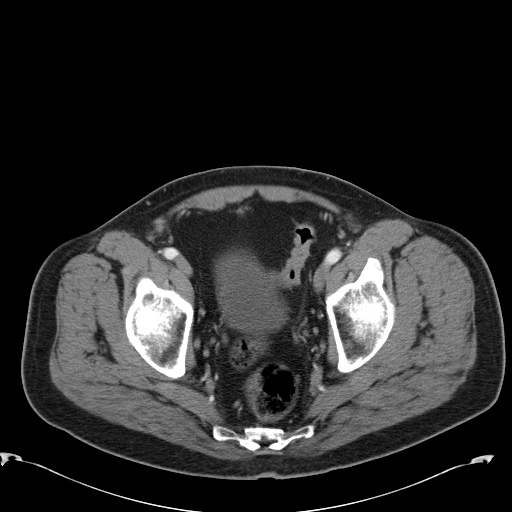
[im 19/87  soft-tissue]
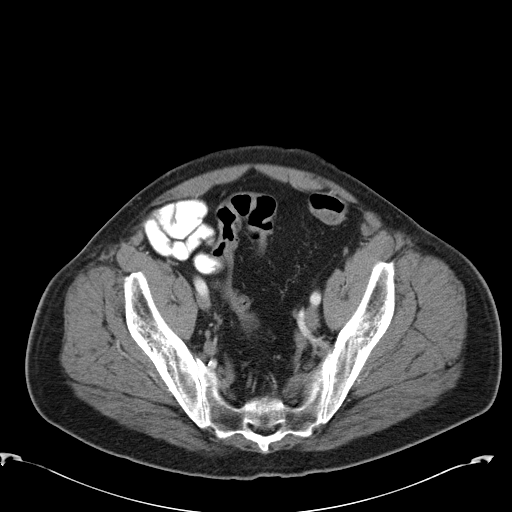
[im 25/87  soft-tissue]
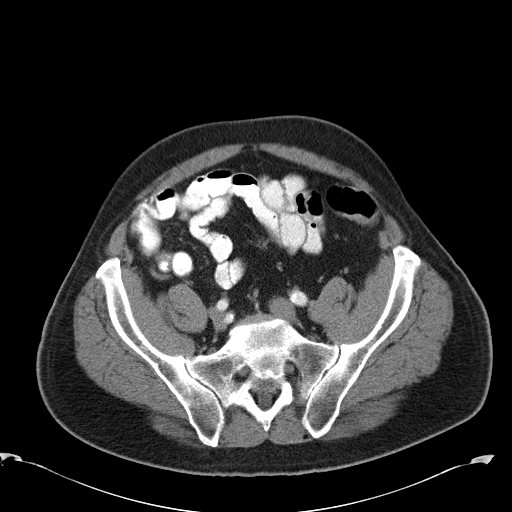
[im 31/87  soft-tissue]
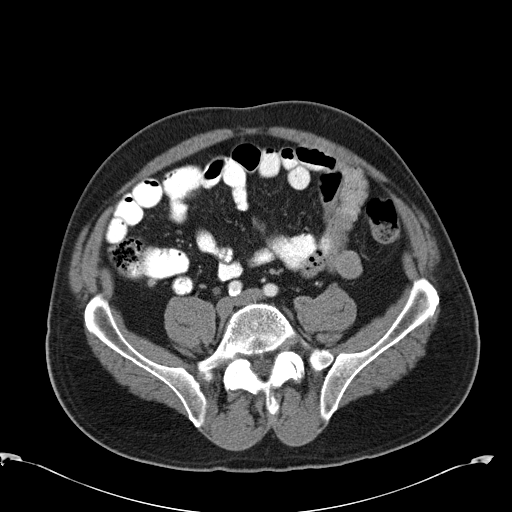
[im 37/87  soft-tissue]
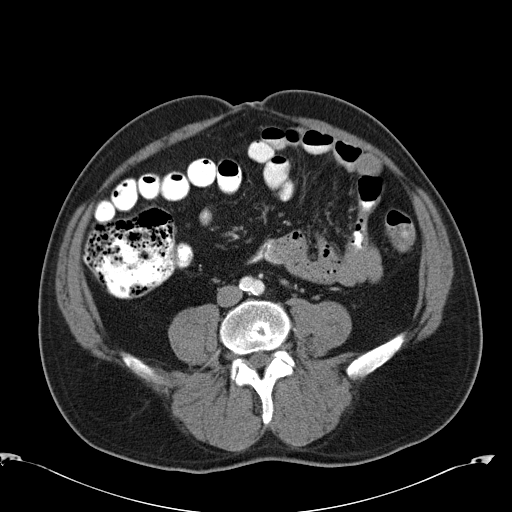
[im 44/87  soft-tissue]
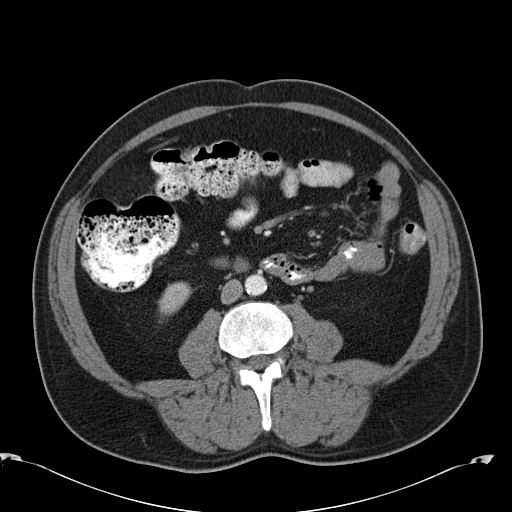
[im 50/87  soft-tissue]
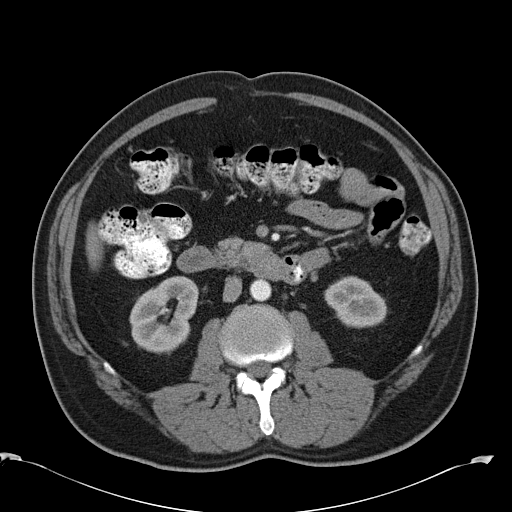
[im 56/87  soft-tissue]
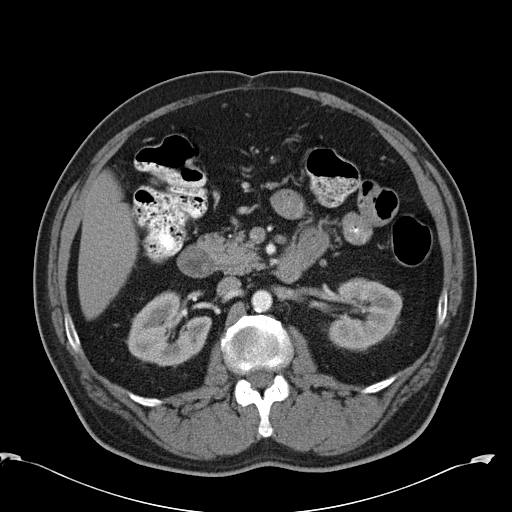
[im 56/87  bone]
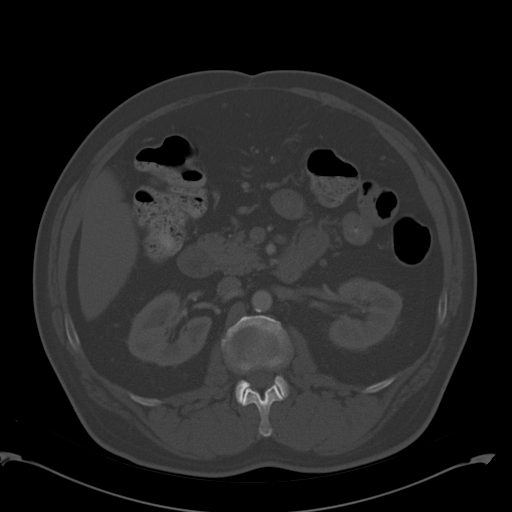
[im 62/87  soft-tissue]
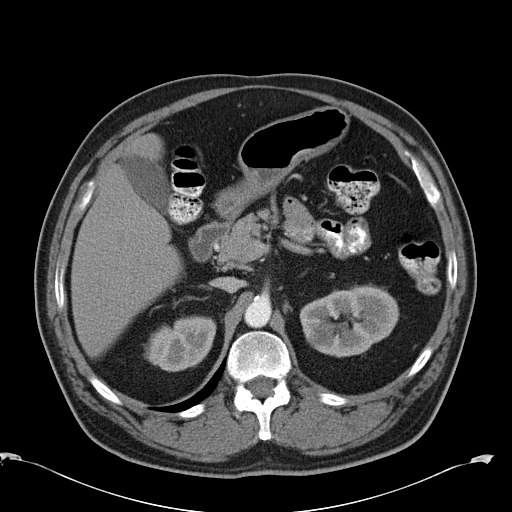
[im 68/87  soft-tissue]
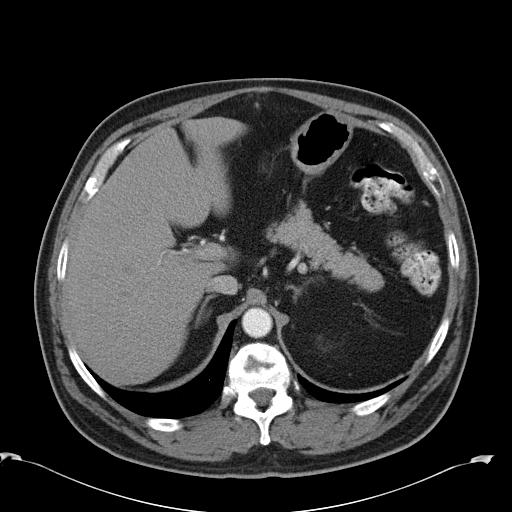
[im 74/87  soft-tissue]
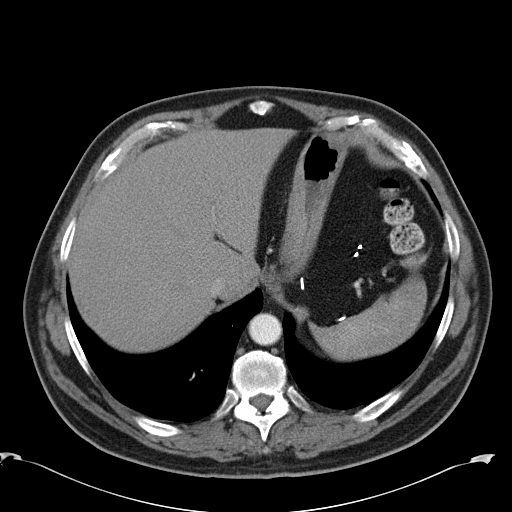
[im 80/87  soft-tissue]
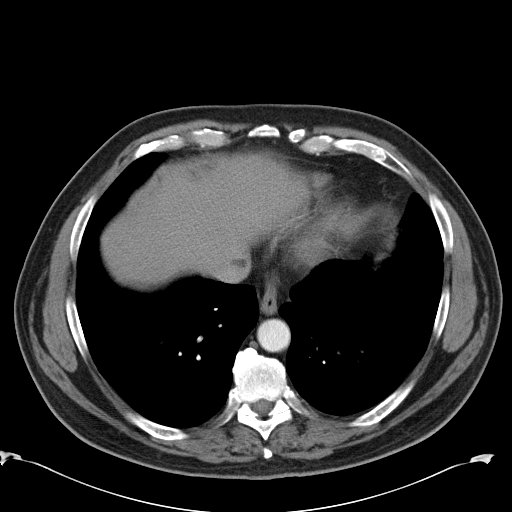

[Series 602: cor · coronal · 0.85mm/px · 3 of 153 slices shown]
[im 51/153  soft-tissue]
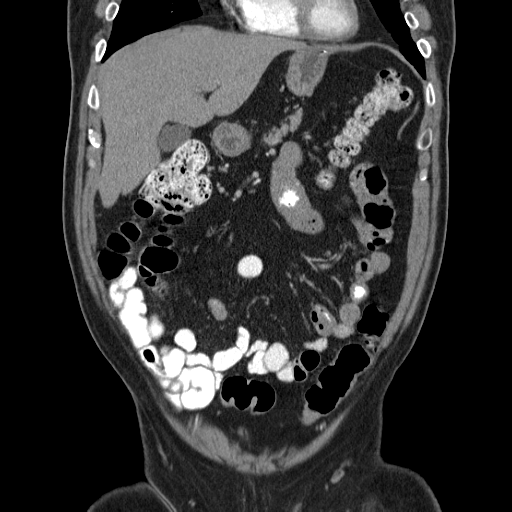
[im 68/153  soft-tissue]
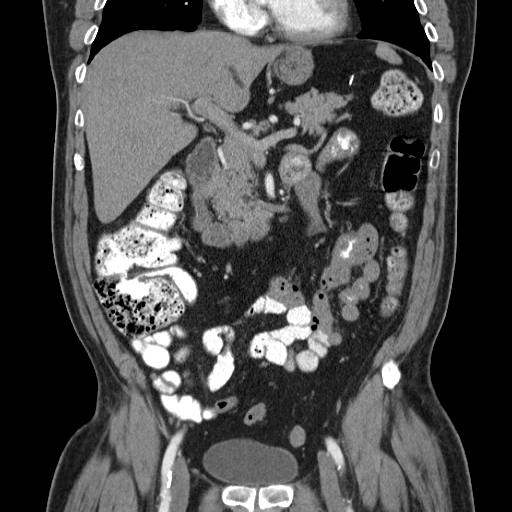
[im 85/153  soft-tissue]
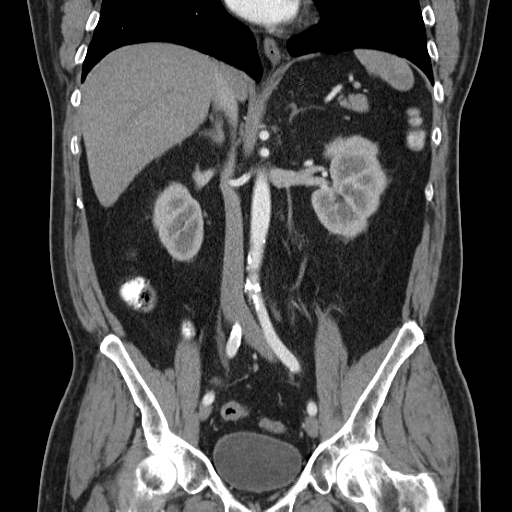

[16 of 46 positions shown; findings below may reference images not displayed]

FINDINGS: Lower chest: Lung bases are clear.

Hepatobiliary: No focal liver lesions are appreciable. Gallbladder
wall is not appreciably thickened. There is no biliary duct
dilatation.

Pancreas: No pancreatic mass or inflammatory focus.

Spleen: No splenic lesions are evident.

Adrenals/Urinary Tract: Adrenals appear normal bilaterally. There
are small cysts in left kidney. There is a somewhat larger cyst
arising from the medial upper pole of the left kidney measuring
x 1.7 cm. There is no hydronephrosis on either side. There is no
renal or ureteral calculus on either side. Urinary bladder is
midline with wall thickness within normal limits.

Stomach/Bowel: Patient has had a previous Nissen fundoplication with
surgical clips in the upper abdomen near the stomach. There is no
wall thickening or mesenteric thickening in this area. No abnormal
fluid collections. There are scattered sigmoid diverticula without
diverticulitis. Rectum is borderline distended with stool without
wall thickening in this area. There is no bowel wall or mesenteric
thickening. No bowel obstruction. No free air or portal venous air.

Vascular/Lymphatic: There are scattered foci of calcification in the
abdominal aorta and proximal common iliac arteries. There is no
abdominal aortic aneurysm. Major mesenteric vessels appear patent.
There is no adenopathy in the abdomen or pelvis.

Reproductive: There are a few tiny prostatic calculi. Prostate and
seminal vesicles appear normal in size and contour. There is no
pelvic mass or pelvic fluid collection.

Other: Appendix appears normal. There is a small ventral hernia
containing only fat. There is no ascites or abscess in the abdomen
or pelvis.

Musculoskeletal: There is slight anterior wedging of the L2
vertebral body. There is a prominent Schmorl's node along the
superior aspect of T12. There is degenerative change in the lumbar
spine. There are no blastic or lytic bone lesions. There is no
intramuscular or abdominal wall lesion.
IMPRESSION: Previous surgery in the upper abdomen hiatal hernia repair/ Nissen
fundoplication. No complicating features evident.

There are sigmoid diverticula without diverticulitis. No bowel
obstruction or appreciable bowel wall thickening.

No abscess. Appendix appears normal. No renal or ureteral calculi.
No hydronephrosis.

Aortic atherosclerosis.  No abdominal aortic aneurysm evident.

Age uncertain mild anterior wedging of the L2 vertebral body.
Multiple foci of degenerative type change in the lower thoracic and
lumbar regions.

## 2018-03-15 ENCOUNTER — Other Ambulatory Visit: Payer: Self-pay | Admitting: Internal Medicine

## 2018-03-15 DIAGNOSIS — J209 Acute bronchitis, unspecified: Secondary | ICD-10-CM

## 2018-03-15 MED FILL — ALBUTEROL SULFATE HFA 108 (: 108 (90 BAS | 25 days supply | Qty: 18 | Fill #0

## 2018-03-15 MED FILL — BREO ELLIPTA 200-25 MCG INH: 200-25 | 30 days supply | Qty: 60 | Fill #5

## 2018-03-28 ENCOUNTER — Other Ambulatory Visit: Payer: Self-pay | Admitting: Internal Medicine

## 2018-03-28 DIAGNOSIS — E785 Hyperlipidemia, unspecified: Secondary | ICD-10-CM

## 2018-03-28 MED FILL — EZETIMIBE-SIMVASTATIN 10-20: 10-20 | 90 days supply | Qty: 90 | Fill #0

## 2018-04-06 ENCOUNTER — Ambulatory Visit (INDEPENDENT_AMBULATORY_CARE_PROVIDER_SITE_OTHER): Payer: BC Managed Care – PPO | Admitting: Internal Medicine

## 2018-04-06 ENCOUNTER — Encounter: Payer: Self-pay | Admitting: Internal Medicine

## 2018-04-06 VITALS — BP 140/80 | HR 90 | Temp 98.4°F | Resp 16 | Ht 70.0 in | Wt 198.5 lb

## 2018-04-06 DIAGNOSIS — R7989 Other specified abnormal findings of blood chemistry: Secondary | ICD-10-CM | POA: Diagnosis not present

## 2018-04-06 DIAGNOSIS — I10 Essential (primary) hypertension: Secondary | ICD-10-CM

## 2018-04-06 NOTE — Patient Instructions (Signed)

## 2018-04-06 NOTE — Progress Notes (Signed)
Subjective:  Patient ID: Kenneth Sellers, male    DOB: 1955-07-25  Age: 63 y.o. MRN: [id number]  CC: Hypertension   HPI [name] presents for f/up - He tells me his blood pressure has been well controlled.  He tells me his respiratory symptoms have resolved.  He has felt well recently and offers no complaints.  Outpatient Medications Prior to Visit  Medication Sig Dispense Refill  . albuterol (VENTOLIN HFA) 108 (90 Base) MCG/ACT inhaler Inhale 1-2 puffs into the lungs every 6 (six) hours as needed for wheezing or shortness of breath. 18 g 5  . aliskiren (TEKTURNA) 150 MG tablet Take 1 tablet (150 mg total) by mouth daily. 90 tablet 1  . AMBULATORY NON FORMULARY MEDICATION CPAP machine nightly    . ezetimibe-simvastatin (VYTORIN) 10-20 MG tablet TAKE 1 TABLET BY MOUTH AT BEDTIME. 90 tablet 1  . fluticasone (FLONASE) 50 MCG/ACT nasal spray Place 2 sprays into both nostrils daily. 48 g 1  . fluticasone furoate-vilanterol (BREO ELLIPTA) 200-25 MCG/INH AEPB Inhale 1 puff into the lungs daily.    Marland Kitchen levocetirizine (XYZAL) 5 MG tablet Take 1 tablet (5 mg total) by mouth daily as needed. 90 tablet 1  . montelukast (SINGULAIR) 10 MG tablet Take 1 tablet (10 mg total) by mouth at bedtime. 90 tablet 1  . nystatin (MYCOSTATIN) 100000 UNIT/ML suspension Take 10 mLs (1,000,000 Units total) by mouth 4 (four) times daily. Swish, gargle and swish 250 mL 0  . Probiotic Product (RESTORA) CAPS Take 1 capsule by mouth daily. 90 capsule 1   No facility-administered medications prior to visit.     ROS Review of Systems  Constitutional: Negative for diaphoresis, fatigue and unexpected weight change.  HENT: Negative.   Eyes: Negative.   Respiratory: Negative for cough, shortness of breath and wheezing.   Cardiovascular: Negative for chest pain, palpitations and leg swelling.  Gastrointestinal: Negative for abdominal pain, constipation, diarrhea and nausea.  Endocrine: Negative for cold intolerance and heat  intolerance.  Genitourinary: Negative.  Negative for difficulty urinating.  Musculoskeletal: Negative.  Negative for arthralgias and myalgias.  Skin: Negative.  Negative for color change, pallor and rash.  Neurological: Negative.  Negative for dizziness, weakness, light-headedness and headaches.  Hematological: Negative for adenopathy. Does not bruise/bleed easily.  Psychiatric/Behavioral: Negative.     Objective:  BP 140/80 (BP Location: Left Arm, Patient Position: Sitting, Cuff Size: Large)   Pulse 90   Temp 98.4 F (36.9 C) (Oral)   Resp 16   Ht 5\' 10"  (1.778 m)   Wt 198 lb 8 oz (90 kg)   SpO2 94%   BMI 28.48 kg/m   BP Readings from Last 3 Encounters:  04/06/18 140/80  02/23/18 (!) 160/100  02/16/18 128/84    Wt Readings from Last 3 Encounters:  04/06/18 198 lb 8 oz (90 kg)  02/23/18 196 lb (88.9 kg)  02/16/18 195 lb 12.8 oz (88.8 kg)    Physical Exam Vitals signs reviewed.  Constitutional:      Appearance: He is not ill-appearing or diaphoretic.  HENT:     Nose: Nose normal. No congestion or rhinorrhea.     Mouth/Throat:     Mouth: Mucous membranes are moist.     Pharynx: Oropharynx is clear. No oropharyngeal exudate or posterior oropharyngeal erythema.  Eyes:     General: No scleral icterus.    Conjunctiva/sclera: Conjunctivae normal.  Neck:     Musculoskeletal: Normal range of motion and neck supple.  Cardiovascular:  Rate and Rhythm: Normal rate and regular rhythm.     Heart sounds: No murmur. No gallop.   Pulmonary:     Effort: Pulmonary effort is normal. No respiratory distress.     Breath sounds: Normal breath sounds. No stridor. No wheezing or rales.  Abdominal:     General: Abdomen is flat. There is no distension.     Palpations: There is no hepatomegaly, splenomegaly or mass.     Tenderness: There is no abdominal tenderness.  Musculoskeletal: Normal range of motion.        General: No swelling.     Right lower leg: No edema.     Left lower  leg: No edema.  Skin:    General: Skin is warm and dry.     Coloration: Skin is not pale.     Findings: No erythema or rash.  Neurological:     General: No focal deficit present.     Mental Status: He is oriented to person, place, and time. Mental status is at baseline.     Lab Results  Component Value Date   WBC 5.7 11/16/2017   HGB 16.2 11/16/2017   HCT 48.0 11/16/2017   PLT 189.0 11/16/2017   GLUCOSE 107 (H) 11/16/2017   CHOL 148 11/16/2017   TRIG 84.0 11/16/2017   HDL 50.90 11/16/2017   LDLCALC 80 11/16/2017   ALT 23 11/16/2017   AST 20 11/16/2017   NA 140 11/16/2017   K 3.7 11/16/2017   CL 101 11/16/2017   CREATININE 1.01 11/16/2017   BUN 15 11/16/2017   CO2 31 11/16/2017   TSH 4.44 11/16/2017   PSA 0.31 11/16/2017   HGBA1C 5.8 09/18/2014    [name] Contrast  Result Date: 03/31/2017 CLINICAL DATA:  63 y/o M; 3 weeks of severe headache with blurry vision in the right eye. EXAM: MRI HEAD WITHOUT CONTRAST TECHNIQUE: Multiplanar, multiecho pulse sequences of the brain and surrounding structures were obtained without intravenous contrast. COMPARISON:  None. FINDINGS: Brain: No acute infarction, hemorrhage, hydrocephalus, extra-axial collection or mass lesion. Vascular: Normal flow voids. Skull and upper cervical spine: Normal marrow signal. Sinuses/Orbits: Negative. Other: None. IMPRESSION: No acute intracranial abnormality. Unremarkable MRI of the brain for age. Electronically Signed   By: Kristine Garbe M.D.   On: 03/31/2017 20:38    Assessment & Plan:   Clancey was seen today for hypertension.  Diagnoses and all orders for this visit:  TSH elevation- He appears euthyroid.  I will monitor his TFTs to see if he has developed hypothyroidism. -     Thyroid Panel With TSH; Future  White coat syndrome with diagnosis of hypertension- His blood pressure is adequately well controlled.  I will monitor his electrolytes and renal function. -     Basic metabolic  panel; Future   I am having Ethaniel L. Postema maintain his AMBULATORY NON FORMULARY MEDICATION, fluticasone furoate-vilanterol, aliskiren, RESTORA, nystatin, fluticasone, levocetirizine, montelukast, albuterol, and ezetimibe-simvastatin.  No orders of the defined types were placed in this encounter.    Follow-up: Return in about 6 months (around 10/05/2018).  Scarlette Calico, MD

## 2018-04-18 ENCOUNTER — Ambulatory Visit: Payer: BC Managed Care – PPO | Admitting: Pulmonary Disease

## 2018-04-19 MED FILL — BREO ELLIPTA 200-25 MCG INH: 200-25 | 30 days supply | Qty: 60 | Fill #0

## 2018-04-27 ENCOUNTER — Other Ambulatory Visit: Payer: Self-pay | Admitting: Internal Medicine

## 2018-04-27 DIAGNOSIS — I10 Essential (primary) hypertension: Secondary | ICD-10-CM

## 2018-04-27 MED FILL — MONTELUKAST SOD 10 MG TAB: 10 | 90 days supply | Qty: 90 | Fill #1

## 2018-04-27 MED FILL — LEVOCETIRIZINE 5 MG TABLET: 5 | 90 days supply | Qty: 90 | Fill #1

## 2018-04-28 MED FILL — ALISKIREN FUMARATE 150 MG T: 150 | 90 days supply | Qty: 90 | Fill #0

## 2018-04-28 MED FILL — BREO ELLIPTA 200-25 MCG INH: 200-25 | 30 days supply | Qty: 60 | Fill #1

## 2018-05-03 ENCOUNTER — Other Ambulatory Visit: Payer: Self-pay

## 2018-05-03 ENCOUNTER — Ambulatory Visit (INDEPENDENT_AMBULATORY_CARE_PROVIDER_SITE_OTHER): Payer: BC Managed Care – PPO | Admitting: Pulmonary Disease

## 2018-05-03 ENCOUNTER — Encounter: Payer: Self-pay | Admitting: Pulmonary Disease

## 2018-05-03 VITALS — BP 126/82 | HR 93 | Ht 70.0 in | Wt 205.0 lb

## 2018-05-03 DIAGNOSIS — J4540 Moderate persistent asthma, uncomplicated: Secondary | ICD-10-CM | POA: Diagnosis not present

## 2018-05-03 DIAGNOSIS — Z9989 Dependence on other enabling machines and devices: Secondary | ICD-10-CM | POA: Diagnosis not present

## 2018-05-03 DIAGNOSIS — G4733 Obstructive sleep apnea (adult) (pediatric): Secondary | ICD-10-CM | POA: Diagnosis not present

## 2018-05-03 NOTE — Patient Instructions (Addendum)
Follow up in 6 months 

## 2018-05-03 NOTE — Progress Notes (Signed)
[facility]  Chief Complaint  Patient presents with  . Follow-up    Pt is doing well overall with cpap machine. Pt would like to talk with his asthma issues and concerns today.    Constitutional:  BP 126/82 (BP Location: Left Arm, Cuff Size: Normal)   Pulse 93   Ht 5\' 10"  (1.778 m)   Wt 205 lb (93 kg)   SpO2 99%   BMI 29.41 kg/m   Past Medical History:  Tremors, HLD, Diverticulosis  Brief Summary:  Kenneth Sellers is a 63 y.o. male with obstructive sleep apnea and allergic asthma.  He got rhinovirus from his granddaughter in December.  Had asthma flare.  Took several weeks to recover.  Was on steroids and ABx.  Had trouble sleeping.  Feels better now.  Goes to bed at 2 am, and wakes up at 9 am.  Feels rested.  Schedule doesn't interfere with his daytime activity.  Uses CPAP nightly.  No issues with mask fit.  Gets some sinus congestion in the morning, but better during the day.   Physical Exam:   Appearance - well kempt   ENMT - clear nasal mucosa, midline nasal  septum, no oral exudates, no LAN, trachea midline  Respiratory - normal chest wall, normal respiratory effort, no accessory muscle use, no wheeze/rales  CV - s1s2 regular rate and rhythm, no murmurs, no peripheral edema, radial pulses symmetric  GI - soft, non tender, no masses  Lymph - no adenopathy noted in neck and axillary areas  MSK - normal gait  Ext - no cyanosis, clubbing, or joint inflammation noted  Skin - no rashes, lesions, or ulcers  Neuro - normal strength, oriented x 3  Psych - normal mood and affect   Assessment/Plan:   Obstructive sleep apnea. - he is compliant with CPAP and reports benefit from therapy - continue auto CPAP  Allergic asthma. - continue breo, singulair, and prn albuterol  Allergic rhinitis. - continue xyzal, prn flonase - he is followed by Dr. Mosetta Sellers for allergy injections   Patient Instructions  Follow up in 6  months    Kenneth Mires, MD [place] Pager: [phone] 05/03/2018, 11:56 AM  Flow Sheet     Pulmonary tests:    Sleep tests:  PSG 08/26/02 >> AHI 59 Auto CPAP 04/03/18 to 05/02/18 >> used on 30 of 30 nights with average 7 hrs 6 min.  Average AHI 15 with median CPAP 7 and 95 th percentile CPAP 9 cm H2O  Medications:   Allergies as of 05/03/2018   No Known Allergies     Medication List       Accurate as of May 03, 2018 11:56 AM. Always use your most recent med list.        albuterol 108 (90 Base) MCG/ACT inhaler Commonly known as:  Ventolin HFA Inhale 1-2 puffs into the lungs every 6 (six) hours as needed for wheezing or shortness of breath.   aliskiren 150 MG tablet Commonly known as:  TEKTURNA TAKE 1 TABLET BY MOUTH DAILY.   AMBULATORY NON FORMULARY MEDICATION CPAP machine nightly   Breo Ellipta 200-25 MCG/INH Aepb Generic drug:  fluticasone furoate-vilanterol Inhale 1 puff into the lungs daily.   ezetimibe-simvastatin 10-20 MG tablet Commonly known as:  VYTORIN TAKE 1 TABLET BY MOUTH AT BEDTIME.   fluticasone 50 MCG/ACT nasal spray Commonly known as:  FLONASE Place 2 sprays into both nostrils daily.   levocetirizine 5 MG  tablet Commonly known as:  XYZAL Take 1 tablet (5 mg total) by mouth daily as needed.   montelukast 10 MG tablet Commonly known as:  SINGULAIR Take 1 tablet (10 mg total) by mouth at bedtime.   Restora Caps Take 1 capsule by mouth daily.       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  Family History:  His family history includes Allergies in his sister; Arthritis in his sister; COPD in his brother; Diabetes in his brother, mother, and sister; Heart attack (age of onset: 71) in his brother; Heart attack (age  of onset: 86) in his father; Heart disease in his brother; Heart failure in his mother; Hypertension in his brother and mother; Stroke (age of onset: 40) in his mother.  Social History:  He  reports that he has never smoked. He has never used smokeless tobacco. He reports current alcohol use of about 2.0 standard drinks of alcohol per week. He reports that he does not use drugs.

## 2018-06-09 ENCOUNTER — Other Ambulatory Visit: Payer: Self-pay | Admitting: Internal Medicine

## 2018-06-09 DIAGNOSIS — K580 Irritable bowel syndrome with diarrhea: Secondary | ICD-10-CM

## 2018-06-09 MED FILL — RESTORA CAPSULE: 90 days supply | Qty: 90 | Fill #0

## 2018-06-09 MED FILL — BREO ELLIPTA 200-25 MCG INH: 200-25 | 30 days supply | Qty: 60 | Fill #2

## 2018-06-19 DIAGNOSIS — Z961 Presence of intraocular lens: Secondary | ICD-10-CM | POA: Diagnosis not present

## 2018-06-19 DIAGNOSIS — H2512 Age-related nuclear cataract, left eye: Secondary | ICD-10-CM | POA: Diagnosis not present

## 2018-06-29 MED FILL — EZETIMIBE-SIMVASTATIN 10-20: 10-20 | 90 days supply | Qty: 90 | Fill #1

## 2018-07-14 DIAGNOSIS — G4733 Obstructive sleep apnea (adult) (pediatric): Secondary | ICD-10-CM | POA: Diagnosis not present

## 2018-08-02 MED FILL — LEVOCETIRIZINE 5 MG TABLET: 5 | 90 days supply | Qty: 90 | Fill #0

## 2018-08-02 MED FILL — BREO ELLIPTA 200-25 MCG INH: 200-25 | 30 days supply | Qty: 60 | Fill #3

## 2018-08-17 MED FILL — MONTELUKAST SOD 10 MG TAB: 10 | 90 days supply | Qty: 90 | Fill #2

## 2018-08-17 MED FILL — ALISKIREN FUMARATE 150 MG T: 150 | 90 days supply | Qty: 90 | Fill #1

## 2018-09-03 MED FILL — BREO ELLIPTA 200-25 MCG INH: 200-25 | 30 days supply | Qty: 60 | Fill #4

## 2018-09-06 MED FILL — RESTORA CAPSULE: 90 days supply | Qty: 90 | Fill #1

## 2018-09-20 DIAGNOSIS — J3089 Other allergic rhinitis: Secondary | ICD-10-CM | POA: Diagnosis not present

## 2018-09-20 DIAGNOSIS — J3081 Allergic rhinitis due to animal (cat) (dog) hair and dander: Secondary | ICD-10-CM | POA: Diagnosis not present

## 2018-09-20 DIAGNOSIS — J301 Allergic rhinitis due to pollen: Secondary | ICD-10-CM | POA: Diagnosis not present

## 2018-09-27 ENCOUNTER — Other Ambulatory Visit: Payer: Self-pay | Admitting: Internal Medicine

## 2018-09-27 DIAGNOSIS — J3089 Other allergic rhinitis: Secondary | ICD-10-CM | POA: Diagnosis not present

## 2018-09-27 DIAGNOSIS — J3081 Allergic rhinitis due to animal (cat) (dog) hair and dander: Secondary | ICD-10-CM | POA: Diagnosis not present

## 2018-09-27 DIAGNOSIS — E785 Hyperlipidemia, unspecified: Secondary | ICD-10-CM

## 2018-09-27 DIAGNOSIS — J301 Allergic rhinitis due to pollen: Secondary | ICD-10-CM | POA: Diagnosis not present

## 2018-09-27 MED FILL — EZETIMIBE-SIMVASTATIN 10-20: 10-20 | 90 days supply | Qty: 90 | Fill #0

## 2018-10-04 DIAGNOSIS — J3089 Other allergic rhinitis: Secondary | ICD-10-CM | POA: Diagnosis not present

## 2018-10-04 DIAGNOSIS — J301 Allergic rhinitis due to pollen: Secondary | ICD-10-CM | POA: Diagnosis not present

## 2018-10-04 DIAGNOSIS — J3081 Allergic rhinitis due to animal (cat) (dog) hair and dander: Secondary | ICD-10-CM | POA: Diagnosis not present

## 2018-10-11 DIAGNOSIS — J3089 Other allergic rhinitis: Secondary | ICD-10-CM | POA: Diagnosis not present

## 2018-10-11 DIAGNOSIS — J301 Allergic rhinitis due to pollen: Secondary | ICD-10-CM | POA: Diagnosis not present

## 2018-10-11 DIAGNOSIS — J3081 Allergic rhinitis due to animal (cat) (dog) hair and dander: Secondary | ICD-10-CM | POA: Diagnosis not present

## 2018-10-12 MED FILL — TRIAMCINOLONE ACETONIDE 0.1: 0.1 | 5 days supply | Qty: 5 | Fill #0

## 2018-10-18 DIAGNOSIS — J3081 Allergic rhinitis due to animal (cat) (dog) hair and dander: Secondary | ICD-10-CM | POA: Diagnosis not present

## 2018-10-18 DIAGNOSIS — J3089 Other allergic rhinitis: Secondary | ICD-10-CM | POA: Diagnosis not present

## 2018-10-18 DIAGNOSIS — J301 Allergic rhinitis due to pollen: Secondary | ICD-10-CM | POA: Diagnosis not present

## 2018-10-18 MED FILL — BREO ELLIPTA 200-25 MCG INH: 200-25 | 30 days supply | Qty: 60 | Fill #5

## 2018-10-25 DIAGNOSIS — J3081 Allergic rhinitis due to animal (cat) (dog) hair and dander: Secondary | ICD-10-CM | POA: Diagnosis not present

## 2018-10-25 DIAGNOSIS — J3089 Other allergic rhinitis: Secondary | ICD-10-CM | POA: Diagnosis not present

## 2018-10-25 DIAGNOSIS — J301 Allergic rhinitis due to pollen: Secondary | ICD-10-CM | POA: Diagnosis not present

## 2018-11-01 DIAGNOSIS — J301 Allergic rhinitis due to pollen: Secondary | ICD-10-CM | POA: Diagnosis not present

## 2018-11-01 DIAGNOSIS — J3081 Allergic rhinitis due to animal (cat) (dog) hair and dander: Secondary | ICD-10-CM | POA: Diagnosis not present

## 2018-11-01 DIAGNOSIS — J3089 Other allergic rhinitis: Secondary | ICD-10-CM | POA: Diagnosis not present

## 2018-11-03 MED FILL — LEVOCETIRIZINE 5 MG TABLET: 5 | 90 days supply | Qty: 90 | Fill #1

## 2018-11-14 ENCOUNTER — Other Ambulatory Visit: Payer: Self-pay | Admitting: Internal Medicine

## 2018-11-14 DIAGNOSIS — I10 Essential (primary) hypertension: Secondary | ICD-10-CM

## 2018-11-14 MED FILL — ALISKIREN FUMARATE 150 MG T: 150 | 90 days supply | Qty: 90 | Fill #0

## 2018-11-14 MED FILL — MONTELUKAST SOD 10 MG TAB: 10 | 90 days supply | Qty: 90 | Fill #3

## 2018-11-15 ENCOUNTER — Encounter: Payer: Self-pay | Admitting: Gastroenterology

## 2018-11-15 DIAGNOSIS — J3089 Other allergic rhinitis: Secondary | ICD-10-CM | POA: Diagnosis not present

## 2018-11-15 DIAGNOSIS — J301 Allergic rhinitis due to pollen: Secondary | ICD-10-CM | POA: Diagnosis not present

## 2018-11-15 DIAGNOSIS — J3081 Allergic rhinitis due to animal (cat) (dog) hair and dander: Secondary | ICD-10-CM | POA: Diagnosis not present

## 2018-11-16 MED FILL — FLUAD QUADRIVALENT 0.5 ML P: 0.5 | 1 days supply | Qty: 1 | Fill #0

## 2018-11-22 ENCOUNTER — Encounter: Payer: Self-pay | Admitting: Gastroenterology

## 2018-11-22 MED FILL — BREO ELLIPTA 200-25 MCG INH: 200-25 | 30 days supply | Qty: 60 | Fill #0

## 2018-11-23 DIAGNOSIS — J301 Allergic rhinitis due to pollen: Secondary | ICD-10-CM | POA: Diagnosis not present

## 2018-11-23 DIAGNOSIS — J3089 Other allergic rhinitis: Secondary | ICD-10-CM | POA: Diagnosis not present

## 2018-11-23 DIAGNOSIS — J3081 Allergic rhinitis due to animal (cat) (dog) hair and dander: Secondary | ICD-10-CM | POA: Diagnosis not present

## 2018-12-01 ENCOUNTER — Other Ambulatory Visit: Payer: Self-pay

## 2018-12-01 ENCOUNTER — Ambulatory Visit (AMBULATORY_SURGERY_CENTER): Payer: Self-pay | Admitting: *Deleted

## 2018-12-01 VITALS — Temp 97.1°F | Ht 70.0 in | Wt 194.0 lb

## 2018-12-01 DIAGNOSIS — Z1159 Encounter for screening for other viral diseases: Secondary | ICD-10-CM

## 2018-12-01 DIAGNOSIS — Z86010 Personal history of colonic polyps: Secondary | ICD-10-CM

## 2018-12-01 MED ORDER — NA SULFATE-K SULFATE-MG SULF 17.5-3.13-1.6 GM/177ML PO SOLN: 1.0000 | Freq: Once | ORAL | 0 refills | Status: AC

## 2018-12-01 MED FILL — SUPREP BOWEL PREP KIT: 17.5-3.13-1 | 2 days supply | Qty: 354 | Fill #0

## 2018-12-01 NOTE — Progress Notes (Signed)
No egg or soy allergy known to patient  No issues with past sedation with any surgeries  or procedures, no intubation problems  No diet pills per patient No home 02 use per patient  No blood thinners per patient  Pt denies issues with constipation  No A fib or A flutter  EMMI video sent to pt's e mail   Due to the COVID-19 pandemic we are asking patients to follow these guidelines. Please only bring one care partner. Please be aware that your care partner may wait in the car in the parking lot or if they feel like they will be too hot to wait in the car, they may wait in the lobby on the 4th floor. All care partners are required to wear a mask the entire time (we do not have any that we can provide them), they need to practice social distancing, and we will do a Covid check for all patient's and care partners when you arrive. Also we will check their temperature and your temperature. If the care partner waits in their car they need to stay in the parking lot the entire time and we will call them on their cell phone when the patient is ready for discharge so they can bring the car to the front of the building. Also all patient's will need to wear a mask into building.  COVID 12/08/18,10:40 AM  Suprep coupon provided

## 2018-12-06 DIAGNOSIS — J3089 Other allergic rhinitis: Secondary | ICD-10-CM | POA: Diagnosis not present

## 2018-12-06 DIAGNOSIS — J3081 Allergic rhinitis due to animal (cat) (dog) hair and dander: Secondary | ICD-10-CM | POA: Diagnosis not present

## 2018-12-06 DIAGNOSIS — J301 Allergic rhinitis due to pollen: Secondary | ICD-10-CM | POA: Diagnosis not present

## 2018-12-08 ENCOUNTER — Other Ambulatory Visit: Payer: Self-pay | Admitting: Gastroenterology

## 2018-12-08 LAB — SARS CORONAVIRUS 2 (TAT 6-24 HRS): SARS Coronavirus 2: NEGATIVE

## 2018-12-13 ENCOUNTER — Ambulatory Visit (AMBULATORY_SURGERY_CENTER): Payer: 59 | Admitting: Gastroenterology

## 2018-12-13 ENCOUNTER — Encounter: Payer: Self-pay | Admitting: Gastroenterology

## 2018-12-13 ENCOUNTER — Other Ambulatory Visit: Payer: Self-pay

## 2018-12-13 VITALS — BP 117/70 | HR 71 | Temp 98.3°F | Resp 18 | Ht 70.0 in | Wt 194.0 lb

## 2018-12-13 DIAGNOSIS — Z86010 Personal history of colonic polyps: Secondary | ICD-10-CM

## 2018-12-13 DIAGNOSIS — Z1211 Encounter for screening for malignant neoplasm of colon: Secondary | ICD-10-CM | POA: Diagnosis not present

## 2018-12-13 DIAGNOSIS — D124 Benign neoplasm of descending colon: Secondary | ICD-10-CM

## 2018-12-13 LAB — HM COLONOSCOPY

## 2018-12-13 MED ORDER — SODIUM CHLORIDE 0.9 % IV SOLN: 500.0000 mL | Freq: Once | INTRAVENOUS | Status: DC

## 2018-12-13 NOTE — Progress Notes (Signed)
PT taken to PACU. Monitors in place. VSS. Report given to RN. 

## 2018-12-13 NOTE — Patient Instructions (Signed)
Information on polyps, diverticulosis and hemorrhoids given to you today.  Await pathology results.  YOU HAD AN ENDOSCOPIC PROCEDURE TODAY AT THE [facility]:   Refer to the procedure report that was given to you for any specific questions about what was found during the examination.  If the procedure report does not answer your questions, please call your gastroenterologist to clarify.  If you requested that your care partner not be given the details of your procedure findings, then the procedure report has been included in a sealed envelope for you to review at your convenience later.  YOU SHOULD EXPECT: Some feelings of bloating in the abdomen. Passage of more gas than usual.  Walking can help get rid of the air that was put into your GI tract during the procedure and reduce the bloating. If you had a lower endoscopy (such as a colonoscopy or flexible sigmoidoscopy) you may notice spotting of blood in your stool or on the toilet paper. If you underwent a bowel prep for your procedure, you may not have a normal bowel movement for a few days.  Please Note:  You might notice some irritation and congestion in your nose or some drainage.  This is from the oxygen used during your procedure.  There is no need for concern and it should clear up in a day or so.  SYMPTOMS TO REPORT IMMEDIATELY:   Following lower endoscopy (colonoscopy or flexible sigmoidoscopy):  Excessive amounts of blood in the stool  Significant tenderness or worsening of abdominal pains  Swelling of the abdomen that is new, acute  Fever of 100F or higher   For urgent or emergent issues, a gastroenterologist can be reached at any hour by calling [phone].   DIET:  We do recommend a small meal at first, but then you may proceed to your regular diet.  Drink plenty of fluids but you should avoid alcoholic beverages for 24 hours.  ACTIVITY:  You should plan to take it easy for the rest of today and you should NOT  DRIVE or use heavy machinery until tomorrow (because of the sedation medicines used during the test).    FOLLOW UP: Our staff will call the number listed on your records 48-72 hours following your procedure to check on you and address any questions or concerns that you may have regarding the information given to you following your procedure. If we do not reach you, we will leave a message.  We will attempt to reach you two times.  During this call, we will ask if you have developed any symptoms of COVID 19. If you develop any symptoms (ie: fever, flu-like symptoms, shortness of breath, cough etc.) before then, please call [phone].  If you test positive for Covid 19 in the 2 weeks post procedure, please call and report this information to us.    If any biopsies were taken you will be contacted by phone or by letter within the next 1-3 weeks.  Please call us at [phone] if you have not heard about the biopsies in 3 weeks.    SIGNATURES/CONFIDENTIALITY: You and/or your care partner have signed paperwork which will be entered into your electronic medical record.  These signatures attest to the fact that that the information above on your After Visit Summary has been reviewed and is understood.  Full responsibility of the confidentiality of this discharge information lies with you and/or your care-partner. 

## 2018-12-13 NOTE — Progress Notes (Signed)
Called to room to assist during endoscopic procedure.  Patient ID and intended procedure confirmed with present staff. Received instructions for my participation in the procedure from the performing physician.  

## 2018-12-13 NOTE — Op Note (Signed)
[facility] Patient Name: Kenneth Sellers Procedure Date: 12/13/2018 10:35 AM MRN: [id number] Endoscopist: Ladene Artist , MD Age: 63 Referring MD:  Date of Birth: 10/21/1955 Gender: Male Account #: [id number] Procedure:                Colonoscopy Indications:              Surveillance: Personal history of adenomatous                            polyps on last colonoscopy 3 years ago Medicines:                Monitored Anesthesia Care Procedure:                Pre-Anesthesia Assessment:                           - Prior to the procedure, a History and Physical                            was performed, and patient medications and                            allergies were reviewed. The patient's tolerance of                            previous anesthesia was also reviewed. The risks                            and benefits of the procedure and the sedation                            options and risks were discussed with the patient.                            All questions were answered, and informed consent                            was obtained. Prior Anticoagulants: The patient has                            taken no previous anticoagulant or antiplatelet                            agents. ASA Grade Assessment: II - A patient with                            mild systemic disease. After reviewing the risks                            and benefits, the patient was deemed in                            satisfactory condition to undergo the procedure.  After obtaining informed consent, the colonoscope                            was passed under direct vision. Throughout the                            procedure, the patient's blood pressure, pulse, and                            oxygen saturations were monitored continuously. The                            Colonoscope was introduced through the anus and                            advanced to the the cecum,  identified by                            appendiceal orifice and ileocecal valve. The                            ileocecal valve, appendiceal orifice, and rectum                            were photographed. The quality of the bowel                            preparation was good. The colonoscopy was performed                            without difficulty. The patient tolerated the                            procedure well. Scope In: 10:49:24 AM Scope Out: 11:02:16 AM Scope Withdrawal Time: 0 hours 10 minutes 48 seconds  Total Procedure Duration: 0 hours 12 minutes 52 seconds  Findings:                 The perianal and digital rectal examinations were                            normal.                           Two sessile polyps were found in the descending                            colon. The polyps were 5 to 6 mm in size. These                            polyps were removed with a cold snare. Resection                            and retrieval were complete.  Multiple small-mouthed diverticula were found in                            the left colon. There was narrowing of the colon in                            association with the diverticular opening. There                            was no evidence of diverticular bleeding.                           Internal hemorrhoids were found during                            retroflexion. The hemorrhoids were small and Grade                            I (internal hemorrhoids that do not prolapse).                           The exam was otherwise without abnormality on                            direct and retroflexion views. Complications:            No immediate complications. Estimated blood loss:                            None. Estimated Blood Loss:     Estimated blood loss: none. Impression:               - Two 5 to 6 mm polyps in the descending colon,                            removed with a cold snare.  Resected and retrieved.                           - Mild diverticulosis in the left colon.                           - Internal hemorrhoids.                           - The examination was otherwise normal on direct                            and retroflexion views. Recommendation:           - Repeat colonoscopy date to be determined after                            pending pathology results are reviewed for                            surveillance based on pathology  results.                           - Patient has a contact number available for                            emergencies. The signs and symptoms of potential                            delayed complications were discussed with the                            patient. Return to normal activities tomorrow.                            Written discharge instructions were provided to the                            patient.                           - High fiber diet.                           - Continue present medications.                           - Await pathology results. Ladene Artist, MD 12/13/2018 11:08:09 AM This report has been signed electronically.

## 2018-12-13 NOTE — Progress Notes (Signed)
Pt's states no medical or surgical changes since previsit or office visit.  Temp per June VS per Courtney 

## 2018-12-15 ENCOUNTER — Telehealth: Payer: Self-pay

## 2018-12-15 ENCOUNTER — Other Ambulatory Visit: Payer: Self-pay | Admitting: Internal Medicine

## 2018-12-15 DIAGNOSIS — K580 Irritable bowel syndrome with diarrhea: Secondary | ICD-10-CM

## 2018-12-15 NOTE — Telephone Encounter (Signed)
  Follow up Call-  Call back number 12/13/2018  Post procedure Call Back phone  # [phone]  Permission to leave phone message Yes  Some recent data might be hidden     Patient questions:  Do you have a fever, pain , or abdominal swelling? No. Pain Score  0 *  Have you tolerated food without any problems? Yes.    Have you been able to return to your normal activities? Yes.    Do you have any questions about your discharge instructions: Diet   No. Medications  No. Follow up visit  No.  Do you have questions or concerns about your Care? Yes.    Actions: * If pain score is 4 or above: No action needed, pain <4.  1. Have you developed a fever since your procedure? no  2.   Have you had an respiratory symptoms (SOB or cough) since your procedure? no  3.   Have you tested positive for COVID 19 since your procedure no  4.   Have you had any family members/close contacts diagnosed with the COVID 19 since your procedure?  no   If yes to any of these questions please route to Joylene John, RN and Alphonsa Gin, Therapist, sports.

## 2018-12-20 DIAGNOSIS — J301 Allergic rhinitis due to pollen: Secondary | ICD-10-CM | POA: Diagnosis not present

## 2018-12-20 DIAGNOSIS — J3081 Allergic rhinitis due to animal (cat) (dog) hair and dander: Secondary | ICD-10-CM | POA: Diagnosis not present

## 2018-12-20 DIAGNOSIS — J3089 Other allergic rhinitis: Secondary | ICD-10-CM | POA: Diagnosis not present

## 2018-12-22 ENCOUNTER — Encounter: Payer: Self-pay | Admitting: Gastroenterology

## 2018-12-22 DIAGNOSIS — J3089 Other allergic rhinitis: Secondary | ICD-10-CM | POA: Diagnosis not present

## 2018-12-22 DIAGNOSIS — J3081 Allergic rhinitis due to animal (cat) (dog) hair and dander: Secondary | ICD-10-CM | POA: Diagnosis not present

## 2018-12-25 HISTORY — PX: OTHER SURGICAL HISTORY: SHX169

## 2018-12-25 LAB — HM COLONOSCOPY

## 2018-12-27 MED FILL — EZETIMIBE-SIMVASTATIN 10-20: 10-20 | 90 days supply | Qty: 90 | Fill #1

## 2018-12-28 ENCOUNTER — Ambulatory Visit (INDEPENDENT_AMBULATORY_CARE_PROVIDER_SITE_OTHER): Payer: BC Managed Care – PPO | Admitting: Internal Medicine

## 2018-12-28 ENCOUNTER — Encounter: Payer: Self-pay | Admitting: Internal Medicine

## 2018-12-28 ENCOUNTER — Other Ambulatory Visit (INDEPENDENT_AMBULATORY_CARE_PROVIDER_SITE_OTHER): Payer: 59

## 2018-12-28 ENCOUNTER — Other Ambulatory Visit: Payer: Self-pay

## 2018-12-28 VITALS — BP 136/84 | HR 79 | Temp 98.2°F | Resp 16 | Ht 70.0 in | Wt 191.0 lb

## 2018-12-28 DIAGNOSIS — I10 Essential (primary) hypertension: Secondary | ICD-10-CM

## 2018-12-28 DIAGNOSIS — E785 Hyperlipidemia, unspecified: Secondary | ICD-10-CM

## 2018-12-28 DIAGNOSIS — Z23 Encounter for immunization: Secondary | ICD-10-CM | POA: Diagnosis not present

## 2018-12-28 DIAGNOSIS — R7989 Other specified abnormal findings of blood chemistry: Secondary | ICD-10-CM

## 2018-12-28 DIAGNOSIS — J3081 Allergic rhinitis due to animal (cat) (dog) hair and dander: Secondary | ICD-10-CM | POA: Diagnosis not present

## 2018-12-28 DIAGNOSIS — J3089 Other allergic rhinitis: Secondary | ICD-10-CM | POA: Diagnosis not present

## 2018-12-28 DIAGNOSIS — Z0000 Encounter for general adult medical examination without abnormal findings: Secondary | ICD-10-CM

## 2018-12-28 DIAGNOSIS — J301 Allergic rhinitis due to pollen: Secondary | ICD-10-CM | POA: Diagnosis not present

## 2018-12-28 LAB — URINALYSIS, ROUTINE W REFLEX MICROSCOPIC
Bilirubin Urine: NEGATIVE
Hgb urine dipstick: NEGATIVE
Ketones, ur: NEGATIVE
Leukocytes,Ua: NEGATIVE
Nitrite: NEGATIVE
Specific Gravity, Urine: 1.025 (ref 1.000–1.030)
Total Protein, Urine: NEGATIVE
Urine Glucose: NEGATIVE
Urobilinogen, UA: 0.2 (ref 0.0–1.0)
WBC, UA: NONE SEEN (ref 0–?)
pH: 6 (ref 5.0–8.0)

## 2018-12-28 LAB — LIPID PANEL
Cholesterol: 177 mg/dL (ref 0–200)
HDL: 54.8 mg/dL (ref >39.00)
LDL Cholesterol: 105 mg/dL — ABNORMAL HIGH (ref 0–99)
NonHDL: 121.78
Total CHOL/HDL Ratio: 3
Triglycerides: 84 mg/dL (ref 0.0–149.0)
VLDL: 16.8 mg/dL (ref 0.0–40.0)

## 2018-12-28 LAB — CBC WITH DIFFERENTIAL/PLATELET
Basophils Absolute: 0.1 10*3/uL (ref 0.0–0.1)
Basophils Relative: 1.7 % (ref 0.0–3.0)
Eosinophils Absolute: 0.2 10*3/uL (ref 0.0–0.7)
Eosinophils Relative: 3.6 % (ref 0.0–5.0)
HCT: 46.8 % (ref 39.0–52.0)
Hemoglobin: 16 g/dL (ref 13.0–17.0)
Lymphocytes Relative: 24.4 % (ref 12.0–46.0)
Lymphs Abs: 1.4 10*3/uL (ref 0.7–4.0)
MCHC: 34.1 g/dL (ref 30.0–36.0)
MCV: 91.4 fl (ref 78.0–100.0)
Monocytes Absolute: 0.8 10*3/uL (ref 0.1–1.0)
Monocytes Relative: 14.3 % — ABNORMAL HIGH (ref 3.0–12.0)
Neutro Abs: 3.3 10*3/uL (ref 1.4–7.7)
Neutrophils Relative %: 56 % (ref 43.0–77.0)
Platelets: 204 10*3/uL (ref 150.0–400.0)
RBC: 5.12 Mil/uL (ref 4.22–5.81)
RDW: 13 % (ref 11.5–15.5)
WBC: 5.8 10*3/uL (ref 4.0–10.5)

## 2018-12-28 LAB — HEPATIC FUNCTION PANEL
ALT: 20 U/L (ref 0–53)
AST: 22 U/L (ref 0–37)
Albumin: 4.7 g/dL (ref 3.5–5.2)
Alkaline Phosphatase: 56 U/L (ref 39–117)
Bilirubin, Direct: 0.2 mg/dL (ref 0.0–0.3)
Total Bilirubin: 0.9 mg/dL (ref 0.2–1.2)
Total Protein: 6.8 g/dL (ref 6.0–8.3)

## 2018-12-28 LAB — BASIC METABOLIC PANEL WITH GFR
BUN: 13 mg/dL (ref 6–23)
CO2: 28 meq/L (ref 19–32)
Calcium: 9.5 mg/dL (ref 8.4–10.5)
Chloride: 103 meq/L (ref 96–112)
Creatinine, Ser: 0.94 mg/dL (ref 0.40–1.50)
GFR: 80.49 mL/min
Glucose, Bld: 109 mg/dL — ABNORMAL HIGH (ref 70–99)
Potassium: 4.1 meq/L (ref 3.5–5.1)
Sodium: 140 meq/L (ref 135–145)

## 2018-12-28 LAB — PSA: PSA: 0.32 ng/mL (ref 0.10–4.00)

## 2018-12-28 LAB — TSH: TSH: 2.24 u[IU]/mL (ref 0.35–4.50)

## 2018-12-28 MED FILL — BREO ELLIPTA 200-25 MCG INH: 200-25 | 30 days supply | Qty: 60 | Fill #1

## 2018-12-28 NOTE — Patient Instructions (Signed)

## 2018-12-28 NOTE — Progress Notes (Signed)
Subjective:  Patient ID: Kenneth Sellers, male    DOB: 1955/11/04  Age: 63 y.o. MRN: [id number]  CC: Annual Exam  This visit occurred during the SARS-CoV-2 public health emergency.  Safety protocols were in place, including screening questions prior to the visit, additional usage of staff PPE, and extensive cleaning of exam room while observing appropriate contact time as indicated for disinfecting solutions.    HPI Kenneth Sellers presents for a CPX.  He tells me his blood pressure has been well controlled.  He is very active and denies any recent episodes of CP, DOE, cough, edema, shortness of breath, or fatigue.  He has intentionally lost weight with lifestyle modifications.  Outpatient Medications Prior to Visit  Medication Sig Dispense Refill  . albuterol (VENTOLIN HFA) 108 (90 Base) MCG/ACT inhaler Inhale 1-2 puffs into the lungs every 6 (six) hours as needed for wheezing or shortness of breath. 18 g 5  . aliskiren (TEKTURNA) 150 MG tablet TAKE 1 TABLET BY MOUTH DAILY. 90 tablet 1  . AMBULATORY NON FORMULARY MEDICATION CPAP machine nightly    . ezetimibe-simvastatin (VYTORIN) 10-20 MG tablet TAKE 1 TABLET BY MOUTH AT BEDTIME 90 tablet 1  . fluticasone (FLONASE) 50 MCG/ACT nasal spray Place 2 sprays into both nostrils daily. 48 g 1  . fluticasone furoate-vilanterol (BREO ELLIPTA) 200-25 MCG/INH AEPB Inhale 1 puff into the lungs daily.    Marland Kitchen levocetirizine (XYZAL) 5 MG tablet Take 1 tablet (5 mg total) by mouth daily as needed. 90 tablet 1  . montelukast (SINGULAIR) 10 MG tablet Take 1 tablet (10 mg total) by mouth at bedtime. 90 tablet 1  . Multiple Vitamins-Minerals (MULTIVITAMIN ADULTS PO) Take by mouth.    . Probiotic Product (RESTORA) CAPS TAKE 1 CAPSULE BY MOUTH DAILY. 90 capsule 1  . triamcinolone (KENALOG) 0.1 % paste      No facility-administered medications prior to visit.     ROS Review of Systems  Constitutional: Negative for appetite change, diaphoresis, fatigue and  unexpected weight change.  HENT: Negative.   Eyes: Negative for visual disturbance.  Respiratory: Negative for cough, chest tightness, shortness of breath and wheezing.   Cardiovascular: Negative for chest pain, palpitations and leg swelling.  Gastrointestinal: Negative for abdominal pain, constipation, diarrhea, nausea and vomiting.  Endocrine: Negative.  Negative for cold intolerance and heat intolerance.  Genitourinary: Negative.  Negative for difficulty urinating, dysuria, hematuria, scrotal swelling, testicular pain and urgency.  Musculoskeletal: Negative.  Negative for arthralgias and myalgias.  Skin: Negative.  Negative for color change, pallor and rash.  Neurological: Negative.  Negative for dizziness, weakness and light-headedness.  Hematological: Negative for adenopathy. Does not bruise/bleed easily.  Psychiatric/Behavioral: Negative.     Objective:  BP 136/84 (BP Location: Left Arm, Patient Position: Sitting, Cuff Size: Normal)   Pulse 79   Temp 98.2 F (36.8 C) (Oral)   Resp 16   Ht 5\' 10"  (1.778 m)   Wt 191 lb (86.6 kg)   SpO2 97%   BMI 27.41 kg/m   BP Readings from Last 3 Encounters:  12/28/18 136/84  12/13/18 117/70  05/03/18 126/82    Wt Readings from Last 3 Encounters:  12/28/18 191 lb (86.6 kg)  12/13/18 194 lb (88 kg)  12/01/18 194 lb (88 kg)    Physical Exam Vitals signs reviewed.  Constitutional:      Appearance: Normal appearance.  HENT:     Nose: Nose normal.     Mouth/Throat:     Mouth: Mucous  membranes are moist.  Eyes:     General: No scleral icterus.    Conjunctiva/sclera: Conjunctivae normal.  Neck:     Musculoskeletal: Neck supple.  Cardiovascular:     Rate and Rhythm: Normal rate and regular rhythm.     Heart sounds: No murmur.  Pulmonary:     Effort: Pulmonary effort is normal.     Breath sounds: No stridor. No wheezing, rhonchi or rales.  Abdominal:     General: Abdomen is flat. Bowel sounds are normal. There is no distension.      Palpations: Abdomen is soft. There is no hepatomegaly, splenomegaly or mass.     Tenderness: There is no abdominal tenderness.     Hernia: No hernia is present.  Genitourinary:    Pubic Area: No rash.      Penis: Uncircumcised. No phimosis, paraphimosis, hypospadias, erythema, tenderness, discharge, swelling or lesions.      Scrotum/Testes: Normal.        Right: Mass or tenderness not present.        Left: Tenderness not present.     Epididymis:     Right: Normal. Not inflamed or enlarged.     Left: Not inflamed or enlarged.     Prostate: Normal. Not enlarged, not tender and no nodules present.     Rectum: Normal. Guaiac result negative. No mass, tenderness, anal fissure, external hemorrhoid or internal hemorrhoid. Normal anal tone.  Musculoskeletal: Normal range of motion.     Right lower leg: No edema.     Left lower leg: No edema.  Lymphadenopathy:     Cervical: No cervical adenopathy.  Skin:    General: Skin is warm and dry.     Coloration: Skin is not pale.     Findings: No rash.  Neurological:     General: No focal deficit present.     Mental Status: He is alert.  Psychiatric:        Mood and Affect: Mood normal.        Behavior: Behavior normal.     Lab Results  Component Value Date   WBC 5.8 12/28/2018   HGB 16.0 12/28/2018   HCT 46.8 12/28/2018   PLT 204.0 12/28/2018   GLUCOSE 109 (H) 12/28/2018   CHOL 177 12/28/2018   TRIG 84.0 12/28/2018   HDL 54.80 12/28/2018   LDLCALC 105 (H) 12/28/2018   ALT 20 12/28/2018   AST 22 12/28/2018   NA 140 12/28/2018   K 4.1 12/28/2018   CL 103 12/28/2018   CREATININE 0.94 12/28/2018   BUN 13 12/28/2018   CO2 28 12/28/2018   TSH 2.24 12/28/2018   TSH 2.31 12/28/2018   PSA 0.32 12/28/2018   HGBA1C 5.8 09/18/2014    [name] Contrast  Result Date: 03/31/2017 CLINICAL DATA:  63 y/o M; 3 weeks of severe headache with blurry vision in the right eye. EXAM: MRI HEAD WITHOUT CONTRAST TECHNIQUE: Multiplanar, multiecho  pulse sequences of the brain and surrounding structures were obtained without intravenous contrast. COMPARISON:  None. FINDINGS: Brain: No acute infarction, hemorrhage, hydrocephalus, extra-axial collection or mass lesion. Vascular: Normal flow voids. Skull and upper cervical spine: Normal marrow signal. Sinuses/Orbits: Negative. Other: None. IMPRESSION: No acute intracranial abnormality. Unremarkable MRI of the brain for age. Electronically Signed   By: Kristine Garbe M.D.   On: 03/31/2017 20:38    Assessment & Plan:   Milbern was seen today for annual exam.  Diagnoses and all orders for this visit:  White coat  syndrome with diagnosis of hypertension- His blood pressure is adequately well controlled.  Electrolytes and renal function are normal. -     CBC with Differential; Future -     Basic metabolic panel; Future -     Urinalysis, Routine w reflex microscopic; Future  Hyperlipidemia with target LDL less than 130- He has achieved his LDL goal and is doing well on the statin. -     TSH; Future -     Hepatic function panel; Future  Routine general medical examination at a health care facility- Exam completed, labs reviewed, vaccines reviewed and updated, colon cancer screening is up-to-date, patient education material was given. -     Lipid panel; Future -     PSA; Future -     HIV antibody (Reflex); Future  TSH elevation- His TSH is normal now and clinically he is euthyroid. -     TSH; Future  Need for pneumococcal vaccination -     Pneumococcal conjugate vaccine 13-valent   I am having Karon L. Poirier maintain his AMBULATORY NON FORMULARY MEDICATION, fluticasone furoate-vilanterol, fluticasone, levocetirizine, montelukast, albuterol, ezetimibe-simvastatin, aliskiren, triamcinolone, Multiple Vitamins-Minerals (MULTIVITAMIN ADULTS PO), and Restora.  No orders of the defined types were placed in this encounter.    Follow-up: Return in about 6 months (around 06/27/2019).   Scarlette Calico, MD

## 2018-12-29 ENCOUNTER — Encounter: Payer: Self-pay | Admitting: Internal Medicine

## 2018-12-29 LAB — HIV ANTIBODY (ROUTINE TESTING W REFLEX): HIV 1&2 Ab, 4th Generation: NONREACTIVE

## 2018-12-29 LAB — THYROID PANEL WITH TSH
Free Thyroxine Index: 2.2 (ref 1.4–3.8)
T3 Uptake: 28 % (ref 22–35)
T4, Total: 7.9 ug/dL (ref 4.9–10.5)
TSH: 2.31 mIU/L (ref 0.40–4.50)

## 2019-01-19 DIAGNOSIS — J3081 Allergic rhinitis due to animal (cat) (dog) hair and dander: Secondary | ICD-10-CM | POA: Diagnosis not present

## 2019-01-19 DIAGNOSIS — J3089 Other allergic rhinitis: Secondary | ICD-10-CM | POA: Diagnosis not present

## 2019-01-19 DIAGNOSIS — J301 Allergic rhinitis due to pollen: Secondary | ICD-10-CM | POA: Diagnosis not present

## 2019-01-24 DIAGNOSIS — J4540 Moderate persistent asthma, uncomplicated: Secondary | ICD-10-CM | POA: Diagnosis not present

## 2019-01-24 DIAGNOSIS — J301 Allergic rhinitis due to pollen: Secondary | ICD-10-CM | POA: Diagnosis not present

## 2019-01-24 DIAGNOSIS — J3081 Allergic rhinitis due to animal (cat) (dog) hair and dander: Secondary | ICD-10-CM | POA: Diagnosis not present

## 2019-01-24 DIAGNOSIS — J3089 Other allergic rhinitis: Secondary | ICD-10-CM | POA: Diagnosis not present

## 2019-01-24 MED FILL — FLUTICASONE PROP 50 MCG SPR: 50 | 30 days supply | Qty: 16 | Fill #0

## 2019-01-26 MED FILL — BREO ELLIPTA 200-25 MCG INH: 200-25 | 30 days supply | Qty: 60 | Fill #2

## 2019-01-30 DIAGNOSIS — J3089 Other allergic rhinitis: Secondary | ICD-10-CM | POA: Diagnosis not present

## 2019-01-30 DIAGNOSIS — J3081 Allergic rhinitis due to animal (cat) (dog) hair and dander: Secondary | ICD-10-CM | POA: Diagnosis not present

## 2019-01-30 DIAGNOSIS — J301 Allergic rhinitis due to pollen: Secondary | ICD-10-CM | POA: Diagnosis not present

## 2019-02-06 DIAGNOSIS — J3089 Other allergic rhinitis: Secondary | ICD-10-CM | POA: Diagnosis not present

## 2019-02-06 DIAGNOSIS — J301 Allergic rhinitis due to pollen: Secondary | ICD-10-CM | POA: Diagnosis not present

## 2019-02-06 DIAGNOSIS — J3081 Allergic rhinitis due to animal (cat) (dog) hair and dander: Secondary | ICD-10-CM | POA: Diagnosis not present

## 2019-02-07 MED FILL — LEVOCETIRIZINE 5 MG TABLET: 5 | 90 days supply | Qty: 90 | Fill #0

## 2019-02-12 ENCOUNTER — Telehealth: Payer: Self-pay | Admitting: Pulmonary Disease

## 2019-02-12 DIAGNOSIS — G4733 Obstructive sleep apnea (adult) (pediatric): Secondary | ICD-10-CM

## 2019-02-12 NOTE — Telephone Encounter (Signed)
Spoke with the pt  He is needing new CPAP supplies  Order sent to [facility]

## 2019-02-13 DIAGNOSIS — J3089 Other allergic rhinitis: Secondary | ICD-10-CM | POA: Diagnosis not present

## 2019-02-13 DIAGNOSIS — J301 Allergic rhinitis due to pollen: Secondary | ICD-10-CM | POA: Diagnosis not present

## 2019-02-13 DIAGNOSIS — J3081 Allergic rhinitis due to animal (cat) (dog) hair and dander: Secondary | ICD-10-CM | POA: Diagnosis not present

## 2019-02-13 MED FILL — MONTELUKAST SOD 10 MG TAB: 10 | 90 days supply | Qty: 90 | Fill #4

## 2019-02-13 MED FILL — ALISKIREN FUMARATE 150 MG T: 150 | 90 days supply | Qty: 90 | Fill #1

## 2019-02-14 DIAGNOSIS — G4733 Obstructive sleep apnea (adult) (pediatric): Secondary | ICD-10-CM | POA: Diagnosis not present

## 2019-02-20 DIAGNOSIS — J301 Allergic rhinitis due to pollen: Secondary | ICD-10-CM | POA: Diagnosis not present

## 2019-02-20 DIAGNOSIS — J3081 Allergic rhinitis due to animal (cat) (dog) hair and dander: Secondary | ICD-10-CM | POA: Diagnosis not present

## 2019-02-20 DIAGNOSIS — J3089 Other allergic rhinitis: Secondary | ICD-10-CM | POA: Diagnosis not present

## 2019-02-27 DIAGNOSIS — J3081 Allergic rhinitis due to animal (cat) (dog) hair and dander: Secondary | ICD-10-CM | POA: Diagnosis not present

## 2019-02-27 DIAGNOSIS — J3089 Other allergic rhinitis: Secondary | ICD-10-CM | POA: Diagnosis not present

## 2019-02-27 DIAGNOSIS — J301 Allergic rhinitis due to pollen: Secondary | ICD-10-CM | POA: Diagnosis not present

## 2019-02-27 MED FILL — EPINEPHRINE 0.3 MG AUTO-INJ: 0.3 | 2 days supply | Qty: 2 | Fill #0

## 2019-03-06 DIAGNOSIS — J3089 Other allergic rhinitis: Secondary | ICD-10-CM | POA: Diagnosis not present

## 2019-03-06 DIAGNOSIS — J3081 Allergic rhinitis due to animal (cat) (dog) hair and dander: Secondary | ICD-10-CM | POA: Diagnosis not present

## 2019-03-06 DIAGNOSIS — J301 Allergic rhinitis due to pollen: Secondary | ICD-10-CM | POA: Diagnosis not present

## 2019-03-13 DIAGNOSIS — J301 Allergic rhinitis due to pollen: Secondary | ICD-10-CM | POA: Diagnosis not present

## 2019-03-13 DIAGNOSIS — J3089 Other allergic rhinitis: Secondary | ICD-10-CM | POA: Diagnosis not present

## 2019-03-13 DIAGNOSIS — J3081 Allergic rhinitis due to animal (cat) (dog) hair and dander: Secondary | ICD-10-CM | POA: Diagnosis not present

## 2019-03-15 MED FILL — BREO ELLIPTA 200-25 MCG INH: 200-25 | 30 days supply | Qty: 60 | Fill #0

## 2019-03-25 ENCOUNTER — Ambulatory Visit: Payer: 59

## 2019-03-26 ENCOUNTER — Other Ambulatory Visit: Payer: Self-pay

## 2019-03-26 ENCOUNTER — Emergency Department (HOSPITAL_COMMUNITY)
Admission: EM | Admit: 2019-03-26 | Discharge: 2019-03-26 | Disposition: A | Discharge status: Home / Self Care | Payer: 59 | Attending: Emergency Medicine | Admitting: Emergency Medicine

## 2019-03-26 ENCOUNTER — Emergency Department (HOSPITAL_COMMUNITY): Payer: 59

## 2019-03-26 ENCOUNTER — Encounter (HOSPITAL_COMMUNITY): Payer: Self-pay | Admitting: Family Medicine

## 2019-03-26 DIAGNOSIS — I10 Essential (primary) hypertension: Secondary | ICD-10-CM | POA: Diagnosis not present

## 2019-03-26 DIAGNOSIS — Z79899 Other long term (current) drug therapy: Secondary | ICD-10-CM | POA: Insufficient documentation

## 2019-03-26 DIAGNOSIS — J45909 Unspecified asthma, uncomplicated: Secondary | ICD-10-CM | POA: Diagnosis not present

## 2019-03-26 DIAGNOSIS — N200 Calculus of kidney: Secondary | ICD-10-CM | POA: Diagnosis not present

## 2019-03-26 DIAGNOSIS — N132 Hydronephrosis with renal and ureteral calculous obstruction: Secondary | ICD-10-CM | POA: Diagnosis not present

## 2019-03-26 DIAGNOSIS — R1032 Left lower quadrant pain: Secondary | ICD-10-CM | POA: Diagnosis not present

## 2019-03-26 LAB — CBC WITH DIFFERENTIAL/PLATELET
Abs Immature Granulocytes: 0.01 10*3/uL (ref 0.00–0.07)
Basophils Absolute: 0.1 10*3/uL (ref 0.0–0.1)
Basophils Relative: 1 %
Eosinophils Absolute: 0.3 10*3/uL (ref 0.0–0.5)
Eosinophils Relative: 4 %
HCT: 52.1 % — ABNORMAL HIGH (ref 39.0–52.0)
Hemoglobin: 17.6 g/dL — ABNORMAL HIGH (ref 13.0–17.0)
Immature Granulocytes: 0 %
Lymphocytes Relative: 22 %
Lymphs Abs: 1.5 10*3/uL (ref 0.7–4.0)
MCH: 31.2 pg (ref 26.0–34.0)
MCHC: 33.8 g/dL (ref 30.0–36.0)
MCV: 92.4 fL (ref 80.0–100.0)
Monocytes Absolute: 1.2 10*3/uL — ABNORMAL HIGH (ref 0.1–1.0)
Monocytes Relative: 18 %
Neutro Abs: 3.6 10*3/uL (ref 1.7–7.7)
Neutrophils Relative %: 55 %
Platelets: 219 10*3/uL (ref 150–400)
RBC: 5.64 MIL/uL (ref 4.22–5.81)
RDW: 12.5 % (ref 11.5–15.5)
WBC: 6.6 10*3/uL (ref 4.0–10.5)
nRBC: 0 % (ref 0.0–0.2)

## 2019-03-26 LAB — COMPREHENSIVE METABOLIC PANEL
ALT: 43 U/L (ref 0–44)
AST: 39 U/L (ref 15–41)
Albumin: 4.5 g/dL (ref 3.5–5.0)
Alkaline Phosphatase: 63 U/L (ref 38–126)
Anion gap: 11 (ref 5–15)
BUN: 16 mg/dL (ref 8–23)
CO2: 25 mmol/L (ref 22–32)
Calcium: 9.4 mg/dL (ref 8.9–10.3)
Chloride: 101 mmol/L (ref 98–111)
Creatinine, Ser: 1.09 mg/dL (ref 0.61–1.24)
GFR calc Af Amer: >60 mL/min (ref >60)
GFR calc non Af Amer: >60 mL/min (ref >60)
Glucose, Bld: 133 mg/dL — ABNORMAL HIGH (ref 70–99)
Potassium: 4 mmol/L (ref 3.5–5.1)
Sodium: 137 mmol/L (ref 135–145)
Total Bilirubin: 0.9 mg/dL (ref 0.3–1.2)
Total Protein: 7.8 g/dL (ref 6.5–8.1)

## 2019-03-26 LAB — I-STAT CHEM 8, ED
BUN: 15 mg/dL (ref 8–23)
Calcium, Ion: 1.17 mmol/L (ref 1.15–1.40)
Chloride: 101 mmol/L (ref 98–111)
Creatinine, Ser: 1.1 mg/dL (ref 0.61–1.24)
Glucose, Bld: 127 mg/dL — ABNORMAL HIGH (ref 70–99)
HCT: 51 % (ref 39.0–52.0)
Hemoglobin: 17.3 g/dL — ABNORMAL HIGH (ref 13.0–17.0)
Potassium: 4 mmol/L (ref 3.5–5.1)
Sodium: 139 mmol/L (ref 135–145)
TCO2: 26 mmol/L (ref 22–32)

## 2019-03-26 LAB — URINALYSIS, ROUTINE W REFLEX MICROSCOPIC
Bacteria, UA: NONE SEEN
Bilirubin Urine: NEGATIVE
Glucose, UA: NEGATIVE mg/dL
Ketones, ur: NEGATIVE mg/dL
Leukocytes,Ua: NEGATIVE
Nitrite: NEGATIVE
Protein, ur: NEGATIVE mg/dL
Specific Gravity, Urine: 1.04 — ABNORMAL HIGH (ref 1.005–1.030)
pH: 5 (ref 5.0–8.0)

## 2019-03-26 LAB — LIPASE, BLOOD: Lipase: 368 U/L — ABNORMAL HIGH (ref 11–51)

## 2019-03-26 MED ORDER — HYDROMORPHONE HCL 1 MG/ML IJ SOLN
1.0000 mg | Freq: Once | INTRAMUSCULAR | Status: AC
  Administered 2019-03-26: 1 mg via INTRAVENOUS
  Filled 2019-03-26: qty 1

## 2019-03-26 MED ORDER — OXYCODONE HCL 5 MG PO TABS
5.0000 mg | ORAL_TABLET | Freq: Once | ORAL | Status: AC
  Administered 2019-03-26: 5 mg via ORAL
  Filled 2019-03-26: qty 1

## 2019-03-26 MED ORDER — SODIUM CHLORIDE 0.9 % IV BOLUS
1000.0000 mL | Freq: Once | INTRAVENOUS | Status: AC
  Administered 2019-03-26: 1000 mL via INTRAVENOUS

## 2019-03-26 MED ORDER — SODIUM CHLORIDE 0.9 % IV BOLUS
500.0000 mL | Freq: Once | INTRAVENOUS | Status: AC
  Administered 2019-03-26: 500 mL via INTRAVENOUS

## 2019-03-26 MED ORDER — ONDANSETRON HCL 4 MG/2ML IJ SOLN
4.0000 mg | Freq: Once | INTRAMUSCULAR | Status: AC
  Administered 2019-03-26: 4 mg via INTRAVENOUS
  Filled 2019-03-26: qty 2

## 2019-03-26 MED ORDER — SODIUM CHLORIDE (PF) 0.9 % IJ SOLN
INTRAMUSCULAR | Status: AC
  Filled 2019-03-26: qty 50

## 2019-03-26 MED ORDER — KETOROLAC TROMETHAMINE 30 MG/ML IJ SOLN
30.0000 mg | Freq: Once | INTRAMUSCULAR | Status: AC
  Administered 2019-03-26: 30 mg via INTRAVENOUS
  Filled 2019-03-26: qty 1

## 2019-03-26 MED ORDER — OXYCODONE HCL 5 MG PO TABS: 5.0000 mg | ORAL_TABLET | Freq: Four times a day (QID) | ORAL | 0 refills | Status: DC | PRN

## 2019-03-26 MED ORDER — ONDANSETRON HCL 4 MG PO TABS: 4.0000 mg | ORAL_TABLET | Freq: Three times a day (TID) | ORAL | 0 refills | Status: DC | PRN

## 2019-03-26 MED ORDER — IOHEXOL 300 MG/ML  SOLN
100.0000 mL | Freq: Once | INTRAMUSCULAR | Status: AC | PRN
  Administered 2019-03-26: 100 mL via INTRAVENOUS

## 2019-03-26 MED ORDER — TAMSULOSIN HCL 0.4 MG PO CAPS: 0.4000 mg | ORAL_CAPSULE | Freq: Every day | ORAL | 0 refills | Status: AC

## 2019-03-26 MED FILL — TAMSULOSIN HCL 0.4 MG CAP: 0.4 | 5 days supply | Qty: 5 | Fill #0

## 2019-03-26 MED FILL — oxyCODONE HCL 5 MG TABS: 5 | 3 days supply | Qty: 15 | Fill #0

## 2019-03-26 MED FILL — ONDANSETRON HCL 4 MG TABLET: 4 | 4 days supply | Qty: 12 | Fill #0

## 2019-03-26 NOTE — ED Notes (Signed)
Pt attempting to void at this time.

## 2019-03-26 NOTE — ED Provider Notes (Signed)
Kenneth Sellers   Kenneth Sellers Arrival date & time: 03/26/19  1147     History Chief Complaint  Patient presents with  . Abdominal Pain    Kenneth Sellers is a 64 y.o. male.  The history is provided by the patient.  Abdominal Pain Pain location:  LLQ Pain quality: sharp and stabbing   Pain radiates to:  Does not radiate Pain severity:  Severe Onset quality:  Sudden Duration:  2 hours Timing:  Constant Progression:  Unchanged Chronicity:  New Context: previous surgery   Relieved by:  Nothing Worsened by:  Nothing Associated symptoms: constipation, diarrhea and nausea   Associated symptoms: no anorexia, no chest pain, no chills, no cough, no dysuria, no fever, no hematuria, no shortness of breath, no sore throat and no vomiting   Associated symptoms comment:  Had diarrhea earlier in the week and now maybe constipation? Risk factors: multiple surgeries        Past Medical History:  Diagnosis Date  . Allergic rhinitis    Dr. Donneta Romberg  . Allergy   . Arthritis    hands  . Asthma   . Cataract    right eye,12/01/18  . Diverticulosis of colon   . GERD (gastroesophageal reflux disease)   . Hyperlipidemia   . Hypertension   . Neuromuscular disorder ([place])    essential tremors  . OSA (obstructive sleep apnea)    PSG 08/26/02 AHI 59--CPAP 7 cm  . Pneumonia 1988   treated as OP  . Sleep apnea    wears cpap   . Tachycardia 2003   Dr. Mare Ferrari, negative cardiac work up    Patient Active Problem List   Diagnosis Date Noted  . TSH elevation 08/10/2017  . Bilateral hearing loss 10/10/2015  . Routine general medical examination at a health care facility 09/23/2015  . White coat syndrome with diagnosis of hypertension 09/23/2015  . OBSTRUCTIVE SLEEP APNEA 11/07/2009  . ECZEMA, ATOPIC 05/15/2008  . Hyperlipidemia with target LDL less than 130 01/17/2007  . Allergic rhinitis 01/17/2007  . Asthma 01/17/2007    Past  Surgical History:  Procedure Laterality Date  . colon tics  03/2005   [facility]  . COLONOSCOPY  03/2005  . lesion removed L neck; pigmented mole at hairline, non melanoma  07/2003  . NISSEN FUNDOPLICATION  0000000    Dr Margot Chimes; complicated by herniation post op with retching requiring 2nd surgery  . POLYPECTOMY    . RHINOPLASTY  1983       Family History  Problem Relation Age of Onset  . Heart attack Father 17  . Diabetes Mother   . Hypertension Mother   . Stroke Mother 64  . Heart failure Mother   . Diabetes Sister   . Allergies Sister   . Hypertension Brother   . Heart disease Brother   . Diabetes Brother        2 brothers  . Heart attack Brother 26       smoker  . COPD Brother        died in his 72s  . Arthritis Sister   . Cancer Neg Hx   . Early death Neg Hx   . Hyperlipidemia Neg Hx   . Kidney disease Neg Hx   . Colon cancer Neg Hx   . Colon polyps Neg Hx   . Esophageal cancer Neg Hx   . Rectal cancer Neg Hx   . Stomach cancer Neg Hx  Social History   Tobacco Use  . Smoking status: Never Smoker  . Smokeless tobacco: Never Used  Substance Use Topics  . Alcohol use: Yes    Alcohol/week: 2.0 standard drinks    Types: 2 Cans of beer per week    Comment: occasionally  . Drug use: No    Home Medications Prior to Admission medications   Medication Sig Start Date End Date Taking? Authorizing Provider  albuterol (VENTOLIN HFA) 108 (90 Base) MCG/ACT inhaler Inhale 1-2 puffs into the lungs every 6 (six) hours as needed for wheezing or shortness of breath. 03/15/18  Yes Janith Lima, MD  aliskiren (TEKTURNA) 150 MG tablet TAKE 1 TABLET BY MOUTH DAILY. 11/14/18  Yes Janith Lima, MD  ezetimibe-simvastatin (VYTORIN) 10-20 MG tablet TAKE 1 TABLET BY MOUTH AT BEDTIME Patient taking differently: Take 1 tablet by mouth daily at 6 PM.  09/27/18  Yes Janith Lima, MD  fluticasone (FLONASE) 50 MCG/ACT nasal spray Place 2 sprays into both nostrils daily. Patient  taking differently: Place 2 sprays into both nostrils daily as needed for allergies.  02/25/18  Yes Janith Lima, MD  fluticasone furoate-vilanterol (BREO ELLIPTA) 200-25 MCG/INH AEPB Inhale 1 puff into the lungs daily.   Yes [provider]  levocetirizine (XYZAL) 5 MG tablet Take 1 tablet (5 mg total) by mouth daily as needed. Patient taking differently: Take 5 mg by mouth every evening.  02/25/18  Yes Janith Lima, MD  montelukast (SINGULAIR) 10 MG tablet Take 1 tablet (10 mg total) by mouth at bedtime. 02/25/18  Yes Janith Lima, MD  Multiple Vitamins-Minerals (MULTIVITAMIN ADULTS PO) Take 1 tablet by mouth daily.    Yes [provider]  Probiotic Product (RESTORA) CAPS TAKE 1 CAPSULE BY MOUTH DAILY. 12/15/18  Yes Janith Lima, MD  AMBULATORY NON FORMULARY MEDICATION CPAP machine nightly    [provider]  EPINEPHrine 0.3 mg/0.3 mL IJ SOAJ injection Inject 0.3 mg into the skin once as needed for anaphylaxis. 02/27/19   [provider]  ondansetron (ZOFRAN) 4 MG tablet Take 1 tablet (4 mg total) by mouth every 8 (eight) hours as needed for up to 12 doses for nausea or vomiting. 03/26/19   Wayne Brunker, DO  oxyCODONE (ROXICODONE) 5 MG immediate release tablet Take 1 tablet (5 mg total) by mouth every 6 (six) hours as needed for up to 15 doses for severe pain. 03/26/19   Itati Brocksmith, DO  tamsulosin (FLOMAX) 0.4 MG CAPS capsule Take 1 capsule (0.4 mg total) by mouth daily for 5 days. 03/26/19 03/31/19  Lennice Sites, DO    Allergies    Patient has no known allergies.  Review of Systems   Review of Systems  Constitutional: Negative for chills and fever.  HENT: Negative for ear pain and sore throat.   Eyes: Negative for pain and visual disturbance.  Respiratory: Negative for cough and shortness of breath.   Cardiovascular: Negative for chest pain and palpitations.  Gastrointestinal: Positive for abdominal distention, abdominal pain, constipation,  diarrhea and nausea. Negative for anorexia and vomiting.  Genitourinary: Negative for dysuria and hematuria.  Musculoskeletal: Negative for arthralgias and back pain.  Skin: Negative for color change and rash.  Neurological: Negative for seizures and syncope.  All other systems reviewed and are negative.   Physical Exam Updated Vital Signs  ED Triage Vitals  Enc Vitals Group     BP 03/26/19 1157 (!) 147/107     Pulse Rate 03/26/19 1157  90     Resp 03/26/19 1157 20     Temp 03/26/19 1216 98.3 F (36.8 C)     Temp Source 03/26/19 1216 Oral     SpO2 03/26/19 1157 98 %     Weight 03/26/19 1155 195 lb (88.5 kg)     Height 03/26/19 1155 5\' 10"  (1.778 m)     Head Circumference --      Peak Flow --      Pain Score 03/26/19 1155 10     Pain Loc --      Pain Edu? --      Excl. in [place]? --     Physical Exam Vitals and nursing Sellers reviewed.  Constitutional:      General: He is in acute distress.     Appearance: He is well-developed. He is ill-appearing.  HENT:     Head: Normocephalic and atraumatic.     Mouth/Throat:     Mouth: Mucous membranes are moist.  Eyes:     Extraocular Movements: Extraocular movements intact.     Conjunctiva/sclera: Conjunctivae normal.     Pupils: Pupils are equal, round, and reactive to light.  Cardiovascular:     Rate and Rhythm: Normal rate and regular rhythm.     Heart sounds: Normal heart sounds. No murmur.  Pulmonary:     Effort: Pulmonary effort is normal. No respiratory distress.     Breath sounds: Normal breath sounds.  Abdominal:     General: Abdomen is protuberant. There is distension.     Palpations: Abdomen is soft.     Tenderness: There is generalized abdominal tenderness. There is guarding.  Musculoskeletal:     Cervical back: Neck supple.  Skin:    General: Skin is warm and dry.     Capillary Refill: Capillary refill takes less than 2 seconds.  Neurological:     General: No focal deficit present.     Mental Status: He is alert.   Psychiatric:        Mood and Affect: Mood normal.     ED Results / Procedures / Treatments   Labs (all labs ordered are listed, but only abnormal results are displayed) Labs Reviewed  CBC WITH DIFFERENTIAL/PLATELET - Abnormal; Notable for the following components:      Result Value   Hemoglobin 17.6 (*)    HCT 52.1 (*)    Monocytes Absolute 1.2 (*)    All other components within normal limits  COMPREHENSIVE METABOLIC PANEL - Abnormal; Notable for the following components:   Glucose, Bld 133 (*)    All other components within normal limits  LIPASE, BLOOD - Abnormal; Notable for the following components:   Lipase 368 (*)    All other components within normal limits  URINALYSIS, ROUTINE W REFLEX MICROSCOPIC - Abnormal; Notable for the following components:   Specific Gravity, Urine 1.040 (*)    Hgb urine dipstick MODERATE (*)    All other components within normal limits  I-STAT CHEM 8, ED - Abnormal; Notable for the following components:   Glucose, Bld 127 (*)    Hemoglobin 17.3 (*)    All other components within normal limits  URINE CULTURE    EKG None  Radiology CT ABDOMEN PELVIS W CONTRAST  Result Date: 03/26/2019 CLINICAL DATA:  Abdominal pain and distension.  Dysuria. EXAM: CT ABDOMEN AND PELVIS WITH CONTRAST TECHNIQUE: Multidetector CT imaging of the abdomen and pelvis was performed using the standard protocol following bolus administration of intravenous contrast. CONTRAST:  170mL  OMNIPAQUE IOHEXOL 300 MG/ML  SOLN COMPARISON:  October 27, 2015. FINDINGS: Lower chest: There is slight atelectatic change in the left base. No lung base edema or consolidation. Hepatobiliary: There is borderline hepatic steatosis. No focal liver lesions are evident. The gallbladder wall is not appreciably thickened. There is no biliary duct dilatation. Pancreas: There is no pancreatic mass or inflammatory focus. Spleen: No splenic lesions are evident. Adrenals/Urinary Tract: Adrenals  bilaterally appear normal. There is a cyst arising from the medial upper pole of the left kidney measuring 1.8 x 1.2 cm. There is slight hydronephrosis on the left. There is no hydronephrosis on the right. There is no intrarenal calculus on either side. There is a 3 x 3 mm calculus at the left ureterovesical junction. No other ureteral calculi are evident. Urinary bladder is midline with wall thickness within normal limits. Stomach/Bowel: There are surgical clips in the left upper abdomen near the stomach consistent with previous Nissen fundoplication type procedure. There is no bowel wall thickening or fluid in this area of postoperative change. Elsewhere, there is no appreciable bowel wall or mesenteric thickening. There are occasional sigmoid diverticula without diverticulitis. There is no evident bowel obstruction. The terminal ileum appears normal. There is no evident free air or portal venous air. Vascular/Lymphatic: No abdominal aortic aneurysm. There is atherosclerotic calcification in the aorta and common iliac arteries. Major venous structures appear patent. There is no evident adenopathy in the abdomen or pelvis. Reproductive: There is minimal prostatic calcification. Prostate and seminal vesicles are normal in size and contour. No evident pelvic mass. Other: The appendix appears unremarkable. No evident abscess or ascites in the abdomen or pelvis. There is a small ventral hernia immediately to the right of midline in the mid abdominal region containing fat but no bowel. There is muscle thinning at the umbilicus anteriorly. Musculoskeletal: Slight anterior wedging at L2 is stable. Scattered areas of degenerative change in the lumbar and lower thoracic spine. No blastic or lytic bone lesions. No intramuscular lesion evident. IMPRESSION: 1. 3 x 3 mm calculus at the left ureterovesical junction with mild hydronephrosis on the left. 2. Postoperative change in the perigastric region. No inflammatory focus in  this area. No bowel obstruction. No abscess in the abdomen or pelvis. Appendix appears normal. There are occasional sigmoid diverticula without diverticulitis. 3.  Borderline hepatic steatosis. 4. Ventral hernia immediately to the right of midline in the mid abdomen containing fat but no bowel. Electronically Signed   By: Lowella Grip III M.D.   On: 03/26/2019 13:24   DG Abdomen Acute W/Chest  Result Date: 03/26/2019 CLINICAL DATA:  Left lower quadrant pain EXAM: DG ABDOMEN ACUTE W/ 1V CHEST COMPARISON:  None. FINDINGS: Cardiac shadow is within normal limits. The lungs are well aerated bilaterally. No acute bony abnormality is seen. Scattered large and small bowel gas is noted. No free air is seen. Postsurgical changes are noted in the left upper quadrant. No abnormal mass or abnormal calcifications are noted. No acute bony abnormality is seen. Mild degenerative changes of the lumbar spine are noted. IMPRESSION: No free air is seen. No acute abnormality in the chest and abdomen. Electronically Signed   By: Inez Catalina M.D.   On: 03/26/2019 12:56    Procedures Procedures (including critical care time)  Medications Ordered in ED Medications  oxyCODONE (Oxy IR/ROXICODONE) immediate release tablet 5 mg (has no administration in time range)  HYDROmorphone (DILAUDID) injection 1 mg (1 mg Intravenous Given 03/26/19 1224)  sodium chloride 0.9 %  bolus 1,000 mL (1,000 mLs Intravenous Bolus 03/26/19 1225)  ondansetron (ZOFRAN) injection 4 mg (4 mg Intravenous Given 03/26/19 1224)  HYDROmorphone (DILAUDID) injection 1 mg (1 mg Intravenous Given 03/26/19 1301)  iohexol (OMNIPAQUE) 300 MG/ML solution 100 mL (100 mLs Intravenous Contrast Given 03/26/19 1303)  sodium chloride (PF) 0.9 % injection (  Contrast Given 03/26/19 1305)  ketorolac (TORADOL) 30 MG/ML injection 30 mg (30 mg Intravenous Given 03/26/19 1447)  sodium chloride 0.9 % bolus 500 mL (500 mLs Intravenous New Bag/Given (Non-Interop) 03/26/19 1446)     ED Course  I have reviewed the triage vital signs and the nursing notes.  Pertinent labs & imaging results that were available during my care of the patient were reviewed by me and considered in my medical decision making (see chart for details).    MDM Rules/Calculators/A&P  Kenneth Sellers is a 64 year old male with history of diverticulosis, reflux, hypertension who presents to the ED with left lower quadrant abdominal pain.  Patient with normal vitals.  No fever.  Patient in obvious distress upon arrival.  Tenderness to the left lower quadrant with guarding.  Concern for kidney stone versus diverticulitis versus perforation versus abscess versus bowel obstruction.  Lab work including acute abdominal series and CT scan abdomen and pelvis were ordered.  Patient with no signs of urinary tract infection.  CT scan showed left-sided 3 x 3 mm kidney stone in the left UVJ.  Mild hydronephrosis.  However no AKI, no urinary tract infection.  Pain improved following multiple doses of IV Dilaudid, IV Toradol, IV fluids, IV Zofran.  Lipase was elevated however CT scan showed no pancreatic inflammation.  Gallbladder and liver enzymes otherwise unremarkable as well.  No leukocytosis, no anemia.  Patient was educated about kidney stones.  Given prescription for narcotics, Zofran, Flomax.  Given information to follow-up with urology.  Suspect that the stone will pass.  Lipase overall elevated but given further education about return precautions for pancreatitis although this is likely incidental finding.  Discharged from ED in good condition.  Understands return precautions.  This chart was dictated using voice recognition software.  Despite best efforts to proofread,  errors can occur which can change the documentation meaning.    Final Clinical Impression(s) / ED Diagnoses Final diagnoses:  Kidney stone    Rx / DC Orders ED Discharge Orders         Ordered    oxyCODONE (ROXICODONE) 5 MG immediate  release tablet  Every 6 hours PRN     03/26/19 1624    ondansetron (ZOFRAN) 4 MG tablet  Every 8 hours PRN     03/26/19 1624    tamsulosin (FLOMAX) 0.4 MG CAPS capsule  Daily     03/26/19 1624           [place], DO 03/26/19 1628

## 2019-03-26 NOTE — ED Notes (Signed)
Pt requesting additional pain medication. DO made aware

## 2019-03-26 NOTE — ED Triage Notes (Signed)
Patient is complaining of sudden onset of left lower abd pain that started about 2 hours ago. He states he attempted to have a bowel movement that was a small, hard amount. Also, he states he has had burning with urination. Denies nausea, vomiting, or diarrhea.

## 2019-03-26 NOTE — Discharge Instructions (Addendum)
Take medications as prescribed.  Continue Tylenol Motrin as well for pain.  Please return to the emergency department if symptoms worsen.  As discussed your pancreatic enzyme was mildly elevated today.  If you develop any central abdominal pain, nausea, vomiting, fever please return to the ED as well.  Otherwise her pancreas on CT scan was normal.  I believe your pain today is from your kidney stone that I believe will pass.

## 2019-03-27 ENCOUNTER — Other Ambulatory Visit: Payer: Self-pay | Admitting: Internal Medicine

## 2019-03-27 DIAGNOSIS — E785 Hyperlipidemia, unspecified: Secondary | ICD-10-CM

## 2019-03-27 LAB — URINE CULTURE: Culture: <10000 — AB

## 2019-03-27 MED FILL — EZETIMIBE-SIMVASTATIN 10-20: 10-20 | 90 days supply | Qty: 90 | Fill #0

## 2019-03-28 DIAGNOSIS — N131 Hydronephrosis with ureteral stricture, not elsewhere classified: Secondary | ICD-10-CM | POA: Diagnosis not present

## 2019-03-28 DIAGNOSIS — N201 Calculus of ureter: Secondary | ICD-10-CM | POA: Diagnosis not present

## 2019-03-28 MED FILL — TAMSULOSIN HCL 0.4 MG CAP: 0.4 | 30 days supply | Qty: 30 | Fill #0

## 2019-04-04 DIAGNOSIS — J301 Allergic rhinitis due to pollen: Secondary | ICD-10-CM | POA: Diagnosis not present

## 2019-04-04 DIAGNOSIS — J3089 Other allergic rhinitis: Secondary | ICD-10-CM | POA: Diagnosis not present

## 2019-04-04 DIAGNOSIS — J3081 Allergic rhinitis due to animal (cat) (dog) hair and dander: Secondary | ICD-10-CM | POA: Diagnosis not present

## 2019-04-06 DIAGNOSIS — J301 Allergic rhinitis due to pollen: Secondary | ICD-10-CM | POA: Diagnosis not present

## 2019-04-17 MED FILL — BREO ELLIPTA 200-25 MCG INH: 200-25 | 30 days supply | Qty: 60 | Fill #1

## 2019-04-18 DIAGNOSIS — J3089 Other allergic rhinitis: Secondary | ICD-10-CM | POA: Diagnosis not present

## 2019-04-18 DIAGNOSIS — J301 Allergic rhinitis due to pollen: Secondary | ICD-10-CM | POA: Diagnosis not present

## 2019-04-18 DIAGNOSIS — J3081 Allergic rhinitis due to animal (cat) (dog) hair and dander: Secondary | ICD-10-CM | POA: Diagnosis not present

## 2019-05-02 DIAGNOSIS — J301 Allergic rhinitis due to pollen: Secondary | ICD-10-CM | POA: Diagnosis not present

## 2019-05-02 DIAGNOSIS — J3081 Allergic rhinitis due to animal (cat) (dog) hair and dander: Secondary | ICD-10-CM | POA: Diagnosis not present

## 2019-05-02 DIAGNOSIS — J3089 Other allergic rhinitis: Secondary | ICD-10-CM | POA: Diagnosis not present

## 2019-05-09 DIAGNOSIS — J3081 Allergic rhinitis due to animal (cat) (dog) hair and dander: Secondary | ICD-10-CM | POA: Diagnosis not present

## 2019-05-09 DIAGNOSIS — J3089 Other allergic rhinitis: Secondary | ICD-10-CM | POA: Diagnosis not present

## 2019-05-09 DIAGNOSIS — J301 Allergic rhinitis due to pollen: Secondary | ICD-10-CM | POA: Diagnosis not present

## 2019-05-10 ENCOUNTER — Other Ambulatory Visit: Payer: Self-pay | Admitting: Internal Medicine

## 2019-05-10 DIAGNOSIS — I10 Essential (primary) hypertension: Secondary | ICD-10-CM

## 2019-05-10 MED FILL — LEVOCETIRIZINE 5 MG TABLET: 5 | 90 days supply | Qty: 90 | Fill #1

## 2019-05-10 MED FILL — MONTELUKAST SOD 10 MG TAB: 10 | 90 days supply | Qty: 90 | Fill #0

## 2019-05-10 MED FILL — ALISKIREN FUMARATE 150 MG T: 150 | 90 days supply | Qty: 90 | Fill #0

## 2019-05-10 MED FILL — BREO ELLIPTA 200-25 MCG INH: 200-25 | 30 days supply | Qty: 60 | Fill #2

## 2019-05-21 DIAGNOSIS — J301 Allergic rhinitis due to pollen: Secondary | ICD-10-CM | POA: Diagnosis not present

## 2019-05-21 DIAGNOSIS — J3081 Allergic rhinitis due to animal (cat) (dog) hair and dander: Secondary | ICD-10-CM | POA: Diagnosis not present

## 2019-05-21 DIAGNOSIS — J3089 Other allergic rhinitis: Secondary | ICD-10-CM | POA: Diagnosis not present

## 2019-05-24 DIAGNOSIS — J301 Allergic rhinitis due to pollen: Secondary | ICD-10-CM | POA: Diagnosis not present

## 2019-05-30 DIAGNOSIS — J3089 Other allergic rhinitis: Secondary | ICD-10-CM | POA: Diagnosis not present

## 2019-05-30 DIAGNOSIS — J301 Allergic rhinitis due to pollen: Secondary | ICD-10-CM | POA: Diagnosis not present

## 2019-05-30 DIAGNOSIS — J3081 Allergic rhinitis due to animal (cat) (dog) hair and dander: Secondary | ICD-10-CM | POA: Diagnosis not present

## 2019-06-06 DIAGNOSIS — J3081 Allergic rhinitis due to animal (cat) (dog) hair and dander: Secondary | ICD-10-CM | POA: Diagnosis not present

## 2019-06-06 DIAGNOSIS — J301 Allergic rhinitis due to pollen: Secondary | ICD-10-CM | POA: Diagnosis not present

## 2019-06-06 DIAGNOSIS — J3089 Other allergic rhinitis: Secondary | ICD-10-CM | POA: Diagnosis not present

## 2019-06-13 DIAGNOSIS — J301 Allergic rhinitis due to pollen: Secondary | ICD-10-CM | POA: Diagnosis not present

## 2019-06-13 DIAGNOSIS — J3081 Allergic rhinitis due to animal (cat) (dog) hair and dander: Secondary | ICD-10-CM | POA: Diagnosis not present

## 2019-06-13 DIAGNOSIS — J3089 Other allergic rhinitis: Secondary | ICD-10-CM | POA: Diagnosis not present

## 2019-06-21 DIAGNOSIS — J301 Allergic rhinitis due to pollen: Secondary | ICD-10-CM | POA: Diagnosis not present

## 2019-06-21 DIAGNOSIS — J3081 Allergic rhinitis due to animal (cat) (dog) hair and dander: Secondary | ICD-10-CM | POA: Diagnosis not present

## 2019-06-21 DIAGNOSIS — J3089 Other allergic rhinitis: Secondary | ICD-10-CM | POA: Diagnosis not present

## 2019-06-21 DIAGNOSIS — H5211 Myopia, right eye: Secondary | ICD-10-CM | POA: Diagnosis not present

## 2019-07-02 DIAGNOSIS — J301 Allergic rhinitis due to pollen: Secondary | ICD-10-CM | POA: Diagnosis not present

## 2019-07-02 DIAGNOSIS — J3081 Allergic rhinitis due to animal (cat) (dog) hair and dander: Secondary | ICD-10-CM | POA: Diagnosis not present

## 2019-07-02 DIAGNOSIS — J3089 Other allergic rhinitis: Secondary | ICD-10-CM | POA: Diagnosis not present

## 2019-07-11 ENCOUNTER — Ambulatory Visit (INDEPENDENT_AMBULATORY_CARE_PROVIDER_SITE_OTHER): Payer: 59 | Admitting: Pulmonary Disease

## 2019-07-11 ENCOUNTER — Other Ambulatory Visit: Payer: Self-pay

## 2019-07-11 ENCOUNTER — Encounter: Payer: Self-pay | Admitting: Pulmonary Disease

## 2019-07-11 VITALS — BP 124/76 | HR 77 | Temp 98.1°F | Ht 70.0 in | Wt 192.8 lb

## 2019-07-11 DIAGNOSIS — G4733 Obstructive sleep apnea (adult) (pediatric): Secondary | ICD-10-CM | POA: Diagnosis not present

## 2019-07-11 DIAGNOSIS — Z9989 Dependence on other enabling machines and devices: Secondary | ICD-10-CM

## 2019-07-11 DIAGNOSIS — J301 Allergic rhinitis due to pollen: Secondary | ICD-10-CM | POA: Diagnosis not present

## 2019-07-11 DIAGNOSIS — J3089 Other allergic rhinitis: Secondary | ICD-10-CM | POA: Diagnosis not present

## 2019-07-11 DIAGNOSIS — J3081 Allergic rhinitis due to animal (cat) (dog) hair and dander: Secondary | ICD-10-CM | POA: Diagnosis not present

## 2019-07-11 NOTE — Progress Notes (Signed)
[facility]  Chief Complaint  Patient presents with  . Follow-up    pt is wearing cpap machine    Constitutional:  BP 124/76 (BP Location: Left Arm, Cuff Size: Normal)   Pulse 77   Temp 98.1 F (36.7 C) (Oral)   Ht 5\' 10"  (1.778 m)   Wt 192 lb 12.8 oz (87.5 kg)   SpO2 99%   BMI 27.66 kg/m   Past Medical History:  Tremors, HLD, Diverticulosis, Allergic asthma  Brief Summary:  Kenneth Sellers is a 64 y.o. male with obstructive sleep apnea.  Subjective:  He had bronchitis last year.  He had lingering cough until earlier this year.  Followed by Dr. Donneta Romberg.  Gets allergy shots.  Using CPAP nightly.  Has nasal mask with chin strap.  No issues with sinus congestion, sore throat, or dry mouth.  He did get COVID vaccines.   Physical Exam:   Appearance - well kempt   ENMT - no sinus tenderness, no oral exudate, no LAN, Mallampati 2 airway, no stridor  Respiratory - equal breath sounds bilaterally, no wheezing or rales  CV - s1s2 regular rate and rhythm, no murmurs  Ext - no clubbing, no edema  Skin - no rashes  Psych - normal mood and affect  Assessment/Plan:   Obstructive sleep apnea. - he is compliant with CPAP and reports benefit - continue auto CPAP  Allergic asthma. - he is followed by Dr. Mosetta Anis for allergy injections  A total of  21 minutes spent addressing patient care issues on day of visit.   Follow up:   Patient Instructions  Follow up in 1 year   Signature:  Chesley Mires, MD [place] Pager: [phone] 07/11/2019, 2:14 PM  Flow Sheet    Sleep tests:  PSG 08/26/02 >> AHI 59 Auto CPAP 06/10/19 to 07/09/19 >> used on 30 of 30 nights with average 6 hrs 57 min.  Average AHI 7.6 with median CPAP 6 and 95 th percentile CPAP 8 cm H2O.  Medications:   Allergies as of 07/11/2019   No Known Allergies     Medication List       Accurate as of July 11, 2019  2:14 PM. If you have any  questions, ask your nurse or doctor.        STOP taking these medications   ondansetron 4 MG tablet Commonly known as: ZOFRAN Stopped by: Chesley Mires, MD   oxyCODONE 5 MG immediate release tablet Commonly known as: Roxicodone Stopped by: Chesley Mires, MD     TAKE these medications   albuterol 108 (90 Base) MCG/ACT inhaler Commonly known as: Ventolin HFA Inhale 1-2 puffs into the lungs every 6 (six) hours as needed for wheezing or shortness of breath.   aliskiren 150 MG tablet Commonly known as: TEKTURNA TAKE 1 TABLET BY MOUTH DAILY.   AMBULATORY NON FORMULARY MEDICATION CPAP machine nightly   Breo Ellipta 200-25 MCG/INH Aepb Generic drug: fluticasone furoate-vilanterol Inhale 1 puff into the lungs daily.   EPINEPHrine 0.3 mg/0.3 mL Soaj injection Commonly known as: EPI-PEN Inject 0.3 mg into the skin once as needed for anaphylaxis.   ezetimibe-simvastatin 10-20 MG tablet Commonly known as: VYTORIN TAKE 1 TABLET BY MOUTH AT BEDTIME   fluticasone 50 MCG/ACT nasal spray Commonly known as: FLONASE Place 2 sprays into both nostrils daily. What changed:   when to take this  reasons to take this   levocetirizine 5 MG tablet Commonly known as:  XYZAL Take 1 tablet (5 mg total) by mouth daily as needed. What changed: when to take this   montelukast 10 MG tablet Commonly known as: SINGULAIR Take 1 tablet (10 mg total) by mouth at bedtime.   MULTIVITAMIN ADULTS PO Take 1 tablet by mouth daily.   Restora Caps TAKE 1 CAPSULE BY MOUTH DAILY.       Past Surgical History:  He  has a past surgical history that includes Rhinoplasty (1983); lesion removed L neck; pigmented mole at hairline, non melanoma (07/2003); colon tics (03/2005); Nissen fundoplication (0000000); Colonoscopy (03/2005); and Polypectomy.  Family History:  His family history includes Allergies in his sister; Arthritis in his sister; COPD in his brother; Diabetes in his brother, mother, and sister; Heart  attack (age of onset: 60) in his brother; Heart attack (age of onset: 15) in his father; Heart disease in his brother; Heart failure in his mother; Hypertension in his brother and mother; Stroke (age of onset: 15) in his mother.  Social History:  He  reports that he has never smoked. He has never used smokeless tobacco. He reports current alcohol use of about 2.0 standard drinks of alcohol per week. He reports that he does not use drugs.

## 2019-07-11 NOTE — Patient Instructions (Signed)
Follow up in 1 year.

## 2019-07-19 DIAGNOSIS — J301 Allergic rhinitis due to pollen: Secondary | ICD-10-CM | POA: Diagnosis not present

## 2019-07-19 DIAGNOSIS — J3081 Allergic rhinitis due to animal (cat) (dog) hair and dander: Secondary | ICD-10-CM | POA: Diagnosis not present

## 2019-07-19 DIAGNOSIS — J3089 Other allergic rhinitis: Secondary | ICD-10-CM | POA: Diagnosis not present

## 2019-07-26 MED FILL — BREO ELLIPTA 200-25 MCG INH: 200-25 | 30 days supply | Qty: 60 | Fill #1

## 2019-07-30 DIAGNOSIS — J4540 Moderate persistent asthma, uncomplicated: Secondary | ICD-10-CM | POA: Diagnosis not present

## 2019-07-30 DIAGNOSIS — J3081 Allergic rhinitis due to animal (cat) (dog) hair and dander: Secondary | ICD-10-CM | POA: Diagnosis not present

## 2019-07-30 DIAGNOSIS — J301 Allergic rhinitis due to pollen: Secondary | ICD-10-CM | POA: Diagnosis not present

## 2019-07-30 DIAGNOSIS — J3089 Other allergic rhinitis: Secondary | ICD-10-CM | POA: Diagnosis not present

## 2019-08-07 DIAGNOSIS — G4733 Obstructive sleep apnea (adult) (pediatric): Secondary | ICD-10-CM | POA: Diagnosis not present

## 2019-08-08 MED FILL — MONTELUKAST SOD 10 MG TAB: 10 | 90 days supply | Qty: 90 | Fill #0

## 2019-08-08 MED FILL — LEVOCETIRIZINE 5 MG TABLET: 5 | 90 days supply | Qty: 90 | Fill #0

## 2019-08-08 MED FILL — ALISKIREN FUMARATE 150 MG T: 150 | 90 days supply | Qty: 90 | Fill #1

## 2019-08-16 DIAGNOSIS — J3089 Other allergic rhinitis: Secondary | ICD-10-CM | POA: Diagnosis not present

## 2019-08-16 DIAGNOSIS — J3081 Allergic rhinitis due to animal (cat) (dog) hair and dander: Secondary | ICD-10-CM | POA: Diagnosis not present

## 2019-08-16 DIAGNOSIS — J301 Allergic rhinitis due to pollen: Secondary | ICD-10-CM | POA: Diagnosis not present

## 2019-08-21 DIAGNOSIS — J3081 Allergic rhinitis due to animal (cat) (dog) hair and dander: Secondary | ICD-10-CM | POA: Diagnosis not present

## 2019-08-21 DIAGNOSIS — J3089 Other allergic rhinitis: Secondary | ICD-10-CM | POA: Diagnosis not present

## 2019-08-30 DIAGNOSIS — J3081 Allergic rhinitis due to animal (cat) (dog) hair and dander: Secondary | ICD-10-CM | POA: Diagnosis not present

## 2019-08-30 DIAGNOSIS — J3089 Other allergic rhinitis: Secondary | ICD-10-CM | POA: Diagnosis not present

## 2019-08-30 DIAGNOSIS — J301 Allergic rhinitis due to pollen: Secondary | ICD-10-CM | POA: Diagnosis not present

## 2019-09-03 ENCOUNTER — Other Ambulatory Visit (HOSPITAL_COMMUNITY): Payer: Self-pay | Admitting: Allergy

## 2019-09-03 MED FILL — BREO ELLIPTA 200-25 MCG INH: 200-25 | 30 days supply | Qty: 60 | Fill #0

## 2019-09-13 ENCOUNTER — Other Ambulatory Visit: Payer: Self-pay | Admitting: Internal Medicine

## 2019-09-13 DIAGNOSIS — E785 Hyperlipidemia, unspecified: Secondary | ICD-10-CM

## 2019-09-13 DIAGNOSIS — J301 Allergic rhinitis due to pollen: Secondary | ICD-10-CM | POA: Diagnosis not present

## 2019-09-13 DIAGNOSIS — J3089 Other allergic rhinitis: Secondary | ICD-10-CM | POA: Diagnosis not present

## 2019-09-13 DIAGNOSIS — J3081 Allergic rhinitis due to animal (cat) (dog) hair and dander: Secondary | ICD-10-CM | POA: Diagnosis not present

## 2019-09-13 MED FILL — EZETIMIBE-SIMVASTATIN 10-20: 10-20 | 90 days supply | Qty: 90 | Fill #0

## 2019-09-26 ENCOUNTER — Telehealth: Payer: Self-pay | Admitting: Internal Medicine

## 2019-09-26 NOTE — Telephone Encounter (Signed)
New Message:   Pt states he is having swimmer's ear and would like to know if something can be sent to the pharmacy for him. Please advise.   [place], [place]

## 2019-09-27 ENCOUNTER — Ambulatory Visit (INDEPENDENT_AMBULATORY_CARE_PROVIDER_SITE_OTHER): Payer: 59 | Admitting: Internal Medicine

## 2019-09-27 ENCOUNTER — Other Ambulatory Visit: Payer: Self-pay

## 2019-09-27 ENCOUNTER — Encounter: Payer: Self-pay | Admitting: Internal Medicine

## 2019-09-27 ENCOUNTER — Other Ambulatory Visit: Payer: Self-pay | Admitting: Internal Medicine

## 2019-09-27 VITALS — BP 152/90 | HR 68 | Temp 98.2°F | Resp 16 | Ht 70.0 in | Wt 200.0 lb

## 2019-09-27 DIAGNOSIS — H60331 Swimmer's ear, right ear: Secondary | ICD-10-CM

## 2019-09-27 DIAGNOSIS — G250 Essential tremor: Secondary | ICD-10-CM

## 2019-09-27 DIAGNOSIS — J3089 Other allergic rhinitis: Secondary | ICD-10-CM | POA: Diagnosis not present

## 2019-09-27 DIAGNOSIS — H6121 Impacted cerumen, right ear: Secondary | ICD-10-CM | POA: Diagnosis not present

## 2019-09-27 DIAGNOSIS — L280 Lichen simplex chronicus: Secondary | ICD-10-CM | POA: Diagnosis not present

## 2019-09-27 DIAGNOSIS — J301 Allergic rhinitis due to pollen: Secondary | ICD-10-CM | POA: Diagnosis not present

## 2019-09-27 DIAGNOSIS — J3081 Allergic rhinitis due to animal (cat) (dog) hair and dander: Secondary | ICD-10-CM | POA: Diagnosis not present

## 2019-09-27 MED ORDER — GABAPENTIN 100 MG PO CAPS: 100.0000 mg | ORAL_CAPSULE | Freq: Three times a day (TID) | ORAL | 3 refills | Status: DC

## 2019-09-27 MED ORDER — CIPRO HC 0.2-1 % OT SUSP: 3.0000 [drp] | Freq: Two times a day (BID) | OTIC | 0 refills | Status: DC

## 2019-09-27 MED ORDER — NEOMYCIN-POLYMYXIN-HC 1 % OT SOLN: 3.0000 [drp] | Freq: Four times a day (QID) | OTIC | 0 refills | Status: DC

## 2019-09-27 MED FILL — GABAPENTIN 100 MG CAPSULE: 100 | 30 days supply | Qty: 90 | Fill #0

## 2019-09-27 MED FILL — NEO/POLYMYXIN/HC EAR SOLN: 3.5-10000-1 | 16 days supply | Qty: 10 | Fill #0

## 2019-09-27 NOTE — Progress Notes (Signed)
Subjective:  Patient ID: Kenneth Sellers, male    DOB: [birth year]  Age: 64 y.o. MRN: [id number]  CC: Rash and Otalgia  This visit occurred during the SARS-CoV-2 public health emergency.  Safety protocols were in place, including screening questions prior to the visit, additional usage of staff PPE, and extensive cleaning of exam room while observing appropriate contact time as indicated for disinfecting solutions.    HPI Kenneth Sellers presents for f/up.  1.  He complains of tremor and says that he has a compulsion to scratch his forearms.  Both of these conditions are chronic.  He has done some research on gabapentin and he thinks it may help with both of these conditions and he would like to try it.  2.  He complains of a 1 week history of right ear pain and decrease in level of hearing in the right ear.  Outpatient Medications Prior to Visit  Medication Sig Dispense Refill  . albuterol (VENTOLIN HFA) 108 (90 Base) MCG/ACT inhaler Inhale 1-2 puffs into the lungs every 6 (six) hours as needed for wheezing or shortness of breath. 18 g 5  . aliskiren (TEKTURNA) 150 MG tablet TAKE 1 TABLET BY MOUTH DAILY. 90 tablet 1  . AMBULATORY NON FORMULARY MEDICATION CPAP machine nightly    . ezetimibe-simvastatin (VYTORIN) 10-20 MG tablet TAKE 1 TABLET BY MOUTH EVERY NIGHT AT BEDTIME 90 tablet 1  . fluticasone (FLONASE) 50 MCG/ACT nasal spray Place 2 sprays into both nostrils daily. (Patient taking differently: Place 2 sprays into both nostrils daily as needed for allergies. ) 48 g 1  . fluticasone furoate-vilanterol (BREO ELLIPTA) 200-25 MCG/INH AEPB Inhale 1 puff into the lungs daily.    Marland Kitchen levocetirizine (XYZAL) 5 MG tablet Take 1 tablet (5 mg total) by mouth daily as needed. (Patient taking differently: Take 5 mg by mouth every evening. ) 90 tablet 1  . montelukast (SINGULAIR) 10 MG tablet Take 1 tablet (10 mg total) by mouth at bedtime. 90 tablet 1  . Multiple Vitamins-Minerals (MULTIVITAMIN ADULTS PO)  Take 1 tablet by mouth daily.     . Probiotic Product (RESTORA) CAPS TAKE 1 CAPSULE BY MOUTH DAILY. 90 capsule 1  . EPINEPHrine 0.3 mg/0.3 mL IJ SOAJ injection Inject 0.3 mg into the skin once as needed for anaphylaxis. (Patient not taking: Reported on 09/27/2019)     No facility-administered medications prior to visit.    ROS Review of Systems  Constitutional: Negative for appetite change, chills, fatigue and fever.  HENT: Positive for ear pain. Negative for congestion, ear discharge, facial swelling, rhinorrhea, sinus pressure, sore throat and trouble swallowing.   Eyes: Negative.   Respiratory: Negative for chest tightness, shortness of breath and wheezing.   Cardiovascular: Negative for chest pain, palpitations and leg swelling.  Gastrointestinal: Negative for abdominal pain, constipation, diarrhea, nausea and vomiting.  Endocrine: Negative.   Genitourinary: Negative.  Negative for difficulty urinating.  Musculoskeletal: Negative.  Negative for arthralgias and myalgias.  Skin: Positive for rash.  Neurological: Positive for tremors. Negative for dizziness, weakness, light-headedness and numbness.  Hematological: Negative for adenopathy. Does not bruise/bleed easily.  Psychiatric/Behavioral: Negative.     Objective:  BP (!) 152/90 (BP Location: Left Arm, Patient Position: Sitting, Cuff Size: Normal)   Pulse 68   Temp 98.2 F (36.8 C) (Oral)   Resp 16   Ht 5\' 10"  (1.778 m)   Wt 200 lb (90.7 kg)   SpO2 98%   BMI 28.70 kg/m   BP  Readings from Last 3 Encounters:  09/27/19 (!) 152/90  07/11/19 124/76  03/26/19 124/75    Wt Readings from Last 3 Encounters:  09/27/19 200 lb (90.7 kg)  07/11/19 192 lb 12.8 oz (87.5 kg)  03/26/19 195 lb (88.5 kg)    Physical Exam Vitals reviewed.  Constitutional:      Appearance: Normal appearance.  HENT:     Right Ear: Tympanic membrane and external ear normal. Decreased hearing noted. There is impacted cerumen. No foreign body.      Left Ear: Hearing, tympanic membrane, ear canal and external ear normal.     Ears:     Comments: I put Colace in his right EAC and then I irrigated it using an ear pick and water.  The cerumen was removed.  He tolerated this well.  The examination afterwards shows the TM is normal but the inferior floor of the EAC is mildly erythematous and swollen.  There is no exudate. Neurological:     Mental Status: He is alert.     Lab Results  Component Value Date   WBC 6.6 03/26/2019   HGB 17.3 (H) 03/26/2019   HCT 51.0 03/26/2019   PLT 219 03/26/2019   GLUCOSE 127 (H) 03/26/2019   CHOL 177 12/28/2018   TRIG 84.0 12/28/2018   HDL 54.80 12/28/2018   LDLCALC 105 (H) 12/28/2018   ALT 43 03/26/2019   AST 39 03/26/2019   NA 139 03/26/2019   K 4.0 03/26/2019   CL 101 03/26/2019   CREATININE 1.10 03/26/2019   BUN 15 03/26/2019   CO2 25 03/26/2019   TSH 2.24 12/28/2018   TSH 2.31 12/28/2018   PSA 0.32 12/28/2018   HGBA1C 5.8 09/18/2014    CT ABDOMEN PELVIS W CONTRAST  Result Date: 03/26/2019 CLINICAL DATA:  Abdominal pain and distension.  Dysuria. EXAM: CT ABDOMEN AND PELVIS WITH CONTRAST TECHNIQUE: Multidetector CT imaging of the abdomen and pelvis was performed using the standard protocol following bolus administration of intravenous contrast. CONTRAST:  153mL OMNIPAQUE IOHEXOL 300 MG/ML  SOLN COMPARISON:  October 27, 2015. FINDINGS: Lower chest: There is slight atelectatic change in the left base. No lung base edema or consolidation. Hepatobiliary: There is borderline hepatic steatosis. No focal liver lesions are evident. The gallbladder wall is not appreciably thickened. There is no biliary duct dilatation. Pancreas: There is no pancreatic mass or inflammatory focus. Spleen: No splenic lesions are evident. Adrenals/Urinary Tract: Adrenals bilaterally appear normal. There is a cyst arising from the medial upper pole of the left kidney measuring 1.8 x 1.2 cm. There is slight hydronephrosis on  the left. There is no hydronephrosis on the right. There is no intrarenal calculus on either side. There is a 3 x 3 mm calculus at the left ureterovesical junction. No other ureteral calculi are evident. Urinary bladder is midline with wall thickness within normal limits. Stomach/Bowel: There are surgical clips in the left upper abdomen near the stomach consistent with previous Nissen fundoplication type procedure. There is no bowel wall thickening or fluid in this area of postoperative change. Elsewhere, there is no appreciable bowel wall or mesenteric thickening. There are occasional sigmoid diverticula without diverticulitis. There is no evident bowel obstruction. The terminal ileum appears normal. There is no evident free air or portal venous air. Vascular/Lymphatic: No abdominal aortic aneurysm. There is atherosclerotic calcification in the aorta and common iliac arteries. Major venous structures appear patent. There is no evident adenopathy in the abdomen or pelvis. Reproductive: There is minimal prostatic calcification.  Prostate and seminal vesicles are normal in size and contour. No evident pelvic mass. Other: The appendix appears unremarkable. No evident abscess or ascites in the abdomen or pelvis. There is a small ventral hernia immediately to the right of midline in the mid abdominal region containing fat but no bowel. There is muscle thinning at the umbilicus anteriorly. Musculoskeletal: Slight anterior wedging at L2 is stable. Scattered areas of degenerative change in the lumbar and lower thoracic spine. No blastic or lytic bone lesions. No intramuscular lesion evident. IMPRESSION: 1. 3 x 3 mm calculus at the left ureterovesical junction with mild hydronephrosis on the left. 2. Postoperative change in the perigastric region. No inflammatory focus in this area. No bowel obstruction. No abscess in the abdomen or pelvis. Appendix appears normal. There are occasional sigmoid diverticula without  diverticulitis. 3.  Borderline hepatic steatosis. 4. Ventral hernia immediately to the right of midline in the mid abdomen containing fat but no bowel. Electronically Signed   By: Lowella Grip III M.D.   On: 03/26/2019 13:24   DG Abdomen Acute W/Chest  Result Date: 03/26/2019 CLINICAL DATA:  Left lower quadrant pain EXAM: DG ABDOMEN ACUTE W/ 1V CHEST COMPARISON:  None. FINDINGS: Cardiac shadow is within normal limits. The lungs are well aerated bilaterally. No acute bony abnormality is seen. Scattered large and small bowel gas is noted. No free air is seen. Postsurgical changes are noted in the left upper quadrant. No abnormal mass or abnormal calcifications are noted. No acute bony abnormality is seen. Mild degenerative changes of the lumbar spine are noted. IMPRESSION: No free air is seen. No acute abnormality in the chest and abdomen. Electronically Signed   By: Inez Catalina M.D.   On: 03/26/2019 12:56    Assessment & Plan:   Bright was seen today for rash and otalgia.  Diagnoses and all orders for this visit:  Neurodermatitis, localized -     gabapentin (NEURONTIN) 100 MG capsule; Take 1 capsule (100 mg total) by mouth 3 (three) times daily.  Benign essential tremor -     gabapentin (NEURONTIN) 100 MG capsule; Take 1 capsule (100 mg total) by mouth 3 (three) times daily.  Hearing loss due to cerumen impaction, right- His hearing returned to normal after the cerumen was removed.  Acute swimmer's ear of right side -     Discontinue: ciprofloxacin-hydrocortisone (CIPRO HC) OTIC suspension; Place 3 drops into the right ear 2 (two) times daily. -     NEOMYCIN-POLYMYXIN-HYDROCORTISONE (CORTISPORIN) 1 % SOLN OTIC solution; Place 3 drops into the right ear every 6 (six) hours.   I have discontinued Ayson L. Majchrzak's Cipro HC. I am also having him start on gabapentin and NEOMYCIN-POLYMYXIN-HYDROCORTISONE. Additionally, I am having him maintain his AMBULATORY NON FORMULARY MEDICATION,  fluticasone furoate-vilanterol, fluticasone, levocetirizine, montelukast, albuterol, Multiple Vitamins-Minerals (MULTIVITAMIN ADULTS PO), Restora, EPINEPHrine, aliskiren, and ezetimibe-simvastatin.  Meds ordered this encounter  Medications  . gabapentin (NEURONTIN) 100 MG capsule    Sig: Take 1 capsule (100 mg total) by mouth 3 (three) times daily.    Dispense:  90 capsule    Refill:  3  . DISCONTD: ciprofloxacin-hydrocortisone (CIPRO HC) OTIC suspension    Sig: Place 3 drops into the right ear 2 (two) times daily.    Dispense:  10 mL    Refill:  0  . NEOMYCIN-POLYMYXIN-HYDROCORTISONE (CORTISPORIN) 1 % SOLN OTIC solution    Sig: Place 3 drops into the right ear every 6 (six) hours.    Dispense:  10  mL    Refill:  0     Follow-up: Return in about 3 months (around 12/28/2019).  Scarlette Calico, MD

## 2019-09-27 NOTE — Patient Instructions (Signed)
Neurodermatitis Neurodermatitis is an inflammation and thickening of the skin. It is caused by severe itchiness, which leads to repeated scratching and rubbing of the skin. It can happen anywhere on the body. Common places include the neck, head, arms, and legs. Neurodermatitis may have mental or emotional (psychogenic) causes that may need treatment. Usually, this is a lifelong (chronic) condition, but in some cases it may go away on its own. What are the causes? This condition is caused by repeated scratching and rubbing of the skin. This happens because of feelings of severe itchiness. Often, the cause of itchiness is not known. Common causes include:  Materials or substances that irritate the skin.  Skin conditions, such as chronic dermatitis or eczema.  Allergic reaction. In some cases, neurodermatitis may have psychogenic causes, like anxiety or stress, that may need to be treated. What increases the risk? You are more likely to develop this condition if:  You have dry skin or other skin conditions.  You have an anxiety disorder or a lot of stress.  You are 84-54 years old.  You are a woman.  You are exposed to irritating chemicals. What are the signs or symptoms? The main symptom of this condition is one or more patches of skin that are red, swollen, itchy, and thicker than normal.  These patches can be anywhere on the body, but are often on the head, neck, legs, and arms.  This condition can also affect the genital and anal areas.  In severe cases, bleeding, crusting, and scaling of the skin can occur. Symptoms often come and go over time. The frequency and severity of symptoms varies from person to person. How is this diagnosed? This condition is diagnosed based on your medical history and a physical exam of your skin. You may be referred to a health care provider who specializes in skin care (dermatologist). You may also have tests, including:  Skin allergy tests. These  tests will determine if you have an allergy that is causing your condition.  Blood or other lab tests. These tests can help to determine if your itching is caused by an infection or other condition. In some cases, your health care provider may remove a small amount of skin cells to be examined under a microscope (biopsy). How is this treated? This condition is managed by stopping all scratching and rubbing of the affected area. It is also important to remove all skin irritants and treat any underlying causes of neurodermatitis. Depending on the cause, your health care provider may recommend certain treatments such as:  Creams, lotions, or pills to reduce inflammation and itching (corticosteroids).  Medicines to prevent or treat infection (antibiotics).  Medicines to relieve allergy symptoms (antihistamines).  Therapy to learn how to stop feelings of itchiness and stop scratching (behavioral therapy or psychotherapy). Your health care provider may recommend a doctor who specializes in human behavior (psychologist).  Phototherapy. This condition sometimes goes away without treatment. Follow these instructions at home: Skin care   Avoid scratching and rubbing your skin. This is the best way to manage neurodermatitis and prevent it from returning.  Keep your skin clean and moisturized. ? Avoid very hot water. ? Apply lotion at least one time per day. ? Avoid products, such as soaps and lotions, that have harsh chemicals, scents, and dyes. ? Try to shower and take baths only as often as you need to. Frequent bathing can dry out your skin.  Avoid tight or rough clothing that irritates your skin.  Apply cool washcloths to your skin to help reduce itching. General instructions  Use over-the-counter and prescription medicines only as told by your health care provider.  Drink enough fluid to keep your urine pale yellow.  Avoid irritating chemicals.  Pay attention to your symptoms. Watch  for things that trigger itching and scratching.  Keep your fingernails cut short to reduce injury from scratching.  Keep all follow-up visits as told by your health care provider. This is important. Contact a health care provider if:  Your condition gets worse or does not get better after 3-4 days of treatment.  You have blood or fluid leaking from an irritated patch of skin.  You have a fever. Summary  Neurodermatitis is an inflammation and thickening of the skin. It is caused by severe itchiness, which leads to repeated scratching and rubbing of the skin.  Depending on the cause, your health care provider may recommend certain treatments such as creams, lotions, and pills to reduce inflammation and other medicines to treat infection, if needed.  Treatment may also include therapy to learn how to stop feelings of itchiness and stop scratching (behavioral therapy or psychotherapy).  Use over-the-counter and prescription medicines only as told by your health care provider.  Keep all follow-up visits as told by your health care provider. This is important. This information is not intended to replace advice given to you by your health care provider. Make sure you discuss any questions you have with your health care provider. Document Revised: 05/19/2018 Document Reviewed: 06/15/2017 Elsevier Patient Education  [employer].

## 2019-10-04 DIAGNOSIS — J3089 Other allergic rhinitis: Secondary | ICD-10-CM | POA: Diagnosis not present

## 2019-10-04 DIAGNOSIS — J301 Allergic rhinitis due to pollen: Secondary | ICD-10-CM | POA: Diagnosis not present

## 2019-10-04 DIAGNOSIS — J3081 Allergic rhinitis due to animal (cat) (dog) hair and dander: Secondary | ICD-10-CM | POA: Diagnosis not present

## 2019-10-11 DIAGNOSIS — J3081 Allergic rhinitis due to animal (cat) (dog) hair and dander: Secondary | ICD-10-CM | POA: Diagnosis not present

## 2019-10-11 DIAGNOSIS — J301 Allergic rhinitis due to pollen: Secondary | ICD-10-CM | POA: Diagnosis not present

## 2019-10-11 DIAGNOSIS — J3089 Other allergic rhinitis: Secondary | ICD-10-CM | POA: Diagnosis not present

## 2019-10-16 MED FILL — BREO ELLIPTA 200-25 MCG INH: 200-25 | 30 days supply | Qty: 60 | Fill #1

## 2019-10-18 DIAGNOSIS — J3089 Other allergic rhinitis: Secondary | ICD-10-CM | POA: Diagnosis not present

## 2019-10-18 DIAGNOSIS — J301 Allergic rhinitis due to pollen: Secondary | ICD-10-CM | POA: Diagnosis not present

## 2019-10-18 DIAGNOSIS — J3081 Allergic rhinitis due to animal (cat) (dog) hair and dander: Secondary | ICD-10-CM | POA: Diagnosis not present

## 2019-10-25 DIAGNOSIS — J3081 Allergic rhinitis due to animal (cat) (dog) hair and dander: Secondary | ICD-10-CM | POA: Diagnosis not present

## 2019-10-25 DIAGNOSIS — J301 Allergic rhinitis due to pollen: Secondary | ICD-10-CM | POA: Diagnosis not present

## 2019-10-25 DIAGNOSIS — J3089 Other allergic rhinitis: Secondary | ICD-10-CM | POA: Diagnosis not present

## 2019-11-01 DIAGNOSIS — J3089 Other allergic rhinitis: Secondary | ICD-10-CM | POA: Diagnosis not present

## 2019-11-01 DIAGNOSIS — J3081 Allergic rhinitis due to animal (cat) (dog) hair and dander: Secondary | ICD-10-CM | POA: Diagnosis not present

## 2019-11-01 DIAGNOSIS — J301 Allergic rhinitis due to pollen: Secondary | ICD-10-CM | POA: Diagnosis not present

## 2019-11-05 ENCOUNTER — Other Ambulatory Visit: Payer: Self-pay | Admitting: Internal Medicine

## 2019-11-05 DIAGNOSIS — I10 Essential (primary) hypertension: Secondary | ICD-10-CM

## 2019-11-05 MED FILL — MONTELUKAST SOD 10 MG TAB: 10 | 90 days supply | Qty: 90 | Fill #1

## 2019-11-05 MED FILL — LEVOCETIRIZINE 5 MG TABLET: 5 | 90 days supply | Qty: 90 | Fill #1

## 2019-11-05 MED FILL — GABAPENTIN 100 MG CAPSULE: 100 | 30 days supply | Qty: 90 | Fill #1

## 2019-11-05 MED FILL — ALISKIREN FUMARATE 150 MG T: 150 | 90 days supply | Qty: 90 | Fill #0

## 2019-11-08 DIAGNOSIS — J301 Allergic rhinitis due to pollen: Secondary | ICD-10-CM | POA: Diagnosis not present

## 2019-11-08 DIAGNOSIS — J3089 Other allergic rhinitis: Secondary | ICD-10-CM | POA: Diagnosis not present

## 2019-11-08 DIAGNOSIS — J3081 Allergic rhinitis due to animal (cat) (dog) hair and dander: Secondary | ICD-10-CM | POA: Diagnosis not present

## 2019-11-13 ENCOUNTER — Ambulatory Visit: Payer: 59 | Attending: Internal Medicine

## 2019-11-13 DIAGNOSIS — Z23 Encounter for immunization: Secondary | ICD-10-CM

## 2019-11-13 NOTE — Progress Notes (Signed)
   Covid-[facility] Vaccination [facility]  Name:  Kenneth Sellers    MRN: [id number] DOB: 1955-03-29  11/13/2019  Kenneth Sellers was observed post Covid-19 immunization for 15 minutes without incident. He was provided with Vaccine Information Sheet and instruction to access the V-Safe system.   Kenneth Sellers was instructed to call 911 with any severe reactions post vaccine: Marland Kitchen Difficulty breathing  . Swelling of face and throat  . A fast heartbeat  . A bad rash all over body  . Dizziness and weakness

## 2019-11-22 ENCOUNTER — Other Ambulatory Visit (HOSPITAL_COMMUNITY): Payer: Self-pay | Admitting: Internal Medicine

## 2019-11-22 MED FILL — BREO ELLIPTA 200-25 MCG INH: 200-25 | 30 days supply | Qty: 60 | Fill #2

## 2019-11-22 MED FILL — FLUAD QUADRIVALENT 0.5 ML P: 0.5 | 1 days supply | Qty: 1 | Fill #0

## 2019-11-26 DIAGNOSIS — J3089 Other allergic rhinitis: Secondary | ICD-10-CM | POA: Diagnosis not present

## 2019-11-26 DIAGNOSIS — J3081 Allergic rhinitis due to animal (cat) (dog) hair and dander: Secondary | ICD-10-CM | POA: Diagnosis not present

## 2019-11-26 DIAGNOSIS — J301 Allergic rhinitis due to pollen: Secondary | ICD-10-CM | POA: Diagnosis not present

## 2019-12-07 DIAGNOSIS — G4733 Obstructive sleep apnea (adult) (pediatric): Secondary | ICD-10-CM | POA: Diagnosis not present

## 2019-12-18 MED FILL — BREO ELLIPTA 200-25 MCG INH: 200-25 | 30 days supply | Qty: 60 | Fill #3

## 2019-12-18 MED FILL — EZETIMIBE-SIMVASTATIN 10-20: 10-20 | 90 days supply | Qty: 90 | Fill #1

## 2019-12-19 DIAGNOSIS — J3081 Allergic rhinitis due to animal (cat) (dog) hair and dander: Secondary | ICD-10-CM | POA: Diagnosis not present

## 2019-12-19 DIAGNOSIS — J301 Allergic rhinitis due to pollen: Secondary | ICD-10-CM | POA: Diagnosis not present

## 2019-12-19 DIAGNOSIS — J3089 Other allergic rhinitis: Secondary | ICD-10-CM | POA: Diagnosis not present

## 2019-12-26 DIAGNOSIS — J3089 Other allergic rhinitis: Secondary | ICD-10-CM | POA: Diagnosis not present

## 2019-12-26 DIAGNOSIS — J301 Allergic rhinitis due to pollen: Secondary | ICD-10-CM | POA: Diagnosis not present

## 2019-12-26 DIAGNOSIS — J3081 Allergic rhinitis due to animal (cat) (dog) hair and dander: Secondary | ICD-10-CM | POA: Diagnosis not present

## 2019-12-31 ENCOUNTER — Other Ambulatory Visit: Payer: Self-pay

## 2019-12-31 ENCOUNTER — Ambulatory Visit (INDEPENDENT_AMBULATORY_CARE_PROVIDER_SITE_OTHER): Payer: 59 | Admitting: Internal Medicine

## 2019-12-31 ENCOUNTER — Ambulatory Visit (INDEPENDENT_AMBULATORY_CARE_PROVIDER_SITE_OTHER): Payer: 59

## 2019-12-31 ENCOUNTER — Encounter: Payer: Self-pay | Admitting: Internal Medicine

## 2019-12-31 VITALS — BP 138/84 | HR 78 | Temp 98.4°F | Resp 16 | Ht 70.0 in | Wt 202.0 lb

## 2019-12-31 DIAGNOSIS — J45909 Unspecified asthma, uncomplicated: Secondary | ICD-10-CM | POA: Diagnosis not present

## 2019-12-31 DIAGNOSIS — E785 Hyperlipidemia, unspecified: Secondary | ICD-10-CM

## 2019-12-31 DIAGNOSIS — Z0000 Encounter for general adult medical examination without abnormal findings: Secondary | ICD-10-CM

## 2019-12-31 DIAGNOSIS — I10 Essential (primary) hypertension: Secondary | ICD-10-CM

## 2019-12-31 DIAGNOSIS — R140 Abdominal distension (gaseous): Secondary | ICD-10-CM | POA: Insufficient documentation

## 2019-12-31 DIAGNOSIS — R739 Hyperglycemia, unspecified: Secondary | ICD-10-CM | POA: Insufficient documentation

## 2019-12-31 DIAGNOSIS — Z0001 Encounter for general adult medical examination with abnormal findings: Secondary | ICD-10-CM

## 2019-12-31 LAB — CBC WITH DIFFERENTIAL/PLATELET
Basophils Absolute: 0.1 10*3/uL (ref 0.0–0.1)
Basophils Relative: 1.4 % (ref 0.0–3.0)
Eosinophils Absolute: 0.3 10*3/uL (ref 0.0–0.7)
Eosinophils Relative: 6.1 % — ABNORMAL HIGH (ref 0.0–5.0)
HCT: 47.7 % (ref 39.0–52.0)
Hemoglobin: 16.4 g/dL (ref 13.0–17.0)
Lymphocytes Relative: 23.9 % (ref 12.0–46.0)
Lymphs Abs: 1.3 10*3/uL (ref 0.7–4.0)
MCHC: 34.4 g/dL (ref 30.0–36.0)
MCV: 90.5 fl (ref 78.0–100.0)
Monocytes Absolute: 0.8 10*3/uL (ref 0.1–1.0)
Monocytes Relative: 15.1 % — ABNORMAL HIGH (ref 3.0–12.0)
Neutro Abs: 2.8 10*3/uL (ref 1.4–7.7)
Neutrophils Relative %: 53.5 % (ref 43.0–77.0)
Platelets: 189 10*3/uL (ref 150.0–400.0)
RBC: 5.27 Mil/uL (ref 4.22–5.81)
RDW: 13.2 % (ref 11.5–15.5)
WBC: 5.3 10*3/uL (ref 4.0–10.5)

## 2019-12-31 LAB — HEPATIC FUNCTION PANEL
ALT: 21 U/L (ref 0–53)
AST: 20 U/L (ref 0–37)
Albumin: 4.5 g/dL (ref 3.5–5.2)
Alkaline Phosphatase: 56 U/L (ref 39–117)
Bilirubin, Direct: 0.1 mg/dL (ref 0.0–0.3)
Total Bilirubin: 0.7 mg/dL (ref 0.2–1.2)
Total Protein: 6.6 g/dL (ref 6.0–8.3)

## 2019-12-31 LAB — LIPID PANEL
Cholesterol: 158 mg/dL (ref 0–200)
HDL: 52.4 mg/dL (ref >39.00)
LDL Cholesterol: 77 mg/dL (ref 0–99)
NonHDL: 105.25
Total CHOL/HDL Ratio: 3
Triglycerides: 141 mg/dL (ref 0.0–149.0)
VLDL: 28.2 mg/dL (ref 0.0–40.0)

## 2019-12-31 LAB — HEMOGLOBIN A1C: Hgb A1c MFr Bld: 5.7 % (ref 4.6–6.5)

## 2019-12-31 LAB — BASIC METABOLIC PANEL
BUN: 12 mg/dL (ref 6–23)
CO2: 30 mEq/L (ref 19–32)
Calcium: 9.6 mg/dL (ref 8.4–10.5)
Chloride: 101 mEq/L (ref 96–112)
Creatinine, Ser: 1 mg/dL (ref 0.40–1.50)
GFR: 78.58 mL/min (ref >60.00)
Glucose, Bld: 99 mg/dL (ref 70–99)
Potassium: 4 mEq/L (ref 3.5–5.1)
Sodium: 139 mEq/L (ref 135–145)

## 2019-12-31 LAB — TSH: TSH: 3.73 u[IU]/mL (ref 0.35–4.50)

## 2019-12-31 LAB — PSA: PSA: 0.28 ng/mL (ref 0.10–4.00)

## 2019-12-31 MED ORDER — METAMUCIL 28 % PO PACK: 1.0000 | PACK | Freq: Two times a day (BID) | ORAL | 3 refills | Status: DC

## 2019-12-31 NOTE — Progress Notes (Signed)
Subjective:  Patient ID: Kenneth Sellers, male    DOB: 20-Nov-1955  Age: 64 y.o. MRN: [id number]  CC: Annual Exam, Hypertension, Hyperlipidemia, and Asthma  This visit occurred during the SARS-CoV-2 public health emergency.  Safety protocols were in place, including screening questions prior to the visit, additional usage of staff PPE, and extensive cleaning of exam room while observing appropriate contact time as indicated for disinfecting solutions.    HPI Kenneth Sellers presents for a CPX.  He complains of a 19-month history of abdominal bloating and obstipation.  He has several bowel movements a day but does not feel like he completely empties out his colon.  He has gotten some symptom relief with a probiotic and Greek yogurt.  He denies nausea, vomiting, loss of appetite, weight loss, melena, diarrhea, or bright red blood per rectum.  He does not monitor his blood pressure.  He is active and denies any recent episodes of chest pain, shortness of breath, palpitations, dizziness, lightheadedness, or edema.  Outpatient Medications Prior to Visit  Medication Sig Dispense Refill  . albuterol (VENTOLIN HFA) 108 (90 Base) MCG/ACT inhaler Inhale 1-2 puffs into the lungs every 6 (six) hours as needed for wheezing or shortness of breath. 18 g 5  . aliskiren (TEKTURNA) 150 MG tablet TAKE 1 TABLET BY MOUTH DAILY. 90 tablet 1  . AMBULATORY NON FORMULARY MEDICATION CPAP machine nightly    . EPINEPHrine 0.3 mg/0.3 mL IJ SOAJ injection Inject 0.3 mg into the skin once as needed for anaphylaxis.     Marland Kitchen ezetimibe-simvastatin (VYTORIN) 10-20 MG tablet TAKE 1 TABLET BY MOUTH EVERY NIGHT AT BEDTIME 90 tablet 1  . fluticasone (FLONASE) 50 MCG/ACT nasal spray Place 2 sprays into both nostrils daily. (Patient taking differently: Place 2 sprays into both nostrils daily as needed for allergies. ) 48 g 1  . fluticasone furoate-vilanterol (BREO ELLIPTA) 200-25 MCG/INH AEPB Inhale 1 puff into the lungs daily.    Marland Kitchen  gabapentin (NEURONTIN) 100 MG capsule Take 1 capsule (100 mg total) by mouth 3 (three) times daily. 90 capsule 3  . levocetirizine (XYZAL) 5 MG tablet Take 1 tablet (5 mg total) by mouth daily as needed. (Patient taking differently: Take 5 mg by mouth every evening. ) 90 tablet 1  . montelukast (SINGULAIR) 10 MG tablet Take 1 tablet (10 mg total) by mouth at bedtime. 90 tablet 1  . Multiple Vitamins-Minerals (MULTIVITAMIN ADULTS PO) Take 1 tablet by mouth daily.     . NEOMYCIN-POLYMYXIN-HYDROCORTISONE (CORTISPORIN) 1 % SOLN OTIC solution Place 3 drops into the right ear every 6 (six) hours. 10 mL 0  . Probiotic Product (RESTORA) CAPS TAKE 1 CAPSULE BY MOUTH DAILY. 90 capsule 1   No facility-administered medications prior to visit.    ROS Review of Systems  Constitutional: Positive for unexpected weight change (wt gain). Negative for appetite change, chills, diaphoresis, fatigue and fever.  HENT: Negative.   Eyes: Negative.   Respiratory: Negative.  Negative for cough, chest tightness, shortness of breath and wheezing.   Cardiovascular: Negative for chest pain, palpitations and leg swelling.  Gastrointestinal: Positive for abdominal pain. Negative for abdominal distention, blood in stool, constipation, diarrhea, nausea and vomiting.  Endocrine: Negative.   Genitourinary: Negative.  Negative for difficulty urinating, scrotal swelling and testicular pain.  Musculoskeletal: Negative.  Negative for arthralgias and myalgias.  Skin: Negative.   Neurological: Negative.  Negative for dizziness, weakness, light-headedness and numbness.  Hematological: Negative for adenopathy. Does not bruise/bleed easily.  Psychiatric/Behavioral:  Negative.     Objective:  BP 138/84   Pulse 78   Temp 98.4 F (36.9 C) (Oral)   Resp 16   Ht 5\' 10"  (1.778 m)   Wt 202 lb (91.6 kg)   SpO2 97%   BMI 28.98 kg/m   BP Readings from Last 3 Encounters:  12/31/19 138/84  09/27/19 (!) 152/90  07/11/19 124/76     Wt Readings from Last 3 Encounters:  12/31/19 202 lb (91.6 kg)  09/27/19 200 lb (90.7 kg)  07/11/19 192 lb 12.8 oz (87.5 kg)    Physical Exam Vitals reviewed.  Constitutional:      Appearance: Normal appearance.  HENT:     Nose: Nose normal.     Mouth/Throat:     Mouth: Mucous membranes are moist.  Eyes:     General: No scleral icterus.    Conjunctiva/sclera: Conjunctivae normal.  Cardiovascular:     Rate and Rhythm: Normal rate and regular rhythm.     Pulses: Normal pulses.     Heart sounds: No murmur heard.      Comments: EKG- NSR, 73 bpm No LVH Slight decrease voltage in V6 but no Q wave No significant change compared to the prior EKG Pulmonary:     Effort: Pulmonary effort is normal.     Breath sounds: No stridor. No wheezing, rhonchi or rales.  Abdominal:     General: Abdomen is protuberant. Bowel sounds are normal. There is no distension.     Palpations: Abdomen is soft. There is no hepatomegaly, splenomegaly or mass.     Tenderness: There is no abdominal tenderness.     Hernia: There is no hernia in the left inguinal area or right inguinal area.  Genitourinary:    Pubic Area: No rash.      Penis: Normal and uncircumcised. No discharge, swelling or lesions.      Testes: Normal.        Right: Mass, tenderness or swelling not present.        Left: Mass, tenderness or swelling not present.     Epididymis:     Right: Normal. Not inflamed or enlarged. No mass.     Left: Normal. Not inflamed or enlarged. No mass.     Prostate: Normal. Not enlarged, not tender and no nodules present.     Rectum: Normal. Guaiac result negative. No mass, tenderness, anal fissure, external hemorrhoid or internal hemorrhoid. Normal anal tone.  Musculoskeletal:        General: No swelling. Normal range of motion.     Cervical back: Neck supple.     Right lower leg: No edema.     Left lower leg: No edema.  Lymphadenopathy:     Cervical: No cervical adenopathy.     Lower Body: No  right inguinal adenopathy. No left inguinal adenopathy.  Skin:    General: Skin is warm and dry.     Coloration: Skin is not pale.     Findings: No rash.  Neurological:     General: No focal deficit present.     Mental Status: He is alert and oriented to person, place, and time. Mental status is at baseline.  Psychiatric:        Mood and Affect: Mood normal.        Behavior: Behavior normal.     Lab Results  Component Value Date   WBC 5.3 12/31/2019   HGB 16.4 12/31/2019   HCT 47.7 12/31/2019   PLT 189.0 12/31/2019  GLUCOSE 99 12/31/2019   CHOL 158 12/31/2019   TRIG 141.0 12/31/2019   HDL 52.40 12/31/2019   LDLCALC 77 12/31/2019   ALT 21 12/31/2019   AST 20 12/31/2019   NA 139 12/31/2019   K 4.0 12/31/2019   CL 101 12/31/2019   CREATININE 1.00 12/31/2019   BUN 12 12/31/2019   CO2 30 12/31/2019   TSH 3.73 12/31/2019   PSA 0.28 12/31/2019   HGBA1C 5.7 12/31/2019    CT ABDOMEN PELVIS W CONTRAST  Result Date: 03/26/2019 CLINICAL DATA:  Abdominal pain and distension.  Dysuria. EXAM: CT ABDOMEN AND PELVIS WITH CONTRAST TECHNIQUE: Multidetector CT imaging of the abdomen and pelvis was performed using the standard protocol following bolus administration of intravenous contrast. CONTRAST:  152mL OMNIPAQUE IOHEXOL 300 MG/ML  SOLN COMPARISON:  October 27, 2015. FINDINGS: Lower chest: There is slight atelectatic change in the left base. No lung base edema or consolidation. Hepatobiliary: There is borderline hepatic steatosis. No focal liver lesions are evident. The gallbladder wall is not appreciably thickened. There is no biliary duct dilatation. Pancreas: There is no pancreatic mass or inflammatory focus. Spleen: No splenic lesions are evident. Adrenals/Urinary Tract: Adrenals bilaterally appear normal. There is a cyst arising from the medial upper pole of the left kidney measuring 1.8 x 1.2 cm. There is slight hydronephrosis on the left. There is no hydronephrosis on the right.  There is no intrarenal calculus on either side. There is a 3 x 3 mm calculus at the left ureterovesical junction. No other ureteral calculi are evident. Urinary bladder is midline with wall thickness within normal limits. Stomach/Bowel: There are surgical clips in the left upper abdomen near the stomach consistent with previous Nissen fundoplication type procedure. There is no bowel wall thickening or fluid in this area of postoperative change. Elsewhere, there is no appreciable bowel wall or mesenteric thickening. There are occasional sigmoid diverticula without diverticulitis. There is no evident bowel obstruction. The terminal ileum appears normal. There is no evident free air or portal venous air. Vascular/Lymphatic: No abdominal aortic aneurysm. There is atherosclerotic calcification in the aorta and common iliac arteries. Major venous structures appear patent. There is no evident adenopathy in the abdomen or pelvis. Reproductive: There is minimal prostatic calcification. Prostate and seminal vesicles are normal in size and contour. No evident pelvic mass. Other: The appendix appears unremarkable. No evident abscess or ascites in the abdomen or pelvis. There is a small ventral hernia immediately to the right of midline in the mid abdominal region containing fat but no bowel. There is muscle thinning at the umbilicus anteriorly. Musculoskeletal: Slight anterior wedging at L2 is stable. Scattered areas of degenerative change in the lumbar and lower thoracic spine. No blastic or lytic bone lesions. No intramuscular lesion evident. IMPRESSION: 1. 3 x 3 mm calculus at the left ureterovesical junction with mild hydronephrosis on the left. 2. Postoperative change in the perigastric region. No inflammatory focus in this area. No bowel obstruction. No abscess in the abdomen or pelvis. Appendix appears normal. There are occasional sigmoid diverticula without diverticulitis. 3.  Borderline hepatic steatosis. 4. Ventral  hernia immediately to the right of midline in the mid abdomen containing fat but no bowel. Electronically Signed   By: Lowella Grip III M.D.   On: 03/26/2019 13:24   DG Abdomen Acute W/Chest  Result Date: 03/26/2019 CLINICAL DATA:  Left lower quadrant pain EXAM: DG ABDOMEN ACUTE W/ 1V CHEST COMPARISON:  None. FINDINGS: Cardiac shadow is within normal limits. The lungs  are well aerated bilaterally. No acute bony abnormality is seen. Scattered large and small bowel gas is noted. No free air is seen. Postsurgical changes are noted in the left upper quadrant. No abnormal mass or abnormal calcifications are noted. No acute bony abnormality is seen. Mild degenerative changes of the lumbar spine are noted. IMPRESSION: No free air is seen. No acute abnormality in the chest and abdomen. Electronically Signed   By: Inez Catalina M.D.   On: 03/26/2019 12:56    DG ABD ACUTE 2+V W 1V CHEST  Result Date: 12/31/2019 CLINICAL DATA:  Bloating, hypertension, asthma EXAM: DG ABDOMEN ACUTE WITH 1 VIEW CHEST COMPARISON:  03/26/2019 FINDINGS: There is no evidence of dilated bowel loops or free intraperitoneal air. No radiopaque calculi or other significant radiographic abnormality is seen. Heart size and mediastinal contours are within normal limits. Both lungs are clear. IMPRESSION: Negative abdominal radiographs.  No acute cardiopulmonary disease. Electronically Signed   By: Rolm Baptise M.D.   On: 12/31/2019 14:42    Assessment & Plan:   Mikey was seen today for annual exam, hypertension, hyperlipidemia and asthma.  Diagnoses and all orders for this visit:  Encounter for general adult medical examination with abnormal findings- Exam completed, labs reviewed, vaccines reviewed and updated, cancer screenings are up-to-date, patient education was given. -     Lipid panel; Future -     PSA; Future -     PSA -     Lipid panel  Hyperlipidemia with target LDL less than 130- He has achieved his LDL goal and is  doing well on the statin. -     TSH; Future -     Hepatic function panel; Future -     Hepatic function panel -     TSH  White coat syndrome with diagnosis of hypertension - His blood pressure is well controlled.  Electrolytes and renal function are normal. -     Basic metabolic panel; Future -     CBC with Differential/Platelet; Future -     EKG 12-Lead -     CBC with Differential/Platelet -     Basic metabolic panel  Hyperglycemia- His A1c is normal at 5.7%. -     Basic metabolic panel; Future -     Hemoglobin A1c; Future -     Hemoglobin A1c -     Basic metabolic panel  Abdominal bloating- Based on his symptoms, exam, plain x-ray, and labs this is IBS.  I recommended that he try a fiber supplement.  He tells me he will be seeing his gastroenterologist soon. -     DG ABD ACUTE 2+V W 1V CHEST; Future -     psyllium (METAMUCIL) 28 % packet; Take 1 packet by mouth 2 (two) times daily.   I am having Spero L. Roussin start on Metamucil. I am also having him maintain his AMBULATORY NON FORMULARY MEDICATION, fluticasone furoate-vilanterol, fluticasone, levocetirizine, montelukast, albuterol, Multiple Vitamins-Minerals (MULTIVITAMIN ADULTS PO), Restora, EPINEPHrine, ezetimibe-simvastatin, gabapentin, NEOMYCIN-POLYMYXIN-HYDROCORTISONE, and aliskiren.  Meds ordered this encounter  Medications  . psyllium (METAMUCIL) 28 % packet    Sig: Take 1 packet by mouth 2 (two) times daily.    Dispense:  60 packet    Refill:  3   In addition to time spent on CPE, I spent 50 minutes in preparing to see the patient by review of recent labs, imaging and procedures, obtaining and reviewing separately obtained history, communicating with the patient and family or caregiver, ordering medications, tests  or procedures, and documenting clinical information in the EHR including the differential Dx, treatment, and any further evaluation and other management of 1. Hyperlipidemia with target LDL less than 130 2. White  coat syndrome with diagnosis of hypertension 3. Hyperglycemia 4. Abdominal bloating     Follow-up: Return in about 6 months (around 06/29/2020).  Scarlette Calico, MD

## 2019-12-31 NOTE — Patient Instructions (Signed)

## 2020-01-02 DIAGNOSIS — J301 Allergic rhinitis due to pollen: Secondary | ICD-10-CM | POA: Diagnosis not present

## 2020-01-02 DIAGNOSIS — J3089 Other allergic rhinitis: Secondary | ICD-10-CM | POA: Diagnosis not present

## 2020-01-02 DIAGNOSIS — J3081 Allergic rhinitis due to animal (cat) (dog) hair and dander: Secondary | ICD-10-CM | POA: Diagnosis not present

## 2020-01-09 DIAGNOSIS — J3081 Allergic rhinitis due to animal (cat) (dog) hair and dander: Secondary | ICD-10-CM | POA: Diagnosis not present

## 2020-01-09 DIAGNOSIS — J3089 Other allergic rhinitis: Secondary | ICD-10-CM | POA: Diagnosis not present

## 2020-01-09 DIAGNOSIS — J301 Allergic rhinitis due to pollen: Secondary | ICD-10-CM | POA: Diagnosis not present

## 2020-01-10 ENCOUNTER — Encounter: Payer: Self-pay | Admitting: Gastroenterology

## 2020-01-10 ENCOUNTER — Ambulatory Visit (INDEPENDENT_AMBULATORY_CARE_PROVIDER_SITE_OTHER): Payer: 59 | Admitting: Gastroenterology

## 2020-01-10 VITALS — BP 140/70 | HR 76 | Ht 68.25 in | Wt 199.5 lb

## 2020-01-10 DIAGNOSIS — Z860100 Personal history of colon polyps, unspecified: Secondary | ICD-10-CM

## 2020-01-10 DIAGNOSIS — R140 Abdominal distension (gaseous): Secondary | ICD-10-CM | POA: Diagnosis not present

## 2020-01-10 DIAGNOSIS — R194 Change in bowel habit: Secondary | ICD-10-CM

## 2020-01-10 NOTE — Patient Instructions (Signed)
Continue a fiber supplement daily such as citracel clear.   Start a probiotic daily such as Jiles Crocker colon health or culturelle.   You have been given samples of IB gard to take 1-2 twice daily as needed for bloating. This can be purchases over the counter if it helps.   Call our office back in a few months if your symptoms are not better.   Thank you for choosing me and [facility].  Pricilla Riffle. Dagoberto Ligas., MD., [facility]

## 2020-01-10 NOTE — Progress Notes (Signed)
    History of Present Illness: This is a 64 year old male with change in bowel habits.  He relates in September he noted difficulty evacuating stool with softer stools, less formed stools, straining, intermittent abdominal bloating and more frequent bowel movements.  He started gabapentin a few weeks before the bowel habit change so he discontinued gabapentin with no impact on symptoms.  Since last week his bowel habits have returned to more normal.  He does not relate a change in diet, antibiotic use or other new medications.  Recent colonoscopy as below.  Colonoscopy 12/2018 - Two 5 to 6 mm polyps in the descending colon, removed with a cold snare. Resected and retrieved. (tubular adenomas) - Mild diverticulosis in the left colon. - Internal hemorrhoids. - The examination was otherwise normal on direct and retroflexion views.   Current Medications, Allergies, Past Medical History, Past Surgical History, Family History and Social History were reviewed in [employer] record.   Physical Exam: General: Well developed, well nourished, no acute distress Head: Normocephalic and atraumatic Eyes:  sclerae anicteric, EOMI Ears: Normal auditory acuity Mouth: Not examined, mask on during Covid-19 pandemic Lungs: Clear throughout to auscultation Heart: Regular rate and rhythm; no murmurs, rubs or bruits Abdomen: Soft, non tender and non distended. No masses, hepatosplenomegaly or hernias noted. Normal Bowel sounds Rectal: Not done Musculoskeletal: Symmetrical with no gross deformities  Pulses:  Normal pulses noted Extremities: No clubbing, cyanosis, edema or deformities noted Neurological: Alert oriented x 4, grossly nonfocal Psychological:  Alert and cooperative. Normal mood and affect   Assessment and Recommendations:  1. Change in bowel habits, intermittent abdominal bloating.  He has just started Metamucil fiber supplements qd and I've advised him to continue this.   If he prefers he can try Citrucel Clear daily.  Advised to drink at least 6 glasses of water daily.  IBgard 1-2 p.o. 3 times daily as needed abdominal bloating, abdominal pain.  Trial of several different probiotics as outlined in his discharge instructions.  If symptoms persist after 6 to 8 weeks he is advised to call for further evaluation and advice.  2. Personal history of adenomatous colon polyps. Surveillance colonoscopy recommended in 12/2023.

## 2020-01-16 DIAGNOSIS — J301 Allergic rhinitis due to pollen: Secondary | ICD-10-CM | POA: Diagnosis not present

## 2020-01-16 DIAGNOSIS — J3081 Allergic rhinitis due to animal (cat) (dog) hair and dander: Secondary | ICD-10-CM | POA: Diagnosis not present

## 2020-01-16 DIAGNOSIS — J3089 Other allergic rhinitis: Secondary | ICD-10-CM | POA: Diagnosis not present

## 2020-01-23 DIAGNOSIS — J3089 Other allergic rhinitis: Secondary | ICD-10-CM | POA: Diagnosis not present

## 2020-01-23 DIAGNOSIS — J3081 Allergic rhinitis due to animal (cat) (dog) hair and dander: Secondary | ICD-10-CM | POA: Diagnosis not present

## 2020-01-23 DIAGNOSIS — J301 Allergic rhinitis due to pollen: Secondary | ICD-10-CM | POA: Diagnosis not present

## 2020-01-25 MED FILL — BREO ELLIPTA 200-25 MCG INH: 200-25 | 30 days supply | Qty: 60 | Fill #4

## 2020-01-31 DIAGNOSIS — J3089 Other allergic rhinitis: Secondary | ICD-10-CM | POA: Diagnosis not present

## 2020-01-31 DIAGNOSIS — J301 Allergic rhinitis due to pollen: Secondary | ICD-10-CM | POA: Diagnosis not present

## 2020-01-31 DIAGNOSIS — J3081 Allergic rhinitis due to animal (cat) (dog) hair and dander: Secondary | ICD-10-CM | POA: Diagnosis not present

## 2020-02-05 ENCOUNTER — Other Ambulatory Visit (HOSPITAL_COMMUNITY): Payer: Self-pay | Admitting: Allergy

## 2020-02-05 MED FILL — MONTELUKAST SOD 10 MG TAB: 10 | 90 days supply | Qty: 90 | Fill #0

## 2020-02-05 MED FILL — LEVOCETIRIZINE 5 MG TABLET: 5 | 90 days supply | Qty: 90 | Fill #0

## 2020-02-05 MED FILL — ALISKIREN FUMARATE 150 MG T: 150 | 90 days supply | Qty: 90 | Fill #1

## 2020-02-14 DIAGNOSIS — J3089 Other allergic rhinitis: Secondary | ICD-10-CM | POA: Diagnosis not present

## 2020-02-14 DIAGNOSIS — J3081 Allergic rhinitis due to animal (cat) (dog) hair and dander: Secondary | ICD-10-CM | POA: Diagnosis not present

## 2020-02-14 DIAGNOSIS — J301 Allergic rhinitis due to pollen: Secondary | ICD-10-CM | POA: Diagnosis not present

## 2020-02-26 DIAGNOSIS — J3089 Other allergic rhinitis: Secondary | ICD-10-CM | POA: Diagnosis not present

## 2020-02-26 DIAGNOSIS — J301 Allergic rhinitis due to pollen: Secondary | ICD-10-CM | POA: Diagnosis not present

## 2020-02-26 DIAGNOSIS — J3081 Allergic rhinitis due to animal (cat) (dog) hair and dander: Secondary | ICD-10-CM | POA: Diagnosis not present

## 2020-02-27 MED FILL — BREO ELLIPTA 200-25 MCG INH: 200-25 | 30 days supply | Qty: 60 | Fill #5

## 2020-03-04 MED FILL — GABAPENTIN 100 MG CAPSULE: 100 | 30 days supply | Qty: 90 | Fill #2

## 2020-03-07 DIAGNOSIS — G4733 Obstructive sleep apnea (adult) (pediatric): Secondary | ICD-10-CM | POA: Diagnosis not present

## 2020-03-12 DIAGNOSIS — J301 Allergic rhinitis due to pollen: Secondary | ICD-10-CM | POA: Diagnosis not present

## 2020-03-12 DIAGNOSIS — J3081 Allergic rhinitis due to animal (cat) (dog) hair and dander: Secondary | ICD-10-CM | POA: Diagnosis not present

## 2020-03-12 DIAGNOSIS — J3089 Other allergic rhinitis: Secondary | ICD-10-CM | POA: Diagnosis not present

## 2020-03-18 ENCOUNTER — Other Ambulatory Visit: Payer: Self-pay | Admitting: Internal Medicine

## 2020-03-18 DIAGNOSIS — E785 Hyperlipidemia, unspecified: Secondary | ICD-10-CM

## 2020-03-18 MED FILL — EZETIMIBE-SIMVASTATIN 10-20: 10-20 | 90 days supply | Qty: 90 | Fill #0

## 2020-03-26 DIAGNOSIS — J301 Allergic rhinitis due to pollen: Secondary | ICD-10-CM | POA: Diagnosis not present

## 2020-03-26 DIAGNOSIS — J3089 Other allergic rhinitis: Secondary | ICD-10-CM | POA: Diagnosis not present

## 2020-03-26 DIAGNOSIS — J3081 Allergic rhinitis due to animal (cat) (dog) hair and dander: Secondary | ICD-10-CM | POA: Diagnosis not present

## 2020-03-26 MED FILL — BREO ELLIPTA 200-25 MCG INH: 200-25 | 30 days supply | Qty: 60 | Fill #6

## 2020-04-09 DIAGNOSIS — J301 Allergic rhinitis due to pollen: Secondary | ICD-10-CM | POA: Diagnosis not present

## 2020-04-09 DIAGNOSIS — J3089 Other allergic rhinitis: Secondary | ICD-10-CM | POA: Diagnosis not present

## 2020-04-09 DIAGNOSIS — J3081 Allergic rhinitis due to animal (cat) (dog) hair and dander: Secondary | ICD-10-CM | POA: Diagnosis not present

## 2020-04-23 DIAGNOSIS — J3081 Allergic rhinitis due to animal (cat) (dog) hair and dander: Secondary | ICD-10-CM | POA: Diagnosis not present

## 2020-04-23 DIAGNOSIS — J301 Allergic rhinitis due to pollen: Secondary | ICD-10-CM | POA: Diagnosis not present

## 2020-04-23 DIAGNOSIS — J3089 Other allergic rhinitis: Secondary | ICD-10-CM | POA: Diagnosis not present

## 2020-04-28 ENCOUNTER — Other Ambulatory Visit: Payer: Self-pay | Admitting: Internal Medicine

## 2020-04-28 ENCOUNTER — Other Ambulatory Visit (HOSPITAL_COMMUNITY): Payer: Self-pay | Admitting: Allergy

## 2020-04-28 DIAGNOSIS — I10 Essential (primary) hypertension: Secondary | ICD-10-CM

## 2020-04-28 MED FILL — BREO ELLIPTA 200-25 MCG INH: 200-25 | 30 days supply | Qty: 60 | Fill #0

## 2020-04-28 MED FILL — ALISKIREN FUMARATE 150 MG T: 150 | 90 days supply | Qty: 90 | Fill #0

## 2020-05-01 ENCOUNTER — Other Ambulatory Visit: Payer: Self-pay

## 2020-05-01 DIAGNOSIS — J3081 Allergic rhinitis due to animal (cat) (dog) hair and dander: Secondary | ICD-10-CM | POA: Diagnosis not present

## 2020-05-01 DIAGNOSIS — J3089 Other allergic rhinitis: Secondary | ICD-10-CM | POA: Diagnosis not present

## 2020-05-01 DIAGNOSIS — J301 Allergic rhinitis due to pollen: Secondary | ICD-10-CM | POA: Diagnosis not present

## 2020-05-02 ENCOUNTER — Ambulatory Visit (INDEPENDENT_AMBULATORY_CARE_PROVIDER_SITE_OTHER): Payer: 59 | Admitting: Internal Medicine

## 2020-05-02 ENCOUNTER — Encounter: Payer: Self-pay | Admitting: Internal Medicine

## 2020-05-02 ENCOUNTER — Ambulatory Visit (INDEPENDENT_AMBULATORY_CARE_PROVIDER_SITE_OTHER): Payer: 59

## 2020-05-02 VITALS — BP 138/68 | HR 77 | Temp 98.6°F | Ht 68.25 in | Wt 199.0 lb

## 2020-05-02 DIAGNOSIS — J4540 Moderate persistent asthma, uncomplicated: Secondary | ICD-10-CM | POA: Insufficient documentation

## 2020-05-02 DIAGNOSIS — I10 Essential (primary) hypertension: Secondary | ICD-10-CM | POA: Diagnosis not present

## 2020-05-02 DIAGNOSIS — R1012 Left upper quadrant pain: Secondary | ICD-10-CM | POA: Diagnosis not present

## 2020-05-02 DIAGNOSIS — R109 Unspecified abdominal pain: Secondary | ICD-10-CM | POA: Diagnosis not present

## 2020-05-02 DIAGNOSIS — R739 Hyperglycemia, unspecified: Secondary | ICD-10-CM

## 2020-05-02 DIAGNOSIS — J301 Allergic rhinitis due to pollen: Secondary | ICD-10-CM | POA: Insufficient documentation

## 2020-05-02 DIAGNOSIS — J3081 Allergic rhinitis due to animal (cat) (dog) hair and dander: Secondary | ICD-10-CM | POA: Insufficient documentation

## 2020-05-02 DIAGNOSIS — L309 Dermatitis, unspecified: Secondary | ICD-10-CM | POA: Insufficient documentation

## 2020-05-02 LAB — URINALYSIS, ROUTINE W REFLEX MICROSCOPIC
Bilirubin Urine: NEGATIVE
Hgb urine dipstick: NEGATIVE
Ketones, ur: NEGATIVE
Leukocytes,Ua: NEGATIVE
Nitrite: NEGATIVE
Specific Gravity, Urine: >=1.03 — AB (ref 1.000–1.030)
Total Protein, Urine: NEGATIVE
Urine Glucose: NEGATIVE
Urobilinogen, UA: 0.2 (ref 0.0–1.0)
pH: 6 (ref 5.0–8.0)

## 2020-05-02 LAB — HEPATIC FUNCTION PANEL
ALT: 23 U/L (ref 0–53)
AST: 21 U/L (ref 0–37)
Albumin: 4.6 g/dL (ref 3.5–5.2)
Alkaline Phosphatase: 58 U/L (ref 39–117)
Bilirubin, Direct: 0.2 mg/dL (ref 0.0–0.3)
Total Bilirubin: 0.9 mg/dL (ref 0.2–1.2)
Total Protein: 7.1 g/dL (ref 6.0–8.3)

## 2020-05-02 LAB — BASIC METABOLIC PANEL
BUN: 12 mg/dL (ref 6–23)
CO2: 28 mEq/L (ref 19–32)
Calcium: 9.4 mg/dL (ref 8.4–10.5)
Chloride: 101 mEq/L (ref 96–112)
Creatinine, Ser: 0.93 mg/dL (ref 0.40–1.50)
GFR: 85.53 mL/min (ref >60.00)
Glucose, Bld: 102 mg/dL — ABNORMAL HIGH (ref 70–99)
Potassium: 4.3 mEq/L (ref 3.5–5.1)
Sodium: 138 mEq/L (ref 135–145)

## 2020-05-02 LAB — CBC WITH DIFFERENTIAL/PLATELET
Basophils Absolute: 0.1 10*3/uL (ref 0.0–0.1)
Basophils Relative: 1.3 % (ref 0.0–3.0)
Eosinophils Absolute: 0.2 10*3/uL (ref 0.0–0.7)
Eosinophils Relative: 2.6 % (ref 0.0–5.0)
HCT: 47.5 % (ref 39.0–52.0)
Hemoglobin: 16.5 g/dL (ref 13.0–17.0)
Lymphocytes Relative: 21.4 % (ref 12.0–46.0)
Lymphs Abs: 1.3 10*3/uL (ref 0.7–4.0)
MCHC: 34.7 g/dL (ref 30.0–36.0)
MCV: 90.6 fl (ref 78.0–100.0)
Monocytes Absolute: 0.8 10*3/uL (ref 0.1–1.0)
Monocytes Relative: 13.5 % — ABNORMAL HIGH (ref 3.0–12.0)
Neutro Abs: 3.7 10*3/uL (ref 1.4–7.7)
Neutrophils Relative %: 61.2 % (ref 43.0–77.0)
Platelets: 190 10*3/uL (ref 150.0–400.0)
RBC: 5.25 Mil/uL (ref 4.22–5.81)
RDW: 13 % (ref 11.5–15.5)
WBC: 6 10*3/uL (ref 4.0–10.5)

## 2020-05-02 LAB — LIPASE: Lipase: 26 U/L (ref 11.0–59.0)

## 2020-05-02 NOTE — Patient Instructions (Addendum)
The source of your discomfort is not clear today  Please continue all other medications as before, and refills have been done if requested.  Please have the pharmacy call with any other refills you may need.  Please keep your appointments with your specialists as you may have planned  Please go to the XRAY Department in the first floor for the x-ray testing  Please go to the LAB at the blood drawing area for the tests to be done  You will be contacted by phone if any changes need to be made immediately.  Otherwise, you will receive a letter about your results with an explanation, but please check with MyChart first.  Please remember to sign up for MyChart if you have not done so, as this will be important to you in the future with finding out test results, communicating by private email, and scheduling acute appointments online when needed.  Kenneth Sellers on your Trip!

## 2020-05-02 NOTE — Progress Notes (Signed)
Patient ID: Kenneth Sellers, male   DOB: 03-31-1955, 65 y.o.   MRN: [id number]        Chief Complaint: LUQ pain       HPI:  Kenneth Sellers is a 65 y.o. male here with c/o 6 days onset constant mild LUQ pain vague in exact location - maybe lower chest, maybe upper abd, with radiation following around to the left flank area, nothing seems sore or swelling and no rash but still concerned about possible shingles so came to have it looked at; has hx of renal stone but this is different; no recent trauma, worsening constipation and denies all else - Denies worsening reflux, abd pain, dysphagia, n/v, bowel change or blood.  Pt denies chest pain, increased sob or doe, wheezing, orthopnea, PND, increased LE swelling, palpitations, dizziness or syncope.  Denies urinary symptoms such as dysuria, frequency, urgency, flank pain, hematuria or n/v, fever, chills.  He even tried to increase the gabapentin 300 tid and not helping.  Was lifting boxes yesterday and did not feel worse.   Pt denies fever, wt loss, night sweats, loss of appetite, or other constitutional symptoms     Going out of town on trip soon [place] Readings from Last 3 Encounters:  05/02/20 199 lb (90.3 kg)  01/10/20 199 lb 8 oz (90.5 kg)  12/31/19 202 lb (91.6 kg)   BP Readings from Last 3 Encounters:  05/02/20 138/68  01/10/20 140/70  12/31/19 138/84         Past Medical History:  Diagnosis Date  . Allergic rhinitis    Dr. Donneta Romberg  . Allergy   . Arthritis    hands  . Asthma   . Cataract    right eye,12/01/18  . Diverticulosis of colon   . GERD (gastroesophageal reflux disease)   . Hyperlipidemia   . Hypertension   . Neuromuscular disorder ([place])    essential tremors  . OSA (obstructive sleep apnea)    PSG 08/26/02 AHI 59--CPAP 7 cm  . Pneumonia 1988   treated as OP  . Sleep apnea    wears cpap   . Tachycardia 2003   Dr. Mare Ferrari, negative cardiac work up   Past Surgical History:  Procedure Laterality Date  . colon tics  03/2005    [facility]  . COLONOSCOPY  03/2005  . lesion removed L neck; pigmented mole at hairline, non melanoma  07/2003  . NISSEN FUNDOPLICATION  1696    Dr Margot Chimes; complicated by herniation post op with retching requiring 2nd surgery  . POLYPECTOMY    . RHINOPLASTY  1983    reports that he has never smoked. He has never used smokeless tobacco. He reports current alcohol use of about 2.0 standard drinks of alcohol per week. He reports that he does not use drugs. family history includes Allergies in his sister; Arthritis in his sister; COPD in his brother; Diabetes in his brother, mother, and sister; Heart attack (age of onset: 53) in his brother; Heart attack (age of onset: 62) in his father; Heart disease in his brother; Heart failure in his mother; Hypertension in his brother and mother; Stroke (age of onset: 77) in his mother. No Known Allergies Current Outpatient Medications on File Prior to Visit  Medication Sig Dispense Refill  . albuterol (VENTOLIN HFA) 108 (90 Base) MCG/ACT inhaler Inhale 1-2 puffs into the lungs every 6 (six) hours as needed for wheezing or shortness of breath. 18 g 5  . aliskiren (TEKTURNA) 150 MG tablet  TAKE 1 TABLET BY MOUTH ONCE DAILY 90 tablet 1  . AMBULATORY NON FORMULARY MEDICATION CPAP machine nightly    . EPINEPHrine 0.3 mg/0.3 mL IJ SOAJ injection Inject 0.3 mg into the skin once as needed for anaphylaxis.    Marland Kitchen ezetimibe-simvastatin (VYTORIN) 10-20 MG tablet TAKE 1 TABLET BY MOUTH EVERY NIGHT AT BEDTIME 90 tablet 1  . fluticasone (FLONASE) 50 MCG/ACT nasal spray Place 2 sprays into both nostrils daily. (Patient taking differently: Place 2 sprays into both nostrils daily as needed for allergies.) 48 g 1  . fluticasone furoate-vilanterol (BREO ELLIPTA) 200-25 MCG/INH AEPB Inhale 1 puff into the lungs daily.    Marland Kitchen gabapentin (NEURONTIN) 100 MG capsule Take 1 capsule (100 mg total) by mouth 3 (three) times daily. 90 capsule 3  . levocetirizine (XYZAL) 5 MG tablet Take 1  tablet (5 mg total) by mouth daily as needed. (Patient taking differently: Take 5 mg by mouth every evening.) 90 tablet 1  . montelukast (SINGULAIR) 10 MG tablet Take 1 tablet (10 mg total) by mouth at bedtime. 90 tablet 1  . Multiple Vitamins-Minerals (MULTIVITAMIN ADULTS PO) Take 1 tablet by mouth daily.     . Probiotic Product (RESTORA) CAPS TAKE 1 CAPSULE BY MOUTH DAILY. 90 capsule 1  . ALISKIREN FUMARATE PO     . ezetimibe-simvastatin (VYTORIN) 10-10 MG tablet     . METAMUCIL FIBER PO Take 1 Dose by mouth as needed.     No current facility-administered medications on file prior to visit.        ROS:  All others reviewed and negative.  Objective        PE:  BP 138/68   Pulse 77   Temp 98.6 F (37 C) (Oral)   Ht 5' 8.25" (1.734 m)   Wt 199 lb (90.3 kg)   SpO2 98%   BMI 30.04 kg/m                 Constitutional: Pt appears in NAD               HENT: Head: NCAT.                Right Ear: External ear normal.                 Left Ear: External ear normal.                Eyes: . Pupils are equal, round, and reactive to light. Conjunctivae and EOM are normal               Nose: without d/c or deformity               Neck: Neck supple. Gross normal ROM               Cardiovascular: Normal rate and regular rhythm.                 Pulmonary/Chest: Effort normal and breath sounds without rales or wheezing.                Abd:  Soft, NT, ND, + BS, no organomegaly               Neurological: Pt is alert. At baseline orientation, motor grossly intact               Skin: Skin is warm. No rashes, no other new lesions, LE edema - none  Psychiatric: Pt behavior is normal without agitation   Micro: none  Cardiac tracings I have personally interpreted today:  none  Pertinent Radiological findings (summarize): none   Lab Results  Component Value Date   WBC 6.0 05/02/2020   HGB 16.5 05/02/2020   HCT 47.5 05/02/2020   PLT 190.0 05/02/2020   GLUCOSE 102 (H) 05/02/2020    CHOL 158 12/31/2019   TRIG 141.0 12/31/2019   HDL 52.40 12/31/2019   LDLCALC 77 12/31/2019   ALT 23 05/02/2020   AST 21 05/02/2020   NA 138 05/02/2020   K 4.3 05/02/2020   CL 101 05/02/2020   CREATININE 0.93 05/02/2020   BUN 12 05/02/2020   CO2 28 05/02/2020   TSH 3.73 12/31/2019   PSA 0.28 12/31/2019   HGBA1C 5.7 12/31/2019   Assessment/Plan:  Kenneth Sellers is a 65 y.o. White or Caucasian [1] male with  has a past medical history of Allergic rhinitis, Allergy, Arthritis, Asthma, Cataract, Diverticulosis of colon, GERD (gastroesophageal reflux disease), Hyperlipidemia, Hypertension, Neuromuscular disorder ([place]), OSA (obstructive sleep apnea), Pneumonia (1988), Sleep apnea, and Tachycardia (2003).  Continuous LUQ abdominal pain Etiology unclear, I am unable to determine any other symptom that would make the discomfort more likely to be localized to any organ symptoms or msk; will check labs as orderd, and cxr,  to f/u any worsening symptoms or concerns  Hyperglycemia Lab Results  Component Value Date   HGBA1C 5.7 12/31/2019   Stable, pt to continue current medical treatment  - diet  HTN (hypertension) BP Readings from Last 3 Encounters:  05/02/20 138/68  01/10/20 140/70  12/31/19 138/84   Stable, pt to continue medical treatment tekturna   Followup: Return if symptoms worsen or fail to improve.  Cathlean Cower, MD 05/03/2020 6:43 PM [facility]

## 2020-05-03 ENCOUNTER — Encounter: Payer: Self-pay | Admitting: Internal Medicine

## 2020-05-03 DIAGNOSIS — R1012 Left upper quadrant pain: Secondary | ICD-10-CM | POA: Insufficient documentation

## 2020-05-03 DIAGNOSIS — I10 Essential (primary) hypertension: Secondary | ICD-10-CM | POA: Insufficient documentation

## 2020-05-03 NOTE — Assessment & Plan Note (Addendum)
Lab Results  Component Value Date   HGBA1C 5.7 12/31/2019   Stable, pt to continue current medical treatment  - diet

## 2020-05-03 NOTE — Assessment & Plan Note (Signed)
BP Readings from Last 3 Encounters:  05/02/20 138/68  01/10/20 140/70  12/31/19 138/84   Stable, pt to continue medical treatment tekturna

## 2020-05-03 NOTE — Assessment & Plan Note (Signed)
Etiology unclear, I am unable to determine any other symptom that would make the discomfort more likely to be localized to any organ symptoms or msk; will check labs as orderd, and cxr,  to f/u any worsening symptoms or concerns

## 2020-05-05 ENCOUNTER — Encounter: Payer: Self-pay | Admitting: Internal Medicine

## 2020-05-14 DIAGNOSIS — J301 Allergic rhinitis due to pollen: Secondary | ICD-10-CM | POA: Diagnosis not present

## 2020-05-14 DIAGNOSIS — J3081 Allergic rhinitis due to animal (cat) (dog) hair and dander: Secondary | ICD-10-CM | POA: Diagnosis not present

## 2020-05-14 DIAGNOSIS — J3089 Other allergic rhinitis: Secondary | ICD-10-CM | POA: Diagnosis not present

## 2020-05-16 DIAGNOSIS — J3081 Allergic rhinitis due to animal (cat) (dog) hair and dander: Secondary | ICD-10-CM | POA: Diagnosis not present

## 2020-05-16 DIAGNOSIS — J301 Allergic rhinitis due to pollen: Secondary | ICD-10-CM | POA: Diagnosis not present

## 2020-05-16 DIAGNOSIS — J3089 Other allergic rhinitis: Secondary | ICD-10-CM | POA: Diagnosis not present

## 2020-05-17 DIAGNOSIS — J3081 Allergic rhinitis due to animal (cat) (dog) hair and dander: Secondary | ICD-10-CM | POA: Diagnosis not present

## 2020-05-17 DIAGNOSIS — J3089 Other allergic rhinitis: Secondary | ICD-10-CM | POA: Diagnosis not present

## 2020-05-17 DIAGNOSIS — J301 Allergic rhinitis due to pollen: Secondary | ICD-10-CM | POA: Diagnosis not present

## 2020-05-20 ENCOUNTER — Other Ambulatory Visit (HOSPITAL_COMMUNITY): Payer: Self-pay

## 2020-05-20 MED FILL — Gabapentin Cap 100 MG: ORAL | 30 days supply | Qty: 90 | Fill #0 | Status: AC

## 2020-05-22 DIAGNOSIS — J301 Allergic rhinitis due to pollen: Secondary | ICD-10-CM | POA: Diagnosis not present

## 2020-05-22 DIAGNOSIS — J3089 Other allergic rhinitis: Secondary | ICD-10-CM | POA: Diagnosis not present

## 2020-05-22 DIAGNOSIS — J3081 Allergic rhinitis due to animal (cat) (dog) hair and dander: Secondary | ICD-10-CM | POA: Diagnosis not present

## 2020-06-02 MED FILL — Fluticasone Furoate-Vilanterol Aero Powd BA 200-25 MCG/ACT: RESPIRATORY_TRACT | 30 days supply | Qty: 60 | Fill #0 | Status: AC

## 2020-06-03 ENCOUNTER — Other Ambulatory Visit (HOSPITAL_COMMUNITY): Payer: Self-pay

## 2020-06-05 DIAGNOSIS — G4733 Obstructive sleep apnea (adult) (pediatric): Secondary | ICD-10-CM | POA: Diagnosis not present

## 2020-06-06 DIAGNOSIS — J3089 Other allergic rhinitis: Secondary | ICD-10-CM | POA: Diagnosis not present

## 2020-06-06 DIAGNOSIS — J301 Allergic rhinitis due to pollen: Secondary | ICD-10-CM | POA: Diagnosis not present

## 2020-06-06 DIAGNOSIS — J3081 Allergic rhinitis due to animal (cat) (dog) hair and dander: Secondary | ICD-10-CM | POA: Diagnosis not present

## 2020-06-12 DIAGNOSIS — J301 Allergic rhinitis due to pollen: Secondary | ICD-10-CM | POA: Diagnosis not present

## 2020-06-12 DIAGNOSIS — J3081 Allergic rhinitis due to animal (cat) (dog) hair and dander: Secondary | ICD-10-CM | POA: Diagnosis not present

## 2020-06-12 DIAGNOSIS — J3089 Other allergic rhinitis: Secondary | ICD-10-CM | POA: Diagnosis not present

## 2020-06-17 ENCOUNTER — Other Ambulatory Visit (HOSPITAL_COMMUNITY): Payer: Self-pay

## 2020-06-17 MED FILL — Ezetimibe-Simvastatin Tab 10-20 MG: ORAL | 90 days supply | Qty: 90 | Fill #0 | Status: AC

## 2020-06-20 ENCOUNTER — Ambulatory Visit: Payer: 59 | Attending: Internal Medicine

## 2020-06-20 ENCOUNTER — Other Ambulatory Visit (HOSPITAL_BASED_OUTPATIENT_CLINIC_OR_DEPARTMENT_OTHER): Payer: Self-pay

## 2020-06-20 ENCOUNTER — Other Ambulatory Visit: Payer: Self-pay

## 2020-06-20 DIAGNOSIS — Z23 Encounter for immunization: Secondary | ICD-10-CM

## 2020-06-20 MED ORDER — PFIZER-BIONT COVID-19 VAC-TRIS 30 MCG/0.3ML IM SUSP
INTRAMUSCULAR | 0 refills | Status: DC
  Filled 2020-06-20: qty 0.3, 1d supply, fill #0

## 2020-06-20 NOTE — Progress Notes (Signed)
   Covid-19 Vaccination Clinic  Name:  Kenneth Sellers    MRN: [id number] DOB: [birth year]  06/20/2020  [name] was observed post Covid-19 immunization for 15 minutes without incident. He was provided with Vaccine Information Sheet and instruction to access the V-Safe system.   [name] was instructed to call 911 with any severe reactions post vaccine: Marland Kitchen Difficulty breathing  . Swelling of face and throat  . A fast heartbeat  . A bad rash all over body  . Dizziness and weakness   Immunizations Administered    Name Date Dose VIS Date Route   PFIZER Comrnaty(Gray TOP) Covid-19 Vaccine 06/20/2020  9:33 AM 0.3 mL 01/17/2020 Intramuscular   Manufacturer: [employer], [employer]   Lot: [id number]   NDC: [phone]

## 2020-06-24 DIAGNOSIS — J301 Allergic rhinitis due to pollen: Secondary | ICD-10-CM | POA: Diagnosis not present

## 2020-06-24 DIAGNOSIS — H524 Presbyopia: Secondary | ICD-10-CM | POA: Diagnosis not present

## 2020-06-24 DIAGNOSIS — J3089 Other allergic rhinitis: Secondary | ICD-10-CM | POA: Diagnosis not present

## 2020-06-24 DIAGNOSIS — J3081 Allergic rhinitis due to animal (cat) (dog) hair and dander: Secondary | ICD-10-CM | POA: Diagnosis not present

## 2020-06-29 ENCOUNTER — Other Ambulatory Visit: Payer: Self-pay

## 2020-06-30 ENCOUNTER — Other Ambulatory Visit (HOSPITAL_COMMUNITY): Payer: Self-pay

## 2020-06-30 ENCOUNTER — Ambulatory Visit (INDEPENDENT_AMBULATORY_CARE_PROVIDER_SITE_OTHER): Payer: 59 | Admitting: Internal Medicine

## 2020-06-30 ENCOUNTER — Other Ambulatory Visit: Payer: Self-pay

## 2020-06-30 ENCOUNTER — Encounter: Payer: Self-pay | Admitting: Internal Medicine

## 2020-06-30 VITALS — BP 142/86 | HR 80 | Temp 98.2°F | Resp 16 | Ht 68.25 in | Wt 197.0 lb

## 2020-06-30 DIAGNOSIS — I10 Essential (primary) hypertension: Secondary | ICD-10-CM

## 2020-06-30 DIAGNOSIS — H6993 Unspecified Eustachian tube disorder, bilateral: Secondary | ICD-10-CM | POA: Insufficient documentation

## 2020-06-30 DIAGNOSIS — G250 Essential tremor: Secondary | ICD-10-CM | POA: Diagnosis not present

## 2020-06-30 DIAGNOSIS — L280 Lichen simplex chronicus: Secondary | ICD-10-CM

## 2020-06-30 DIAGNOSIS — H6983 Other specified disorders of Eustachian tube, bilateral: Secondary | ICD-10-CM | POA: Diagnosis not present

## 2020-06-30 MED ORDER — METHYLPREDNISOLONE 4 MG PO TBPK
ORAL_TABLET | ORAL | 0 refills | Status: AC
Start: 2020-06-30 — End: 2020-07-06
  Filled 2020-06-30: qty 21, 6d supply, fill #0

## 2020-06-30 MED ORDER — GABAPENTIN 100 MG PO CAPS
ORAL_CAPSULE | Freq: Three times a day (TID) | ORAL | 1 refills | Status: DC
  Filled 2020-06-30: qty 270, 90d supply, fill #0
  Filled 2020-11-11: qty 270, 90d supply, fill #1

## 2020-06-30 NOTE — Progress Notes (Signed)
Subjective:  Patient ID: Kenneth Sellers, male    DOB: 1955/04/05  Age: 65 y.o. MRN: [id number]  CC: Hypertension  This visit occurred during the SARS-CoV-2 public health emergency.  Safety protocols were in place, including screening questions prior to the visit, additional usage of staff PPE, and extensive cleaning of exam room while observing appropriate contact time as indicated for disinfecting solutions.    HPI NIJAH ORLICH presents for f/up -  About 2 weeks ago he had an episode of itchy watery eyes and tells me those symptoms have resolved but now he has a muffled sensation in both ears with ringing in the ears and a decreased level of hearing.  He denies bleeding, discharge, dizziness, or lightheadedness.  He is active in his yard, uses a push mower, and does not experience CP, DOE, palpitations, edema, or fatigue.  Outpatient Medications Prior to Visit  Medication Sig Dispense Refill  . albuterol (VENTOLIN HFA) 108 (90 Base) MCG/ACT inhaler Inhale 1-2 puffs into the lungs every 6 (six) hours as needed for wheezing or shortness of breath. 18 g 5  . aliskiren (TEKTURNA) 150 MG tablet TAKE 1 TABLET BY MOUTH ONCE DAILY 90 tablet 1  . ALISKIREN FUMARATE PO     . AMBULATORY NON FORMULARY MEDICATION CPAP machine nightly    . EPINEPHrine 0.3 mg/0.3 mL IJ SOAJ injection Inject 0.3 mg into the skin once as needed for anaphylaxis.    Marland Kitchen ezetimibe-simvastatin (VYTORIN) 10-20 MG tablet TAKE 1 TABLET BY MOUTH EVERY NIGHT AT BEDTIME 90 tablet 1  . fluticasone (FLONASE) 50 MCG/ACT nasal spray Place 2 sprays into both nostrils daily. (Patient taking differently: Place 2 sprays into both nostrils daily as needed for allergies.) 48 g 1  . fluticasone furoate-vilanterol (BREO ELLIPTA) 200-25 MCG/INH AEPB INHALE 1 PUFF INTO THE LUNGS ONCE DAILY 60 each 6  . levocetirizine (XYZAL) 5 MG tablet Take 1 tablet (5 mg total) by mouth daily as needed. (Patient taking differently: Take 5 mg by mouth every evening.)  90 tablet 1  . METAMUCIL FIBER PO Take 1 Dose by mouth as needed.    . montelukast (SINGULAIR) 10 MG tablet TAKE 1 TABLET BY MOUTH ONCE DAILY IN THE EVENING 90 tablet 1  . Multiple Vitamins-Minerals (MULTIVITAMIN ADULTS PO) Take 1 tablet by mouth daily.     . Probiotic Product (RESTORA) CAPS TAKE 1 CAPSULE BY MOUTH DAILY. 90 capsule 1  . COVID-19 mRNA Vac-TriS, Pfizer, (PFIZER-BIONT COVID-19 VAC-TRIS) SUSP injection Inject into the muscle. 0.3 mL 0  . ezetimibe-simvastatin (VYTORIN) 10-10 MG tablet     . fluticasone furoate-vilanterol (BREO ELLIPTA) 200-25 MCG/INH AEPB Inhale 1 puff into the lungs daily.    . fluticasone furoate-vilanterol (BREO ELLIPTA) 200-25 MCG/INH AEPB INHALE 1 PUFF INTO THE LUNGS ONCE DAILY AS DIRECTED 60 each 3  . gabapentin (NEURONTIN) 100 MG capsule TAKE 1 CAPSULE BY MOUTH 3 TIMES DAILY 90 capsule 3  . influenza vaccine adjuvanted (FLUAD) 0.5 ML injection TO BE ADMINSITERED BY THE PHARMACIST .5 mL 0  . levocetirizine (XYZAL) 5 MG tablet TAKE 1 TABLET BY MOUTH ONCE DAILY IN THE EVENING 90 tablet 1  . montelukast (SINGULAIR) 10 MG tablet Take 1 tablet (10 mg total) by mouth at bedtime. 90 tablet 1   No facility-administered medications prior to visit.    ROS Review of Systems  Constitutional: Negative for chills, diaphoresis, fatigue and fever.  HENT: Positive for ear pain and tinnitus. Negative for congestion, facial swelling, rhinorrhea, sinus pressure,  trouble swallowing and voice change.   Eyes: Negative.   Respiratory: Negative for cough, chest tightness, shortness of breath and wheezing.   Cardiovascular: Negative for chest pain, palpitations and leg swelling.  Gastrointestinal: Negative for abdominal pain, constipation, diarrhea, nausea and vomiting.  Endocrine: Negative.   Genitourinary: Negative.  Negative for difficulty urinating.  Musculoskeletal: Negative.  Negative for arthralgias, myalgias and neck pain.  Skin: Negative.  Negative for color change and  pallor.  Neurological: Positive for tremors. Negative for dizziness, weakness, light-headedness, numbness and headaches.  Hematological: Negative for adenopathy. Does not bruise/bleed easily.  Psychiatric/Behavioral: Negative.     Objective:  BP (!) 142/86 (BP Location: Right Arm, Patient Position: Sitting, Cuff Size: Large)   Pulse 80   Temp 98.2 F (36.8 C) (Oral)   Resp 16   Ht 5' 8.25" (1.734 m)   Wt 197 lb (89.4 kg)   SpO2 98%   BMI 29.73 kg/m   BP Readings from Last 3 Encounters:  06/30/20 (!) 142/86  05/02/20 138/68  01/10/20 140/70    Wt Readings from Last 3 Encounters:  06/30/20 197 lb (89.4 kg)  05/02/20 199 lb (90.3 kg)  01/10/20 199 lb 8 oz (90.5 kg)    Physical Exam Vitals reviewed.  Constitutional:      Appearance: Normal appearance.  HENT:     Right Ear: Hearing, tympanic membrane, ear canal and external ear normal. No middle ear effusion. There is no impacted cerumen. Tympanic membrane is not erythematous.     Left Ear: Hearing, tympanic membrane, ear canal and external ear normal.  No middle ear effusion. There is no impacted cerumen. Tympanic membrane is not erythematous.     Mouth/Throat:     Mouth: Mucous membranes are moist.  Eyes:     General: No scleral icterus.    Conjunctiva/sclera: Conjunctivae normal.  Cardiovascular:     Rate and Rhythm: Normal rate and regular rhythm.     Heart sounds: No murmur heard.   Pulmonary:     Effort: Pulmonary effort is normal.     Breath sounds: No stridor. No wheezing, rhonchi or rales.  Abdominal:     General: Abdomen is flat. Bowel sounds are normal. There is no distension.     Palpations: Abdomen is soft. There is no hepatomegaly, splenomegaly or mass.     Tenderness: There is no abdominal tenderness.  Musculoskeletal:        General: Normal range of motion.     Cervical back: Neck supple.  Lymphadenopathy:     Cervical: No cervical adenopathy.  Skin:    General: Skin is warm.  Neurological:      General: No focal deficit present.     Mental Status: He is alert.  Psychiatric:        Mood and Affect: Mood normal.        Behavior: Behavior normal.     Lab Results  Component Value Date   WBC 6.0 05/02/2020   HGB 16.5 05/02/2020   HCT 47.5 05/02/2020   PLT 190.0 05/02/2020   GLUCOSE 102 (H) 05/02/2020   CHOL 158 12/31/2019   TRIG 141.0 12/31/2019   HDL 52.40 12/31/2019   LDLCALC 77 12/31/2019   ALT 23 05/02/2020   AST 21 05/02/2020   NA 138 05/02/2020   K 4.3 05/02/2020   CL 101 05/02/2020   CREATININE 0.93 05/02/2020   BUN 12 05/02/2020   CO2 28 05/02/2020   TSH 3.73 12/31/2019   PSA 0.28 12/31/2019  HGBA1C 5.7 12/31/2019    CT ABDOMEN PELVIS W CONTRAST  Result Date: 03/26/2019 CLINICAL DATA:  Abdominal pain and distension.  Dysuria. EXAM: CT ABDOMEN AND PELVIS WITH CONTRAST TECHNIQUE: Multidetector CT imaging of the abdomen and pelvis was performed using the standard protocol following bolus administration of intravenous contrast. CONTRAST:  159mL OMNIPAQUE IOHEXOL 300 MG/ML  SOLN COMPARISON:  October 27, 2015. FINDINGS: Lower chest: There is slight atelectatic change in the left base. No lung base edema or consolidation. Hepatobiliary: There is borderline hepatic steatosis. No focal liver lesions are evident. The gallbladder wall is not appreciably thickened. There is no biliary duct dilatation. Pancreas: There is no pancreatic mass or inflammatory focus. Spleen: No splenic lesions are evident. Adrenals/Urinary Tract: Adrenals bilaterally appear normal. There is a cyst arising from the medial upper pole of the left kidney measuring 1.8 x 1.2 cm. There is slight hydronephrosis on the left. There is no hydronephrosis on the right. There is no intrarenal calculus on either side. There is a 3 x 3 mm calculus at the left ureterovesical junction. No other ureteral calculi are evident. Urinary bladder is midline with wall thickness within normal limits. Stomach/Bowel: There are  surgical clips in the left upper abdomen near the stomach consistent with previous Nissen fundoplication type procedure. There is no bowel wall thickening or fluid in this area of postoperative change. Elsewhere, there is no appreciable bowel wall or mesenteric thickening. There are occasional sigmoid diverticula without diverticulitis. There is no evident bowel obstruction. The terminal ileum appears normal. There is no evident free air or portal venous air. Vascular/Lymphatic: No abdominal aortic aneurysm. There is atherosclerotic calcification in the aorta and common iliac arteries. Major venous structures appear patent. There is no evident adenopathy in the abdomen or pelvis. Reproductive: There is minimal prostatic calcification. Prostate and seminal vesicles are normal in size and contour. No evident pelvic mass. Other: The appendix appears unremarkable. No evident abscess or ascites in the abdomen or pelvis. There is a small ventral hernia immediately to the right of midline in the mid abdominal region containing fat but no bowel. There is muscle thinning at the umbilicus anteriorly. Musculoskeletal: Slight anterior wedging at L2 is stable. Scattered areas of degenerative change in the lumbar and lower thoracic spine. No blastic or lytic bone lesions. No intramuscular lesion evident. IMPRESSION: 1. 3 x 3 mm calculus at the left ureterovesical junction with mild hydronephrosis on the left. 2. Postoperative change in the perigastric region. No inflammatory focus in this area. No bowel obstruction. No abscess in the abdomen or pelvis. Appendix appears normal. There are occasional sigmoid diverticula without diverticulitis. 3.  Borderline hepatic steatosis. 4. Ventral hernia immediately to the right of midline in the mid abdomen containing fat but no bowel. Electronically Signed   By: Lowella Grip III M.D.   On: 03/26/2019 13:24   DG Abdomen Acute W/Chest  Result Date: 03/26/2019 CLINICAL DATA:  Left  lower quadrant pain EXAM: DG ABDOMEN ACUTE W/ 1V CHEST COMPARISON:  None. FINDINGS: Cardiac shadow is within normal limits. The lungs are well aerated bilaterally. No acute bony abnormality is seen. Scattered large and small bowel gas is noted. No free air is seen. Postsurgical changes are noted in the left upper quadrant. No abnormal mass or abnormal calcifications are noted. No acute bony abnormality is seen. Mild degenerative changes of the lumbar spine are noted. IMPRESSION: No free air is seen. No acute abnormality in the chest and abdomen. Electronically Signed   By: Elta Guadeloupe  Lukens M.D.   On: 03/26/2019 12:56    Assessment & Plan:   Hartley was seen today for hypertension.  Diagnoses and all orders for this visit:  Acute dysfunction of both eustachian tubes- Will treat with a 6-day course of methylprednisolone. -     methylPREDNISolone (MEDROL DOSEPAK) 4 MG TBPK tablet; TAKE AS DIRECTED  Primary hypertension- His blood pressure is adequately well controlled.  Neurodermatitis, localized -     gabapentin (NEURONTIN) 100 MG capsule; TAKE 1 CAPSULE BY MOUTH 3 TIMES DAILY  Benign essential tremor -     gabapentin (NEURONTIN) 100 MG capsule; TAKE 1 CAPSULE BY MOUTH 3 TIMES DAILY   I have discontinued May L. Pottinger's influenza vaccine adjuvanted and Pfizer-BioNT COVID-19 Vac-TriS. I am also having him start on methylPREDNISolone. Additionally, I am having him maintain his AMBULATORY NON FORMULARY MEDICATION, fluticasone, levocetirizine, albuterol, Multiple Vitamins-Minerals (MULTIVITAMIN ADULTS PO), Restora, EPINEPHrine, METAMUCIL FIBER PO, ALISKIREN FUMARATE PO, aliskiren, ezetimibe-simvastatin, montelukast, fluticasone furoate-vilanterol, and gabapentin.  Meds ordered this encounter  Medications  . methylPREDNISolone (MEDROL DOSEPAK) 4 MG TBPK tablet    Sig: TAKE AS DIRECTED    Dispense:  21 tablet    Refill:  0  . gabapentin (NEURONTIN) 100 MG capsule    Sig: TAKE 1 CAPSULE BY MOUTH 3  TIMES DAILY    Dispense:  270 capsule    Refill:  1     Follow-up: Return if symptoms worsen or fail to improve.  Scarlette Calico, MD

## 2020-06-30 NOTE — Patient Instructions (Addendum)

## 2020-07-01 ENCOUNTER — Encounter: Payer: Self-pay | Admitting: Internal Medicine

## 2020-07-03 ENCOUNTER — Other Ambulatory Visit (HOSPITAL_COMMUNITY): Payer: Self-pay

## 2020-07-03 DIAGNOSIS — J3089 Other allergic rhinitis: Secondary | ICD-10-CM | POA: Diagnosis not present

## 2020-07-03 DIAGNOSIS — J3081 Allergic rhinitis due to animal (cat) (dog) hair and dander: Secondary | ICD-10-CM | POA: Diagnosis not present

## 2020-07-03 DIAGNOSIS — J301 Allergic rhinitis due to pollen: Secondary | ICD-10-CM | POA: Diagnosis not present

## 2020-07-03 MED ORDER — EPINEPHRINE 0.3 MG/0.3ML IJ SOAJ
INTRAMUSCULAR | 1 refills | Status: DC
  Filled 2020-07-03: qty 2, 30d supply, fill #0

## 2020-07-08 ENCOUNTER — Other Ambulatory Visit: Payer: Self-pay

## 2020-07-08 ENCOUNTER — Other Ambulatory Visit (HOSPITAL_COMMUNITY): Payer: Self-pay

## 2020-07-08 MED ORDER — BREO ELLIPTA 200-25 MCG/INH IN AEPB
INHALATION_SPRAY | RESPIRATORY_TRACT | 1 refills | Status: DC
  Filled 2020-07-08: qty 60, 30d supply, fill #0
  Filled 2020-08-06: qty 60, 30d supply, fill #1

## 2020-07-09 ENCOUNTER — Other Ambulatory Visit (HOSPITAL_COMMUNITY): Payer: Self-pay

## 2020-07-10 DIAGNOSIS — J3081 Allergic rhinitis due to animal (cat) (dog) hair and dander: Secondary | ICD-10-CM | POA: Diagnosis not present

## 2020-07-10 DIAGNOSIS — J3089 Other allergic rhinitis: Secondary | ICD-10-CM | POA: Diagnosis not present

## 2020-07-10 DIAGNOSIS — J301 Allergic rhinitis due to pollen: Secondary | ICD-10-CM | POA: Diagnosis not present

## 2020-07-24 DIAGNOSIS — J3089 Other allergic rhinitis: Secondary | ICD-10-CM | POA: Diagnosis not present

## 2020-07-24 DIAGNOSIS — J3081 Allergic rhinitis due to animal (cat) (dog) hair and dander: Secondary | ICD-10-CM | POA: Diagnosis not present

## 2020-07-24 DIAGNOSIS — J301 Allergic rhinitis due to pollen: Secondary | ICD-10-CM | POA: Diagnosis not present

## 2020-07-29 ENCOUNTER — Other Ambulatory Visit (HOSPITAL_COMMUNITY): Payer: Self-pay

## 2020-07-29 DIAGNOSIS — J3089 Other allergic rhinitis: Secondary | ICD-10-CM | POA: Diagnosis not present

## 2020-07-29 DIAGNOSIS — J4540 Moderate persistent asthma, uncomplicated: Secondary | ICD-10-CM | POA: Diagnosis not present

## 2020-07-29 DIAGNOSIS — J3081 Allergic rhinitis due to animal (cat) (dog) hair and dander: Secondary | ICD-10-CM | POA: Diagnosis not present

## 2020-07-29 DIAGNOSIS — J301 Allergic rhinitis due to pollen: Secondary | ICD-10-CM | POA: Diagnosis not present

## 2020-07-29 MED ORDER — MONTELUKAST SODIUM 10 MG PO TABS
10.0000 mg | ORAL_TABLET | Freq: Every day | ORAL | 2 refills | Status: DC
  Filled 2020-07-29: qty 90, 90d supply, fill #0
  Filled 2020-10-31: qty 90, 90d supply, fill #1
  Filled 2021-01-20: qty 90, 90d supply, fill #2

## 2020-07-29 MED ORDER — ALBUTEROL SULFATE HFA 108 (90 BASE) MCG/ACT IN AERS
INHALATION_SPRAY | RESPIRATORY_TRACT | 0 refills | Status: DC
  Filled 2020-07-29: qty 18, 16d supply, fill #0

## 2020-07-29 MED ORDER — LEVOCETIRIZINE DIHYDROCHLORIDE 5 MG PO TABS
5.0000 mg | ORAL_TABLET | Freq: Every day | ORAL | 2 refills | Status: DC
  Filled 2020-07-29: qty 90, 90d supply, fill #0
  Filled 2020-10-31: qty 90, 90d supply, fill #1

## 2020-08-06 ENCOUNTER — Other Ambulatory Visit (HOSPITAL_COMMUNITY): Payer: Self-pay

## 2020-08-06 MED FILL — Aliskiren Fumarate Tab 150 MG (Base Equivalent): ORAL | 90 days supply | Qty: 90 | Fill #0 | Status: AC

## 2020-08-07 ENCOUNTER — Other Ambulatory Visit (HOSPITAL_COMMUNITY): Payer: Self-pay

## 2020-08-07 DIAGNOSIS — J3089 Other allergic rhinitis: Secondary | ICD-10-CM | POA: Diagnosis not present

## 2020-08-07 DIAGNOSIS — J301 Allergic rhinitis due to pollen: Secondary | ICD-10-CM | POA: Diagnosis not present

## 2020-08-07 DIAGNOSIS — J3081 Allergic rhinitis due to animal (cat) (dog) hair and dander: Secondary | ICD-10-CM | POA: Diagnosis not present

## 2020-08-21 DIAGNOSIS — J301 Allergic rhinitis due to pollen: Secondary | ICD-10-CM | POA: Diagnosis not present

## 2020-08-21 DIAGNOSIS — J3089 Other allergic rhinitis: Secondary | ICD-10-CM | POA: Diagnosis not present

## 2020-08-21 DIAGNOSIS — J3081 Allergic rhinitis due to animal (cat) (dog) hair and dander: Secondary | ICD-10-CM | POA: Diagnosis not present

## 2020-08-28 DIAGNOSIS — J301 Allergic rhinitis due to pollen: Secondary | ICD-10-CM | POA: Diagnosis not present

## 2020-08-28 DIAGNOSIS — J3081 Allergic rhinitis due to animal (cat) (dog) hair and dander: Secondary | ICD-10-CM | POA: Diagnosis not present

## 2020-08-28 DIAGNOSIS — J3089 Other allergic rhinitis: Secondary | ICD-10-CM | POA: Diagnosis not present

## 2020-09-08 ENCOUNTER — Other Ambulatory Visit (HOSPITAL_COMMUNITY): Payer: Self-pay

## 2020-09-08 ENCOUNTER — Other Ambulatory Visit: Payer: Self-pay | Admitting: Internal Medicine

## 2020-09-08 DIAGNOSIS — E785 Hyperlipidemia, unspecified: Secondary | ICD-10-CM

## 2020-09-09 ENCOUNTER — Other Ambulatory Visit (HOSPITAL_COMMUNITY): Payer: Self-pay

## 2020-09-09 MED ORDER — FLUTICASONE FUROATE-VILANTEROL 200-25 MCG/INH IN AEPB
INHALATION_SPRAY | RESPIRATORY_TRACT | 6 refills | Status: DC
  Filled 2020-09-09: qty 60, 30d supply, fill #0
  Filled 2020-10-13: qty 60, 30d supply, fill #1
  Filled 2020-11-11: qty 60, 30d supply, fill #2

## 2020-09-09 MED ORDER — EZETIMIBE-SIMVASTATIN 10-20 MG PO TABS
1.0000 | ORAL_TABLET | Freq: Every day | ORAL | 1 refills | Status: DC
  Filled 2020-09-09: qty 90, 90d supply, fill #0
  Filled 2020-12-10: qty 90, 90d supply, fill #1

## 2020-09-12 DIAGNOSIS — J3089 Other allergic rhinitis: Secondary | ICD-10-CM | POA: Diagnosis not present

## 2020-09-12 DIAGNOSIS — J301 Allergic rhinitis due to pollen: Secondary | ICD-10-CM | POA: Diagnosis not present

## 2020-09-12 DIAGNOSIS — J3081 Allergic rhinitis due to animal (cat) (dog) hair and dander: Secondary | ICD-10-CM | POA: Diagnosis not present

## 2020-09-24 DIAGNOSIS — J3081 Allergic rhinitis due to animal (cat) (dog) hair and dander: Secondary | ICD-10-CM | POA: Diagnosis not present

## 2020-09-24 DIAGNOSIS — J301 Allergic rhinitis due to pollen: Secondary | ICD-10-CM | POA: Diagnosis not present

## 2020-09-24 DIAGNOSIS — J3089 Other allergic rhinitis: Secondary | ICD-10-CM | POA: Diagnosis not present

## 2020-10-01 DIAGNOSIS — J3081 Allergic rhinitis due to animal (cat) (dog) hair and dander: Secondary | ICD-10-CM | POA: Diagnosis not present

## 2020-10-01 DIAGNOSIS — J301 Allergic rhinitis due to pollen: Secondary | ICD-10-CM | POA: Diagnosis not present

## 2020-10-01 DIAGNOSIS — J3089 Other allergic rhinitis: Secondary | ICD-10-CM | POA: Diagnosis not present

## 2020-10-08 DIAGNOSIS — J3089 Other allergic rhinitis: Secondary | ICD-10-CM | POA: Diagnosis not present

## 2020-10-08 DIAGNOSIS — J301 Allergic rhinitis due to pollen: Secondary | ICD-10-CM | POA: Diagnosis not present

## 2020-10-08 DIAGNOSIS — J3081 Allergic rhinitis due to animal (cat) (dog) hair and dander: Secondary | ICD-10-CM | POA: Diagnosis not present

## 2020-10-09 DIAGNOSIS — G4733 Obstructive sleep apnea (adult) (pediatric): Secondary | ICD-10-CM | POA: Diagnosis not present

## 2020-10-14 ENCOUNTER — Other Ambulatory Visit (HOSPITAL_COMMUNITY): Payer: Self-pay

## 2020-10-15 DIAGNOSIS — J3089 Other allergic rhinitis: Secondary | ICD-10-CM | POA: Diagnosis not present

## 2020-10-15 DIAGNOSIS — J3081 Allergic rhinitis due to animal (cat) (dog) hair and dander: Secondary | ICD-10-CM | POA: Diagnosis not present

## 2020-10-15 DIAGNOSIS — J301 Allergic rhinitis due to pollen: Secondary | ICD-10-CM | POA: Diagnosis not present

## 2020-10-29 DIAGNOSIS — J3089 Other allergic rhinitis: Secondary | ICD-10-CM | POA: Diagnosis not present

## 2020-10-29 DIAGNOSIS — J301 Allergic rhinitis due to pollen: Secondary | ICD-10-CM | POA: Diagnosis not present

## 2020-10-29 DIAGNOSIS — J3081 Allergic rhinitis due to animal (cat) (dog) hair and dander: Secondary | ICD-10-CM | POA: Diagnosis not present

## 2020-10-31 ENCOUNTER — Other Ambulatory Visit (HOSPITAL_COMMUNITY): Payer: Self-pay

## 2020-10-31 ENCOUNTER — Other Ambulatory Visit: Payer: Self-pay | Admitting: Internal Medicine

## 2020-10-31 DIAGNOSIS — I10 Essential (primary) hypertension: Secondary | ICD-10-CM

## 2020-10-31 MED ORDER — ALISKIREN FUMARATE 150 MG PO TABS
ORAL_TABLET | Freq: Every day | ORAL | 1 refills | Status: DC
  Filled 2020-10-31: qty 90, 90d supply, fill #0
  Filled 2021-01-20: qty 90, 90d supply, fill #1

## 2020-11-03 ENCOUNTER — Other Ambulatory Visit (HOSPITAL_COMMUNITY): Payer: Self-pay

## 2020-11-04 ENCOUNTER — Ambulatory Visit: Payer: 59 | Attending: Internal Medicine

## 2020-11-04 ENCOUNTER — Other Ambulatory Visit (HOSPITAL_BASED_OUTPATIENT_CLINIC_OR_DEPARTMENT_OTHER): Payer: Self-pay

## 2020-11-04 DIAGNOSIS — Z23 Encounter for immunization: Secondary | ICD-10-CM

## 2020-11-04 MED ORDER — PFIZER COVID-19 VAC BIVALENT 30 MCG/0.3ML IM SUSP
INTRAMUSCULAR | 0 refills | Status: DC
  Filled 2020-11-04: qty 0.3, 1d supply, fill #0

## 2020-11-04 NOTE — Progress Notes (Signed)
   Covid-19 Vaccination Clinic  Name:  HARRISON ZETINA    MRN: [id number] DOB: 08-Oct-1955  11/04/2020  Mr. Tomberlin was observed post Covid-19 immunization for 15 minutes without incident. He was provided with Vaccine Information Sheet and instruction to access the V-Safe system.   Mr. Choyce was instructed to call 911 with any severe reactions post vaccine: Difficulty breathing  Swelling of face and throat  A fast heartbeat  A bad rash all over body  Dizziness and weakness

## 2020-11-11 ENCOUNTER — Other Ambulatory Visit (HOSPITAL_BASED_OUTPATIENT_CLINIC_OR_DEPARTMENT_OTHER): Payer: Self-pay

## 2020-11-11 MED ORDER — FLUAD QUADRIVALENT 0.5 ML IM PRSY
PREFILLED_SYRINGE | INTRAMUSCULAR | 0 refills | Status: DC
  Filled 2020-11-11: qty 0.5, 1d supply, fill #0

## 2020-11-12 ENCOUNTER — Other Ambulatory Visit (HOSPITAL_COMMUNITY): Payer: Self-pay

## 2020-11-12 DIAGNOSIS — J301 Allergic rhinitis due to pollen: Secondary | ICD-10-CM | POA: Diagnosis not present

## 2020-11-12 DIAGNOSIS — J3089 Other allergic rhinitis: Secondary | ICD-10-CM | POA: Diagnosis not present

## 2020-11-12 DIAGNOSIS — J3081 Allergic rhinitis due to animal (cat) (dog) hair and dander: Secondary | ICD-10-CM | POA: Diagnosis not present

## 2020-11-14 ENCOUNTER — Other Ambulatory Visit (HOSPITAL_COMMUNITY): Payer: Self-pay

## 2020-11-14 DIAGNOSIS — N529 Male erectile dysfunction, unspecified: Secondary | ICD-10-CM | POA: Diagnosis not present

## 2020-11-14 DIAGNOSIS — N481 Balanitis: Secondary | ICD-10-CM | POA: Diagnosis not present

## 2020-11-14 MED ORDER — SILDENAFIL CITRATE 20 MG PO TABS
ORAL_TABLET | ORAL | 6 refills | Status: DC
  Filled 2020-11-14: qty 30, 30d supply, fill #0

## 2020-11-14 MED ORDER — CLOTRIMAZOLE-BETAMETHASONE 1-0.05 % EX CREA
TOPICAL_CREAM | CUTANEOUS | 0 refills | Status: DC
  Filled 2020-11-14: qty 45, 23d supply, fill #0
  Filled 2020-11-14: qty 15, 7d supply, fill #0

## 2020-11-17 ENCOUNTER — Other Ambulatory Visit (HOSPITAL_COMMUNITY): Payer: Self-pay

## 2020-11-18 ENCOUNTER — Other Ambulatory Visit (HOSPITAL_COMMUNITY): Payer: Self-pay

## 2020-11-24 ENCOUNTER — Other Ambulatory Visit (HOSPITAL_BASED_OUTPATIENT_CLINIC_OR_DEPARTMENT_OTHER): Payer: Self-pay

## 2020-11-26 DIAGNOSIS — J3081 Allergic rhinitis due to animal (cat) (dog) hair and dander: Secondary | ICD-10-CM | POA: Diagnosis not present

## 2020-11-26 DIAGNOSIS — J3089 Other allergic rhinitis: Secondary | ICD-10-CM | POA: Diagnosis not present

## 2020-11-26 DIAGNOSIS — J301 Allergic rhinitis due to pollen: Secondary | ICD-10-CM | POA: Diagnosis not present

## 2020-12-08 ENCOUNTER — Other Ambulatory Visit (HOSPITAL_COMMUNITY): Payer: Self-pay

## 2020-12-08 DIAGNOSIS — N529 Male erectile dysfunction, unspecified: Secondary | ICD-10-CM | POA: Diagnosis not present

## 2020-12-08 DIAGNOSIS — N481 Balanitis: Secondary | ICD-10-CM | POA: Diagnosis not present

## 2020-12-08 MED ORDER — TADALAFIL 20 MG PO TABS
ORAL_TABLET | ORAL | 11 refills | Status: DC
  Filled 2020-12-08: qty 6, 30d supply, fill #0
  Filled 2021-02-05: qty 6, 30d supply, fill #1
  Filled 2021-06-08: qty 6, 30d supply, fill #2
  Filled 2021-07-26: qty 6, 30d supply, fill #3
  Filled 2021-10-13: qty 6, 30d supply, fill #4
  Filled 2021-11-26: qty 6, 30d supply, fill #5

## 2020-12-10 ENCOUNTER — Other Ambulatory Visit (HOSPITAL_COMMUNITY): Payer: Self-pay

## 2020-12-10 MED ORDER — FLUTICASONE FUROATE-VILANTEROL 200-25 MCG/ACT IN AEPB
INHALATION_SPRAY | RESPIRATORY_TRACT | 3 refills | Status: DC
  Filled 2020-12-10: qty 60, 30d supply, fill #0
  Filled 2021-01-20: qty 60, 30d supply, fill #1
  Filled 2021-02-19: qty 60, 30d supply, fill #2
  Filled 2021-03-27: qty 60, 30d supply, fill #3

## 2020-12-15 ENCOUNTER — Other Ambulatory Visit: Payer: Self-pay | Admitting: Urology

## 2020-12-17 DIAGNOSIS — J3081 Allergic rhinitis due to animal (cat) (dog) hair and dander: Secondary | ICD-10-CM | POA: Diagnosis not present

## 2020-12-17 DIAGNOSIS — J301 Allergic rhinitis due to pollen: Secondary | ICD-10-CM | POA: Diagnosis not present

## 2020-12-17 DIAGNOSIS — J3089 Other allergic rhinitis: Secondary | ICD-10-CM | POA: Diagnosis not present

## 2020-12-31 DIAGNOSIS — J3089 Other allergic rhinitis: Secondary | ICD-10-CM | POA: Diagnosis not present

## 2020-12-31 DIAGNOSIS — J301 Allergic rhinitis due to pollen: Secondary | ICD-10-CM | POA: Diagnosis not present

## 2020-12-31 DIAGNOSIS — J3081 Allergic rhinitis due to animal (cat) (dog) hair and dander: Secondary | ICD-10-CM | POA: Diagnosis not present

## 2021-01-05 ENCOUNTER — Encounter: Payer: Self-pay | Admitting: Internal Medicine

## 2021-01-05 ENCOUNTER — Ambulatory Visit (INDEPENDENT_AMBULATORY_CARE_PROVIDER_SITE_OTHER): Payer: 59 | Admitting: Internal Medicine

## 2021-01-05 ENCOUNTER — Other Ambulatory Visit: Payer: Self-pay

## 2021-01-05 VITALS — BP 132/74 | HR 67 | Temp 97.8°F | Resp 16 | Ht 69.5 in | Wt 171.0 lb

## 2021-01-05 DIAGNOSIS — I10 Essential (primary) hypertension: Secondary | ICD-10-CM | POA: Diagnosis not present

## 2021-01-05 DIAGNOSIS — Z0001 Encounter for general adult medical examination with abnormal findings: Secondary | ICD-10-CM | POA: Diagnosis not present

## 2021-01-05 DIAGNOSIS — Z23 Encounter for immunization: Secondary | ICD-10-CM

## 2021-01-05 DIAGNOSIS — E785 Hyperlipidemia, unspecified: Secondary | ICD-10-CM

## 2021-01-05 DIAGNOSIS — R739 Hyperglycemia, unspecified: Secondary | ICD-10-CM

## 2021-01-05 LAB — CBC WITH DIFFERENTIAL/PLATELET
Basophils Absolute: 0.1 10*3/uL (ref 0.0–0.1)
Basophils Relative: 1.1 % (ref 0.0–3.0)
Eosinophils Absolute: 0.2 10*3/uL (ref 0.0–0.7)
Eosinophils Relative: 4.9 % (ref 0.0–5.0)
HCT: 46.3 % (ref 39.0–52.0)
Hemoglobin: 15.7 g/dL (ref 13.0–17.0)
Lymphocytes Relative: 21.9 % (ref 12.0–46.0)
Lymphs Abs: 1.1 10*3/uL (ref 0.7–4.0)
MCHC: 33.9 g/dL (ref 30.0–36.0)
MCV: 92.5 fl (ref 78.0–100.0)
Monocytes Absolute: 0.8 10*3/uL (ref 0.1–1.0)
Monocytes Relative: 16.2 % — ABNORMAL HIGH (ref 3.0–12.0)
Neutro Abs: 2.7 10*3/uL (ref 1.4–7.7)
Neutrophils Relative %: 55.9 % (ref 43.0–77.0)
Platelets: 192 10*3/uL (ref 150.0–400.0)
RBC: 5 Mil/uL (ref 4.22–5.81)
RDW: 13.5 % (ref 11.5–15.5)
WBC: 4.9 10*3/uL (ref 4.0–10.5)

## 2021-01-05 LAB — BASIC METABOLIC PANEL
BUN: 15 mg/dL (ref 6–23)
CO2: 31 mEq/L (ref 19–32)
Calcium: 9.3 mg/dL (ref 8.4–10.5)
Chloride: 101 mEq/L (ref 96–112)
Creatinine, Ser: 0.94 mg/dL (ref 0.40–1.50)
GFR: 84.04 mL/min (ref >60.00)
Glucose, Bld: 95 mg/dL (ref 70–99)
Potassium: 4.4 mEq/L (ref 3.5–5.1)
Sodium: 140 mEq/L (ref 135–145)

## 2021-01-05 LAB — LIPID PANEL
Cholesterol: 160 mg/dL (ref 0–200)
HDL: 61.1 mg/dL (ref >39.00)
LDL Cholesterol: 83 mg/dL (ref 0–99)
NonHDL: 99.24
Total CHOL/HDL Ratio: 3
Triglycerides: 81 mg/dL (ref 0.0–149.0)
VLDL: 16.2 mg/dL (ref 0.0–40.0)

## 2021-01-05 LAB — PSA: PSA: 0.33 ng/mL (ref 0.10–4.00)

## 2021-01-05 LAB — TSH: TSH: 3.16 u[IU]/mL (ref 0.35–5.50)

## 2021-01-05 NOTE — Patient Instructions (Signed)

## 2021-01-05 NOTE — Progress Notes (Signed)
Subjective:  Patient ID: Kenneth Sellers, male    DOB: 12-Mar-1955  Age: 65 y.o. MRN: [id number]  CC: Annual Exam and Hypertension  This visit occurred during the SARS-CoV-2 public health emergency.  Safety protocols were in place, including screening questions prior to the visit, additional usage of staff PPE, and extensive cleaning of exam room while observing appropriate contact time as indicated for disinfecting solutions.    HPI Kenneth Sellers presents for a CPX and f/up -   He has intentionally lost weight with lifestyle modifications.  He exercises on an elliptical and a recumbent bike.  He does not experience chest pain, shortness of breath, diaphoresis, dizziness, lightheadedness, or edema.  Outpatient Medications Prior to Visit  Medication Sig Dispense Refill   albuterol (PROAIR HFA) 108 (90 Base) MCG/ACT inhaler Inhale 1-2 puffs into the lungs every 4-6 hours as needed for cough/wheeze 18 g 0   albuterol (VENTOLIN HFA) 108 (90 Base) MCG/ACT inhaler Inhale 1-2 puffs into the lungs every 6 (six) hours as needed for wheezing or shortness of breath. 18 g 5   aliskiren (TEKTURNA) 150 MG tablet TAKE 1 TABLET BY MOUTH ONCE DAILY 90 tablet 1   AMBULATORY NON FORMULARY MEDICATION CPAP machine nightly     clotrimazole-betamethasone (LOTRISONE) cream Apply a thin layer to the affected area twice per day for 3 weeks. 60 g 0   EPINEPHrine 0.3 mg/0.3 mL IJ SOAJ injection Inject 0.3 mg into the skin once as needed for anaphylaxis.     EPINEPHrine 0.3 mg/0.3 mL IJ SOAJ injection Inject 1 pen as directed as needed for systemic reactions 1 each 1   ezetimibe-simvastatin (VYTORIN) 10-20 MG tablet TAKE 1 TABLET BY MOUTH EVERY NIGHT AT BEDTIME 90 tablet 1   fluticasone (FLONASE) 50 MCG/ACT nasal spray Place 2 sprays into both nostrils daily. (Patient taking differently: Place 2 sprays into both nostrils daily as needed for allergies.) 48 g 1   fluticasone furoate-vilanterol (BREO ELLIPTA) 200-25 MCG/ACT  AEPB Inhale 1 puff by mouth once a day 60 each 3   gabapentin (NEURONTIN) 100 MG capsule TAKE 1 CAPSULE BY MOUTH 3 TIMES DAILY 270 capsule 1   levocetirizine (XYZAL) 5 MG tablet Take 1 tablet (5 mg total) by mouth daily as needed. (Patient taking differently: Take 5 mg by mouth every evening.) 90 tablet 1   METAMUCIL FIBER PO Take 1 Dose by mouth as needed.     montelukast (SINGULAIR) 10 MG tablet Take 1 tablet (10 mg total) by mouth daily. 90 tablet 2   Multiple Vitamins-Minerals (MULTIVITAMIN ADULTS PO) Take 1 tablet by mouth daily.      Probiotic Product (RESTORA) CAPS TAKE 1 CAPSULE BY MOUTH DAILY. 90 capsule 1   tadalafil (CIALIS) 20 MG tablet Take 1 tablet by mouth if needed daily as directed. 20 tablet 11   ALISKIREN FUMARATE PO      COVID-19 mRNA bivalent vaccine, Pfizer, (PFIZER COVID-19 VAC BIVALENT) injection Inject into the muscle. 0.3 mL 0   influenza vaccine adjuvanted (FLUAD QUADRIVALENT) 0.5 ML injection Inject into the muscle. 0.5 mL 0   levocetirizine (XYZAL) 5 MG tablet Take 1 tablet (5 mg total) by mouth daily. 90 tablet 2   montelukast (SINGULAIR) 10 MG tablet TAKE 1 TABLET BY MOUTH ONCE DAILY IN THE EVENING 90 tablet 1   sildenafil (REVATIO) 20 MG tablet Take 2 tablets by mouth every other day as needed one hour prior to activity on an empty stomach 30 tablet 6  No facility-administered medications prior to visit.    ROS Review of Systems  Constitutional:  Negative for chills, diaphoresis, fatigue and unexpected weight change.  HENT: Negative.    Eyes: Negative.   Respiratory:  Negative for cough, chest tightness, shortness of breath and wheezing.   Cardiovascular:  Negative for chest pain, palpitations and leg swelling.  Gastrointestinal:  Negative for abdominal pain, constipation, diarrhea, nausea and vomiting.  Endocrine: Negative.   Genitourinary: Negative.  Negative for difficulty urinating.  Musculoskeletal:  Negative for arthralgias and myalgias.  Skin:  Negative.   Neurological:  Negative for dizziness, weakness and light-headedness.  Hematological:  Negative for adenopathy. Does not bruise/bleed easily.  Psychiatric/Behavioral: Negative.     Objective:  BP 132/74 (BP Location: Right Arm, Patient Position: Sitting, Cuff Size: Large)   Pulse 67   Temp 97.8 F (36.6 C) (Oral)   Resp 16   Ht 5' 9.5" (1.765 m)   Wt 171 lb (77.6 kg)   SpO2 97%   BMI 24.89 kg/m   BP Readings from Last 3 Encounters:  01/05/21 132/74  06/30/20 (!) 142/86  05/02/20 138/68    Wt Readings from Last 3 Encounters:  01/05/21 171 lb (77.6 kg)  06/30/20 197 lb (89.4 kg)  05/02/20 199 lb (90.3 kg)    Physical Exam Vitals reviewed.  HENT:     Nose: Nose normal.     Mouth/Throat:     Mouth: Mucous membranes are moist.  Eyes:     General: No scleral icterus.    Conjunctiva/sclera: Conjunctivae normal.  Cardiovascular:     Rate and Rhythm: Normal rate and regular rhythm.     Heart sounds: Normal heart sounds, S1 normal and S2 normal. No murmur heard.    Comments: EKG- NSR, 60 bpm Normal EKG Pulmonary:     Effort: Pulmonary effort is normal.     Breath sounds: No stridor. No wheezing, rhonchi or rales.  Abdominal:     General: Abdomen is flat.     Palpations: There is no mass.     Tenderness: There is no abdominal tenderness. There is no guarding.     Hernia: No hernia is present.  Genitourinary:    Comments: DRE deferred - he had this done recently by urology Musculoskeletal:        General: No swelling.     Cervical back: Neck supple.     Right lower leg: No edema.     Left lower leg: No edema.  Lymphadenopathy:     Cervical: No cervical adenopathy.  Skin:    General: Skin is warm and dry.     Coloration: Skin is not pale.  Neurological:     General: No focal deficit present.     Mental Status: He is alert.  Psychiatric:        Mood and Affect: Mood normal.        Behavior: Behavior normal.    Lab Results  Component Value Date    WBC 4.9 01/05/2021   HGB 15.7 01/05/2021   HCT 46.3 01/05/2021   PLT 192.0 01/05/2021   GLUCOSE 95 01/05/2021   CHOL 160 01/05/2021   TRIG 81.0 01/05/2021   HDL 61.10 01/05/2021   LDLCALC 83 01/05/2021   ALT 23 05/02/2020   AST 21 05/02/2020   NA 140 01/05/2021   K 4.4 01/05/2021   CL 101 01/05/2021   CREATININE 0.94 01/05/2021   BUN 15 01/05/2021   CO2 31 01/05/2021   TSH 3.16 01/05/2021  PSA 0.33 01/05/2021   HGBA1C 5.7 12/31/2019    CT ABDOMEN PELVIS W CONTRAST  Result Date: 03/26/2019 CLINICAL DATA:  Abdominal pain and distension.  Dysuria. EXAM: CT ABDOMEN AND PELVIS WITH CONTRAST TECHNIQUE: Multidetector CT imaging of the abdomen and pelvis was performed using the standard protocol following bolus administration of intravenous contrast. CONTRAST:  188mL OMNIPAQUE IOHEXOL 300 MG/ML  SOLN COMPARISON:  October 27, 2015. FINDINGS: Lower chest: There is slight atelectatic change in the left base. No lung base edema or consolidation. Hepatobiliary: There is borderline hepatic steatosis. No focal liver lesions are evident. The gallbladder wall is not appreciably thickened. There is no biliary duct dilatation. Pancreas: There is no pancreatic mass or inflammatory focus. Spleen: No splenic lesions are evident. Adrenals/Urinary Tract: Adrenals bilaterally appear normal. There is a cyst arising from the medial upper pole of the left kidney measuring 1.8 x 1.2 cm. There is slight hydronephrosis on the left. There is no hydronephrosis on the right. There is no intrarenal calculus on either side. There is a 3 x 3 mm calculus at the left ureterovesical junction. No other ureteral calculi are evident. Urinary bladder is midline with wall thickness within normal limits. Stomach/Bowel: There are surgical clips in the left upper abdomen near the stomach consistent with previous Nissen fundoplication type procedure. There is no bowel wall thickening or fluid in this area of postoperative change.  Elsewhere, there is no appreciable bowel wall or mesenteric thickening. There are occasional sigmoid diverticula without diverticulitis. There is no evident bowel obstruction. The terminal ileum appears normal. There is no evident free air or portal venous air. Vascular/Lymphatic: No abdominal aortic aneurysm. There is atherosclerotic calcification in the aorta and common iliac arteries. Major venous structures appear patent. There is no evident adenopathy in the abdomen or pelvis. Reproductive: There is minimal prostatic calcification. Prostate and seminal vesicles are normal in size and contour. No evident pelvic mass. Other: The appendix appears unremarkable. No evident abscess or ascites in the abdomen or pelvis. There is a small ventral hernia immediately to the right of midline in the mid abdominal region containing fat but no bowel. There is muscle thinning at the umbilicus anteriorly. Musculoskeletal: Slight anterior wedging at L2 is stable. Scattered areas of degenerative change in the lumbar and lower thoracic spine. No blastic or lytic bone lesions. No intramuscular lesion evident. IMPRESSION: 1. 3 x 3 mm calculus at the left ureterovesical junction with mild hydronephrosis on the left. 2. Postoperative change in the perigastric region. No inflammatory focus in this area. No bowel obstruction. No abscess in the abdomen or pelvis. Appendix appears normal. There are occasional sigmoid diverticula without diverticulitis. 3.  Borderline hepatic steatosis. 4. Ventral hernia immediately to the right of midline in the mid abdomen containing fat but no bowel. Electronically Signed   By: Lowella Grip III M.D.   On: 03/26/2019 13:24   DG Abdomen Acute W/Chest  Result Date: 03/26/2019 CLINICAL DATA:  Left lower quadrant pain EXAM: DG ABDOMEN ACUTE W/ 1V CHEST COMPARISON:  None. FINDINGS: Cardiac shadow is within normal limits. The lungs are well aerated bilaterally. No acute bony abnormality is seen.  Scattered large and small bowel gas is noted. No free air is seen. Postsurgical changes are noted in the left upper quadrant. No abnormal mass or abnormal calcifications are noted. No acute bony abnormality is seen. Mild degenerative changes of the lumbar spine are noted. IMPRESSION: No free air is seen. No acute abnormality in the chest and abdomen. Electronically  Signed   By: Inez Catalina M.D.   On: 03/26/2019 12:56    Assessment & Plan:   Satchel was seen today for annual exam and hypertension.  Diagnoses and all orders for this visit:  Encounter for general adult medical examination with abnormal findings- Exam completed, labs reviewed, vaccines reviewed and updated, cancer screenings are up-to-date, patient education was given. -     Lipid panel; Future -     PSA; Future -     PSA -     Lipid panel  Hyperlipidemia with target LDL less than 130- LDL goal achieved. Doing well on the statin  -     TSH; Future -     TSH  Hyperglycemia- His blood sugar is normal now. -     CBC with Differential/Platelet; Future -     Basic metabolic panel; Future -     Basic metabolic panel -     CBC with Differential/Platelet  Primary hypertension- His blood pressure is adequately well controlled.  Labs and EKG are negative for secondary causes or endorgan damage. -     CBC with Differential/Platelet; Future -     Basic metabolic panel; Future -     EKG 12-Lead -     Basic metabolic panel -     CBC with Differential/Platelet  Other orders -     Varicella-zoster vaccine IM (Shingrix)  I have discontinued Novah L. Wolak's Pfizer COVID-19 Vac Bivalent, Fluad Quadrivalent, and sildenafil. I am also having him maintain his AMBULATORY NON FORMULARY MEDICATION, fluticasone, levocetirizine, albuterol, Multiple Vitamins-Minerals (MULTIVITAMIN ADULTS PO), Restora, EPINEPHrine, METAMUCIL FIBER PO, gabapentin, EPINEPHrine, albuterol, montelukast, ezetimibe-simvastatin, aliskiren, clotrimazole-betamethasone,  tadalafil, and fluticasone furoate-vilanterol.  No orders of the defined types were placed in this encounter.    Follow-up: Return in about 6 months (around 07/05/2021).  Scarlette Calico, MD

## 2021-01-07 DIAGNOSIS — G4733 Obstructive sleep apnea (adult) (pediatric): Secondary | ICD-10-CM | POA: Diagnosis not present

## 2021-01-14 ENCOUNTER — Other Ambulatory Visit: Payer: Self-pay

## 2021-01-14 ENCOUNTER — Encounter (HOSPITAL_BASED_OUTPATIENT_CLINIC_OR_DEPARTMENT_OTHER): Payer: Self-pay | Admitting: Urology

## 2021-01-14 DIAGNOSIS — J3081 Allergic rhinitis due to animal (cat) (dog) hair and dander: Secondary | ICD-10-CM | POA: Diagnosis not present

## 2021-01-14 DIAGNOSIS — N471 Phimosis: Secondary | ICD-10-CM

## 2021-01-14 DIAGNOSIS — J301 Allergic rhinitis due to pollen: Secondary | ICD-10-CM | POA: Diagnosis not present

## 2021-01-14 DIAGNOSIS — J3089 Other allergic rhinitis: Secondary | ICD-10-CM | POA: Diagnosis not present

## 2021-01-14 HISTORY — DX: Phimosis: N47.1

## 2021-01-14 NOTE — Progress Notes (Addendum)
Spoke w/ via phone for pre-op interview---pt Lab needs dos---- I stat              Lab results------ekg 01-05-2021 chart/epic, chest xray 05-02-2020 epic COVID test -----patient states asymptomatic no test needed Arrive at -------745 am 01-19-2021 NPO after MN NO Solid Food.  Clear liquids from MN until---645 am Med rec completed Medications to take morning of surgery ----- breo ellipta inhaler, albuterol inhaler prn/bring inhaler, flonase prn Diabetic medication -----n/a  of surgery Patient aware to have Driver (ride ) / caregiver    for 24 hours after surgery wife Bevely Palmer Patient Special Instructions -----bring cpap mask tubing  and  machine and leave in car Pre-Op special Istructions -----none Patient verbalized understanding of instructions that were given at this phone interview. Patient denies shortness of breath, chest pain, fever, cough at this phone interview.

## 2021-01-15 DIAGNOSIS — R6882 Decreased libido: Secondary | ICD-10-CM | POA: Diagnosis not present

## 2021-01-19 ENCOUNTER — Ambulatory Visit (HOSPITAL_BASED_OUTPATIENT_CLINIC_OR_DEPARTMENT_OTHER): Payer: 59 | Admitting: Certified Registered"

## 2021-01-19 ENCOUNTER — Other Ambulatory Visit (HOSPITAL_COMMUNITY): Payer: Self-pay

## 2021-01-19 ENCOUNTER — Encounter (HOSPITAL_BASED_OUTPATIENT_CLINIC_OR_DEPARTMENT_OTHER): Payer: Self-pay | Admitting: Urology

## 2021-01-19 ENCOUNTER — Encounter (HOSPITAL_BASED_OUTPATIENT_CLINIC_OR_DEPARTMENT_OTHER)
Admission: RE | Disposition: A | Discharge status: Home / Self Care | Payer: Self-pay | Source: Ambulatory Visit | Attending: Urology

## 2021-01-19 ENCOUNTER — Ambulatory Visit (HOSPITAL_BASED_OUTPATIENT_CLINIC_OR_DEPARTMENT_OTHER)
Admission: RE | Admit: 2021-01-19 | Discharge: 2021-01-19 | Disposition: A | Discharge status: Home / Self Care | Payer: 59 | Source: Ambulatory Visit | Attending: Urology | Admitting: Urology

## 2021-01-19 DIAGNOSIS — E785 Hyperlipidemia, unspecified: Secondary | ICD-10-CM | POA: Diagnosis not present

## 2021-01-19 DIAGNOSIS — N481 Balanitis: Secondary | ICD-10-CM | POA: Diagnosis not present

## 2021-01-19 DIAGNOSIS — N471 Phimosis: Secondary | ICD-10-CM | POA: Diagnosis not present

## 2021-01-19 DIAGNOSIS — J4530 Mild persistent asthma, uncomplicated: Secondary | ICD-10-CM | POA: Diagnosis not present

## 2021-01-19 DIAGNOSIS — I10 Essential (primary) hypertension: Secondary | ICD-10-CM | POA: Diagnosis not present

## 2021-01-19 HISTORY — PX: CIRCUMCISION: SHX1350

## 2021-01-19 HISTORY — DX: Unspecified osteoarthritis, unspecified site: M19.90

## 2021-01-19 LAB — POCT I-STAT, CHEM 8
BUN: 11 mg/dL (ref 8–23)
Calcium, Ion: 1.03 mmol/L — ABNORMAL LOW (ref 1.15–1.40)
Chloride: 102 mmol/L (ref 98–111)
Creatinine, Ser: 0.8 mg/dL (ref 0.61–1.24)
Glucose, Bld: 111 mg/dL — ABNORMAL HIGH (ref 70–99)
HCT: 47 % (ref 39.0–52.0)
Hemoglobin: 16 g/dL (ref 13.0–17.0)
Potassium: 3.9 mmol/L (ref 3.5–5.1)
Sodium: 138 mmol/L (ref 135–145)
TCO2: 27 mmol/L (ref 22–32)

## 2021-01-19 SURGERY — CIRCUMCISION, ADULT
Anesthesia: General | Site: Penis

## 2021-01-19 MED ORDER — PROPOFOL 10 MG/ML IV BOLUS
INTRAVENOUS | Status: DC | PRN
  Administered 2021-01-19: 160 mg via INTRAVENOUS

## 2021-01-19 MED ORDER — PROPOFOL 10 MG/ML IV BOLUS
INTRAVENOUS | Status: AC
  Filled 2021-01-19: qty 20

## 2021-01-19 MED ORDER — FENTANYL CITRATE (PF) 100 MCG/2ML IJ SOLN: 25.0000 ug | INTRAMUSCULAR | Status: DC | PRN

## 2021-01-19 MED ORDER — CEFAZOLIN SODIUM-DEXTROSE 2-4 GM/100ML-% IV SOLN
INTRAVENOUS | Status: AC
  Filled 2021-01-19: qty 100

## 2021-01-19 MED ORDER — FENTANYL CITRATE (PF) 100 MCG/2ML IJ SOLN
INTRAMUSCULAR | Status: DC | PRN
  Administered 2021-01-19: 50 ug via INTRAVENOUS

## 2021-01-19 MED ORDER — LIDOCAINE 2% (20 MG/ML) 5 ML SYRINGE
INTRAMUSCULAR | Status: AC
  Filled 2021-01-19: qty 5

## 2021-01-19 MED ORDER — FENTANYL CITRATE (PF) 100 MCG/2ML IJ SOLN
INTRAMUSCULAR | Status: AC
  Filled 2021-01-19: qty 2

## 2021-01-19 MED ORDER — ONDANSETRON HCL 4 MG/2ML IJ SOLN
INTRAMUSCULAR | Status: DC | PRN
  Administered 2021-01-19: 4 mg via INTRAVENOUS

## 2021-01-19 MED ORDER — WHITE PETROLATUM EX OINT
TOPICAL_OINTMENT | CUTANEOUS | Status: AC
  Filled 2021-01-19: qty 5

## 2021-01-19 MED ORDER — TRAMADOL HCL 50 MG PO TABS
50.0000 mg | ORAL_TABLET | Freq: Four times a day (QID) | ORAL | 0 refills | Status: DC | PRN
  Filled 2021-01-19: qty 10, 3d supply, fill #0

## 2021-01-19 MED ORDER — MIDAZOLAM HCL 2 MG/2ML IJ SOLN
INTRAMUSCULAR | Status: AC
  Filled 2021-01-19: qty 2

## 2021-01-19 MED ORDER — LIDOCAINE 2% (20 MG/ML) 5 ML SYRINGE
INTRAMUSCULAR | Status: DC | PRN
  Administered 2021-01-19: 100 mg via INTRAVENOUS

## 2021-01-19 MED ORDER — DEXAMETHASONE SODIUM PHOSPHATE 10 MG/ML IJ SOLN
INTRAMUSCULAR | Status: DC | PRN
  Administered 2021-01-19: 5 mg via INTRAVENOUS

## 2021-01-19 MED ORDER — PHENYLEPHRINE 40 MCG/ML (10ML) SYRINGE FOR IV PUSH (FOR BLOOD PRESSURE SUPPORT)
PREFILLED_SYRINGE | INTRAVENOUS | Status: AC
  Filled 2021-01-19: qty 10

## 2021-01-19 MED ORDER — ONDANSETRON HCL 4 MG/2ML IJ SOLN
INTRAMUSCULAR | Status: AC
  Filled 2021-01-19: qty 2

## 2021-01-19 MED ORDER — MIDAZOLAM HCL 2 MG/2ML IJ SOLN
INTRAMUSCULAR | Status: DC | PRN
  Administered 2021-01-19: 2 mg via INTRAVENOUS

## 2021-01-19 MED ORDER — PHENYLEPHRINE 40 MCG/ML (10ML) SYRINGE FOR IV PUSH (FOR BLOOD PRESSURE SUPPORT)
PREFILLED_SYRINGE | INTRAVENOUS | Status: DC | PRN
  Administered 2021-01-19: 80 ug via INTRAVENOUS

## 2021-01-19 MED ORDER — LACTATED RINGERS IV SOLN: INTRAVENOUS | Status: DC

## 2021-01-19 MED ORDER — DEXAMETHASONE SODIUM PHOSPHATE 10 MG/ML IJ SOLN
INTRAMUSCULAR | Status: AC
  Filled 2021-01-19: qty 1

## 2021-01-19 MED ORDER — BUPIVACAINE HCL 0.25 % IJ SOLN
INTRAMUSCULAR | Status: DC | PRN
  Administered 2021-01-19: 17 mL

## 2021-01-19 MED ORDER — 0.9 % SODIUM CHLORIDE (POUR BTL) OPTIME
TOPICAL | Status: DC | PRN
  Administered 2021-01-19: 500 mL

## 2021-01-19 MED ORDER — CEFAZOLIN SODIUM-DEXTROSE 2-4 GM/100ML-% IV SOLN
2.0000 g | INTRAVENOUS | Status: AC
  Administered 2021-01-19: 2 g via INTRAVENOUS

## 2021-01-19 SURGICAL SUPPLY — 30 items
BLADE SURG 15 STRL LF DISP TIS (BLADE) ×1 IMPLANT
BLADE SURG 15 STRL SS (BLADE) ×2
BNDG COHESIVE 1X5 TAN STRL LF (GAUZE/BANDAGES/DRESSINGS) ×2 IMPLANT
BNDG CONFORM 2 STRL LF (GAUZE/BANDAGES/DRESSINGS) ×2 IMPLANT
COVER BACK TABLE 60X90IN (DRAPES) ×2 IMPLANT
COVER MAYO STAND STRL (DRAPES) ×2 IMPLANT
DRAPE LAPAROTOMY 100X72 PEDS (DRAPES) ×2 IMPLANT
DRSG VASELINE 3X18 (GAUZE/BANDAGES/DRESSINGS) ×1 IMPLANT
ELECT NDL TIP 2.8 STRL (NEEDLE) IMPLANT
ELECT NEEDLE TIP 2.8 STRL (NEEDLE) IMPLANT
ELECT REM PT RETURN 9FT ADLT (ELECTROSURGICAL) ×2
ELECTRODE REM PT RTRN 9FT ADLT (ELECTROSURGICAL) ×1 IMPLANT
GAUZE 4X4 16PLY ~~LOC~~+RFID DBL (SPONGE) ×2 IMPLANT
GAUZE PETROLATUM 1 X8 (GAUZE/BANDAGES/DRESSINGS) ×1 IMPLANT
GLOVE SURG ENC MOIS LTX SZ8 (GLOVE) ×2 IMPLANT
GOWN STRL REUS W/TWL LRG LVL3 (GOWN DISPOSABLE) ×2 IMPLANT
KIT TURNOVER CYSTO (KITS) ×2 IMPLANT
MANIFOLD NEPTUNE II (INSTRUMENTS) IMPLANT
NEEDLE HYPO 22GX1.5 SAFETY (NEEDLE) ×2 IMPLANT
NS IRRIG 500ML POUR BTL (IV SOLUTION) ×2 IMPLANT
PACK BASIN DAY SURGERY FS (CUSTOM PROCEDURE TRAY) ×2 IMPLANT
PENCIL SMOKE EVACUATOR (MISCELLANEOUS) ×2 IMPLANT
SOL PREP POV-IOD 4OZ 10% (MISCELLANEOUS) ×1 IMPLANT
SUT CHROMIC 4 0 RB 1X27 (SUTURE) ×3 IMPLANT
SUT CHROMIC 5 0 RB 1 27 (SUTURE) IMPLANT
SYR CONTROL 10ML LL (SYRINGE) ×2 IMPLANT
TOWEL OR 17X26 10 PK STRL BLUE (TOWEL DISPOSABLE) ×2 IMPLANT
TRAY DSU PREP LF (CUSTOM PROCEDURE TRAY) ×2 IMPLANT
TUBE CONNECTING 12X1/4 (SUCTIONS) IMPLANT
WATER STERILE IRR 500ML POUR (IV SOLUTION) ×1 IMPLANT

## 2021-01-19 NOTE — Transfer of Care (Signed)
Immediate Anesthesia Transfer of Care Note  Patient: Kenneth Sellers  Procedure(s) Performed: Procedure(s) (LRB): CIRCUMCISION ADULT (N/A)  Patient Location: PACU  Anesthesia Type: General  Level of Consciousness: awake, oriented, sedated and patient cooperative  Airway & Oxygen Therapy: Patient Spontanous Breathing and Patient connected to face mask oxygen  Post-op Assessment: Report given to PACU RN and Post -op Vital signs reviewed and stable  Post vital signs: Reviewed and stable  Complications: No apparent anesthesia complications Last Vitals:  Vitals Value Taken Time  BP 114/78 01/19/21 1035  Temp    Pulse 65 01/19/21 1037  Resp 15 01/19/21 1037  SpO2 100 % 01/19/21 1037  Vitals shown include unvalidated device data.  Last Pain:  Vitals:   01/19/21 0813  TempSrc: Oral  PainSc: 5       Patients Stated Pain Goal: 5 (47/09/29 5747)  Complications: No notable events documented.

## 2021-01-19 NOTE — H&P (Signed)
H&P  Chief Complaint: Penile issues  History of Present Illness: 65 year old male presents at this time for circumcision.  He has symptomatic phimosis with occasional balanitis.  Past Medical History:  Diagnosis Date   Allergic rhinitis    Dr. Donneta Romberg   Allergy    Arthritis    hands   Asthma    Cataract    small immature left eye   Diverticulosis of colon    DJD (degenerative joint disease)    lower back   History of kidney stones 2020   x 1 passed on own   Hyperlipidemia    Hypertension    Neuromuscular disorder ([place])    essential tremors   OSA (obstructive sleep apnea)    PSG 08/26/02 AHI 59--CPAP 7 cm   Phimosis 01/14/2021   Pneumonia 1988   treated as OP   Sleep apnea    wears cpap    Tachycardia 2003   Dr. Mare Ferrari, negative cardiac work up    Past Surgical History:  Procedure Laterality Date   colon tics  03/2005   [facility]   COLONOSCOPY  03/2005   colonscopy  12/25/2018   EYE SURGERY Right 2020   right ioc lens replacement for cataract   lesion removed L neck; pigmented mole at hairline, non melanoma  02/7791   NISSEN FUNDOPLICATION  9030    Dr Margot Chimes; complicated by herniation post op with retching requiring 2nd surgery   POLYPECTOMY     RHINOPLASTY  1983    Home Medications:    Allergies: No Known Allergies  Family History  Problem Relation Age of Onset   Heart attack Father 60   Diabetes Mother    Hypertension Mother    Stroke Mother 49   Heart failure Mother    Diabetes Sister    Allergies Sister    Hypertension Brother    Heart disease Brother    Diabetes Brother        2 brothers   Heart attack Brother 3       smoker   COPD Brother        died in his 75s   Arthritis Sister    Cancer Neg Hx    Early death Neg Hx    Hyperlipidemia Neg Hx    Kidney disease Neg Hx    Colon cancer Neg Hx    Colon polyps Neg Hx    Esophageal cancer Neg Hx    Rectal cancer Neg Hx    Stomach cancer Neg Hx     Social History:  reports that he  has never smoked. He has never used smokeless tobacco. He reports current alcohol use of about 2.0 standard drinks per week. He reports that he does not use drugs.  ROS: A complete review of systems was performed.  All systems are negative except for pertinent findings as noted.  Physical Exam:  Vital signs in last 24 hours: BP (!) 148/84   Pulse 79   Temp 97.8 F (36.6 C) (Oral)   Resp 17   Ht 5' 9.5" (1.765 m)   Wt 77.3 kg   SpO2 100%   BMI 24.80 kg/m  Constitutional:  Alert and oriented, No acute distress Cardiovascular: Regular rate  Respiratory: Normal respiratory effort GI: Abdomen is soft, nontender, nondistended, no abdominal masses. No CVAT. GU: Phallus uncircumcised, phimosis present.  Testes are descended bilaterally and non-tender and without masses, scrotum is normal in appearance without lesions or masses, perineum is normal on inspection. Lymphatic: No  lymphadenopathy Neurologic: Grossly intact, no focal deficits Psychiatric: Normal mood and affect  I have reviewed prior pt notes  I have reviewed prior urine culture   Impression/Assessment:  Phimosis  Plan:  Circumcision

## 2021-01-19 NOTE — Anesthesia Procedure Notes (Signed)
Procedure Name: LMA Insertion Date/Time: 01/19/2021 9:53 AM Performed by: Suan Halter, CRNA Pre-anesthesia Checklist: Patient identified, Emergency Drugs available, Suction available and Patient being monitored Patient Re-evaluated:Patient Re-evaluated prior to induction Oxygen Delivery Method: Circle system utilized Preoxygenation: Pre-oxygenation with 100% oxygen Induction Type: IV induction Ventilation: Mask ventilation without difficulty LMA: LMA inserted LMA Size: 4.0 Number of attempts: 1 Airway Equipment and Method: Bite block Placement Confirmation: positive ETCO2 Tube secured with: Tape Dental Injury: Teeth and Oropharynx as per pre-operative assessment

## 2021-01-19 NOTE — Anesthesia Postprocedure Evaluation (Signed)
Anesthesia Post Note  Patient: Kenneth Sellers  Procedure(s) Performed: CIRCUMCISION ADULT (Penis)     Patient location during evaluation: PACU Anesthesia Type: General Level of consciousness: awake Pain management: pain level controlled Vital Signs Assessment: post-procedure vital signs reviewed and stable Respiratory status: spontaneous breathing Cardiovascular status: stable Postop Assessment: no apparent nausea or vomiting Anesthetic complications: no   No notable events documented.  Last Vitals:  Vitals:   01/19/21 1115 01/19/21 1145  BP: 136/84 (!) 143/79  Pulse: (!) 59 (!) 54  Resp: 10 12  Temp:  (!) 36.3 C  SpO2: 98% 98%    Last Pain:  Vitals:   01/19/21 1145  TempSrc:   PainSc: 0-No pain                 Zaiah Credeur

## 2021-01-19 NOTE — Discharge Instructions (Addendum)
Postoperative instructions for circumcision  Wound:  Remove the dressing 2 days after surgery. In most cases your incision will have absorbable sutures that run along the course of your incision and will dissolve within the first 10-20 days. Some will fall out even earlier. Expect some redness as the sutures dissolved but this should occur only around the sutures. If there is generalized redness, especially with increasing pain or swelling, let us know. The penis will possibly get "black and blue" as the blood in the tissues spread. Sometimes the whole penis will turn colors. The black and blue is followed by a yellow and brown color. In time, all the discoloration will go away.  Diet:  You may return to your normal diet within 24 hours following your surgery. You may note some mild nausea and possibly vomiting the first 6-8 hours following surgery. This is usually due to the side effects of anesthesia, and will disappear quite soon. I would suggest clear liquids and a very light meal the first evening following your surgery.  Activity:  Your physical activity should be restricted the first 48 hours. During that time you should remain relatively inactive, moving about only when necessary. During the first 7-10 days following surgery he should avoid lifting any heavy objects (anything greater than 15 pounds), and avoid strenuous exercise. If you work, ask [place] specifically about your restrictions, both for work and home. We will write a note to your employer if needed.  Ice packs can be placed on and off over the penis for the first 48 hours to help relieve the pain and keep the swelling down. Frozen peas or corn in a ZipLock bag can be frozen, used and re-frozen. Fifteen minutes on and 15 minutes off is a reasonable schedule.  No sexual activity for 1 month.  Hygiene:  You may shower 24 hours after your surgery. Make sure wound is clean and dry afterwards. Tub bathing should be restricted until the  seventh day.  Medication:  You will be sent home with some type of pain medication. In many cases you will be sent home with a narcotic pain pill (Vicodin or Tylox). If the pain is not too bad, you may take either Tylenol (acetaminophen) or Advil (ibuprofen) which contain no narcotic agents, and might be tolerated a little better, with fewer side effects. If the pain medication you are sent home with does not control the pain, you will have to let us know. Some narcotic pain medications cannot be given or refilled by a phone call to a pharmacy.  Problems you should report to [place]:  Fever of 101.0 degrees Fahrenheit or greater. Moderate or severe swelling under the skin incision or involving the scrotum. Drug reaction such as hives, a rash, nausea or vomiting.  Difficulty voiding       Post Anesthesia Home Care Instructions  Activity: Get plenty of rest for the remainder of the day. A responsible individual must stay with you for 24 hours following the procedure.  For the next 24 hours, DO NOT: -Drive a car -Paediatric nurse -Drink alcoholic beverages -Take any medication unless instructed by your physician -Make any legal decisions or sign important papers.  Meals: Start with liquid foods such as gelatin or soup. Progress to regular foods as tolerated. Avoid greasy, spicy, heavy foods. If nausea and/or vomiting occur, drink only clear liquids until the nausea and/or vomiting subsides. Call your physician if vomiting continues.  Special Instructions/Symptoms: Your throat may feel dry or sore from  the anesthesia or the breathing tube placed in your throat during surgery. If this causes discomfort, gargle with warm salt water. The discomfort should disappear within 24 hours.

## 2021-01-19 NOTE — Anesthesia Preprocedure Evaluation (Signed)
Anesthesia Evaluation  Patient identified by MRN, date of birth, ID band Patient awake    Reviewed: Allergy & Precautions, NPO status , Patient's Chart, lab work & pertinent test results  Airway Mallampati: II  TM Distance: >3 FB     Dental   Pulmonary asthma , sleep apnea , pneumonia,    breath sounds clear to auscultation       Cardiovascular hypertension,  Rhythm:Regular Rate:Normal     Neuro/Psych  Neuromuscular disease    GI/Hepatic negative GI ROS, Neg liver ROS,   Endo/Other  negative endocrine ROS  Renal/GU negative Renal ROS     Musculoskeletal   Abdominal   Peds  Hematology   Anesthesia Other Findings   Reproductive/Obstetrics                             Anesthesia Physical Anesthesia Plan  ASA: 3  Anesthesia Plan: General   Post-op Pain Management:    Induction:   PONV Risk Score and Plan: 2 and Ondansetron and Midazolam  Airway Management Planned: LMA  Additional Equipment:   Intra-op Plan:   Post-operative Plan: Extubation in OR  Informed Consent: I have reviewed the patients History and Physical, chart, labs and discussed the procedure including the risks, benefits and alternatives for the proposed anesthesia with the patient or authorized representative who has indicated his/her understanding and acceptance.     Dental advisory given  Plan Discussed with: CRNA and Anesthesiologist  Anesthesia Plan Comments:         Anesthesia Quick Evaluation

## 2021-01-19 NOTE — Op Note (Signed)
Preoperative diagnosis: Phimosis Postoperative diagnosis: Same  Procedure: Circumcision Surgeon: Lillette Boxer. Tavi Hoogendoorn, MD. Anesthesia: Gen. with penile block Specimen: foreskin Complications: none [id number]  Indications: The patient was recently evaluated for phimosis. All risks and benefits of circumcision discussed. Full informed consent obtained. The patient now presents for definitive procedure.  Technique and findings: The patient was brought to the operating room. Successful induction of general anesthesia.  The patient was then prepped and draped in usual manner. Appropriate surgical timeout was performed. A penile block was then performed with 17cc of quarter percent plain Marcaine. Proximal and distal incision sites were marked with a pen, and appropriate circumferential incisions were created. The sleeve of redundant skin was removed with the bovie. Hemostatis was achieved using electrocautery.Quadrant sutures were placed with interrupted 4-0 chromic, with the phrenular suture being a "U" stitch. In between, the same 4-0 chromic was used to re approximate the skin edges with a running simple stitch.  The incision was wrapped with Xeroform gauze, a plain guaze wrap and Coban dressing. The patient was brought to recovery room in stable condition having had no obvious complications or problems. Sponge and needle counts were correct.

## 2021-01-20 ENCOUNTER — Encounter (HOSPITAL_BASED_OUTPATIENT_CLINIC_OR_DEPARTMENT_OTHER): Payer: Self-pay | Admitting: Urology

## 2021-01-21 ENCOUNTER — Other Ambulatory Visit (HOSPITAL_COMMUNITY): Payer: Self-pay

## 2021-01-21 MED ORDER — LEVOCETIRIZINE DIHYDROCHLORIDE 5 MG PO TABS
5.0000 mg | ORAL_TABLET | Freq: Every day | ORAL | 1 refills | Status: DC
  Filled 2021-01-21: qty 90, 90d supply, fill #0
  Filled 2021-05-01: qty 90, 90d supply, fill #1

## 2021-01-22 ENCOUNTER — Other Ambulatory Visit (HOSPITAL_COMMUNITY): Payer: Self-pay

## 2021-01-28 ENCOUNTER — Other Ambulatory Visit (HOSPITAL_COMMUNITY): Payer: Self-pay

## 2021-01-28 DIAGNOSIS — J3081 Allergic rhinitis due to animal (cat) (dog) hair and dander: Secondary | ICD-10-CM | POA: Diagnosis not present

## 2021-01-28 DIAGNOSIS — J3089 Other allergic rhinitis: Secondary | ICD-10-CM | POA: Diagnosis not present

## 2021-01-28 DIAGNOSIS — J301 Allergic rhinitis due to pollen: Secondary | ICD-10-CM | POA: Diagnosis not present

## 2021-01-28 MED ORDER — FLUTICASONE PROPIONATE 50 MCG/ACT NA SUSP
NASAL | 1 refills | Status: AC
  Filled 2021-01-28: qty 48, 90d supply, fill #0

## 2021-01-30 ENCOUNTER — Other Ambulatory Visit (HOSPITAL_COMMUNITY): Payer: Self-pay

## 2021-01-30 MED ORDER — ALBUTEROL SULFATE HFA 108 (90 BASE) MCG/ACT IN AERS
INHALATION_SPRAY | RESPIRATORY_TRACT | 0 refills | Status: DC
  Filled 2021-01-30: qty 18, 25d supply, fill #0

## 2021-02-05 DIAGNOSIS — J301 Allergic rhinitis due to pollen: Secondary | ICD-10-CM | POA: Diagnosis not present

## 2021-02-05 DIAGNOSIS — J3089 Other allergic rhinitis: Secondary | ICD-10-CM | POA: Diagnosis not present

## 2021-02-05 DIAGNOSIS — J3081 Allergic rhinitis due to animal (cat) (dog) hair and dander: Secondary | ICD-10-CM | POA: Diagnosis not present

## 2021-02-05 DIAGNOSIS — N481 Balanitis: Secondary | ICD-10-CM | POA: Diagnosis not present

## 2021-02-06 ENCOUNTER — Other Ambulatory Visit (HOSPITAL_COMMUNITY): Payer: Self-pay

## 2021-02-19 ENCOUNTER — Other Ambulatory Visit (HOSPITAL_COMMUNITY): Payer: Self-pay

## 2021-02-19 DIAGNOSIS — J3089 Other allergic rhinitis: Secondary | ICD-10-CM | POA: Diagnosis not present

## 2021-02-19 DIAGNOSIS — J3081 Allergic rhinitis due to animal (cat) (dog) hair and dander: Secondary | ICD-10-CM | POA: Diagnosis not present

## 2021-02-19 DIAGNOSIS — J301 Allergic rhinitis due to pollen: Secondary | ICD-10-CM | POA: Diagnosis not present

## 2021-03-05 DIAGNOSIS — J301 Allergic rhinitis due to pollen: Secondary | ICD-10-CM | POA: Diagnosis not present

## 2021-03-05 DIAGNOSIS — J3081 Allergic rhinitis due to animal (cat) (dog) hair and dander: Secondary | ICD-10-CM | POA: Diagnosis not present

## 2021-03-05 DIAGNOSIS — J3089 Other allergic rhinitis: Secondary | ICD-10-CM | POA: Diagnosis not present

## 2021-03-11 ENCOUNTER — Other Ambulatory Visit: Payer: Self-pay | Admitting: Internal Medicine

## 2021-03-11 ENCOUNTER — Other Ambulatory Visit (HOSPITAL_COMMUNITY): Payer: Self-pay

## 2021-03-11 DIAGNOSIS — E785 Hyperlipidemia, unspecified: Secondary | ICD-10-CM

## 2021-03-11 DIAGNOSIS — G250 Essential tremor: Secondary | ICD-10-CM

## 2021-03-11 DIAGNOSIS — L280 Lichen simplex chronicus: Secondary | ICD-10-CM

## 2021-03-11 MED ORDER — EZETIMIBE-SIMVASTATIN 10-20 MG PO TABS
1.0000 | ORAL_TABLET | Freq: Every day | ORAL | 1 refills | Status: DC
  Filled 2021-03-11: qty 90, 90d supply, fill #0
  Filled 2021-06-08: qty 90, 90d supply, fill #1

## 2021-03-11 MED ORDER — GABAPENTIN 100 MG PO CAPS
ORAL_CAPSULE | Freq: Three times a day (TID) | ORAL | 1 refills | Status: DC
  Filled 2021-03-11: qty 270, 90d supply, fill #0
  Filled 2021-07-26: qty 270, 90d supply, fill #1

## 2021-03-13 DIAGNOSIS — J3089 Other allergic rhinitis: Secondary | ICD-10-CM | POA: Diagnosis not present

## 2021-03-13 DIAGNOSIS — J3081 Allergic rhinitis due to animal (cat) (dog) hair and dander: Secondary | ICD-10-CM | POA: Diagnosis not present

## 2021-03-13 DIAGNOSIS — J301 Allergic rhinitis due to pollen: Secondary | ICD-10-CM | POA: Diagnosis not present

## 2021-03-17 ENCOUNTER — Other Ambulatory Visit (HOSPITAL_COMMUNITY): Payer: Self-pay

## 2021-03-17 DIAGNOSIS — J301 Allergic rhinitis due to pollen: Secondary | ICD-10-CM | POA: Diagnosis not present

## 2021-03-17 DIAGNOSIS — J3081 Allergic rhinitis due to animal (cat) (dog) hair and dander: Secondary | ICD-10-CM | POA: Diagnosis not present

## 2021-03-17 DIAGNOSIS — J3089 Other allergic rhinitis: Secondary | ICD-10-CM | POA: Diagnosis not present

## 2021-03-26 ENCOUNTER — Other Ambulatory Visit (HOSPITAL_COMMUNITY): Payer: Self-pay

## 2021-03-27 ENCOUNTER — Other Ambulatory Visit (HOSPITAL_COMMUNITY): Payer: Self-pay

## 2021-04-09 ENCOUNTER — Ambulatory Visit (INDEPENDENT_AMBULATORY_CARE_PROVIDER_SITE_OTHER): Payer: Medicare PPO | Admitting: *Deleted

## 2021-04-09 ENCOUNTER — Other Ambulatory Visit: Payer: Self-pay

## 2021-04-09 DIAGNOSIS — Z23 Encounter for immunization: Secondary | ICD-10-CM

## 2021-04-09 NOTE — Progress Notes (Signed)
Patient here for his 2nd shingles. Given in left deltoid. Patient tolerated well  ? ?Please co sign  ?

## 2021-05-01 ENCOUNTER — Other Ambulatory Visit: Payer: Self-pay

## 2021-05-01 ENCOUNTER — Other Ambulatory Visit: Payer: Self-pay | Admitting: Internal Medicine

## 2021-05-01 ENCOUNTER — Other Ambulatory Visit (HOSPITAL_COMMUNITY): Payer: Self-pay

## 2021-05-01 DIAGNOSIS — I10 Essential (primary) hypertension: Secondary | ICD-10-CM

## 2021-05-01 MED ORDER — ALISKIREN FUMARATE 150 MG PO TABS
ORAL_TABLET | Freq: Every day | ORAL | 1 refills | Status: DC
  Filled 2021-05-01: qty 90, 90d supply, fill #0
  Filled 2021-07-26: qty 90, 90d supply, fill #1

## 2021-05-01 MED ORDER — FLUTICASONE FUROATE-VILANTEROL 200-25 MCG/ACT IN AEPB
INHALATION_SPRAY | RESPIRATORY_TRACT | 3 refills | Status: DC
  Filled 2021-05-01: qty 60, 30d supply, fill #0
  Filled 2021-06-08: qty 60, 30d supply, fill #1
  Filled 2021-07-06: qty 60, 30d supply, fill #2
  Filled 2021-07-26: qty 60, 30d supply, fill #3

## 2021-05-01 MED ORDER — MONTELUKAST SODIUM 10 MG PO TABS
10.0000 mg | ORAL_TABLET | Freq: Every day | ORAL | 1 refills | Status: DC
  Filled 2021-05-01: qty 90, 90d supply, fill #0
  Filled 2021-07-26: qty 90, 90d supply, fill #1

## 2021-05-04 ENCOUNTER — Other Ambulatory Visit (HOSPITAL_COMMUNITY): Payer: Self-pay

## 2021-06-08 ENCOUNTER — Other Ambulatory Visit (HOSPITAL_COMMUNITY): Payer: Self-pay

## 2021-07-02 ENCOUNTER — Other Ambulatory Visit (HOSPITAL_COMMUNITY): Payer: Self-pay

## 2021-07-02 MED ORDER — DOXYCYCLINE HYCLATE 100 MG PO TABS
ORAL_TABLET | ORAL | 1 refills | Status: DC
  Filled 2021-07-02: qty 28, 14d supply, fill #0

## 2021-07-07 ENCOUNTER — Ambulatory Visit: Payer: Medicare PPO | Admitting: Internal Medicine

## 2021-07-07 ENCOUNTER — Other Ambulatory Visit (HOSPITAL_COMMUNITY): Payer: Self-pay

## 2021-07-07 ENCOUNTER — Encounter: Payer: Self-pay | Admitting: Internal Medicine

## 2021-07-07 VITALS — BP 136/84 | HR 85 | Temp 98.1°F | Resp 16 | Ht 69.5 in | Wt 176.0 lb

## 2021-07-07 DIAGNOSIS — L231 Allergic contact dermatitis due to adhesives: Secondary | ICD-10-CM | POA: Diagnosis not present

## 2021-07-07 DIAGNOSIS — I10 Essential (primary) hypertension: Secondary | ICD-10-CM

## 2021-07-07 MED ORDER — FLUOCINONIDE 0.05 % EX CREA
1.0000 "application " | TOPICAL_CREAM | Freq: Two times a day (BID) | CUTANEOUS | 0 refills | Status: DC
  Filled 2021-07-07 (×2): qty 30, 15d supply, fill #0

## 2021-07-07 NOTE — Progress Notes (Unsigned)
Subjective:  Patient ID: Kenneth Sellers, male    DOB: May 18, 1955  Age: 66 y.o. MRN: [id number]  CC: Rash and Hypertension   HPI Kenneth Sellers presents for f/up -  He had a cyst drained about 5 days ago at an urgent care center.  He is taking doxycycline and there is no more pain or swelling over the site.  He has been applying a bandage over the area and using a topical antibiotic ointment and complains of an itchy rash in the area.  Outpatient Medications Prior to Visit  Medication Sig Dispense Refill   albuterol (VENTOLIN HFA) 108 (90 Base) MCG/ACT inhaler Use 1 - 2 puffs as needed every 4 -  6 hours as needed for cough/wheeze 18 g 0   aliskiren (TEKTURNA) 150 MG tablet TAKE 1 TABLET BY MOUTH ONCE DAILY 90 tablet 1   AMBULATORY NON FORMULARY MEDICATION CPAP machine nightly     doxycycline (VIBRA-TABS) 100 MG tablet Take 1 tablet by mouth twice daily for 14 days. Take with food and water. Avoid Sunlight. 28 tablet 1   EPINEPHrine 0.3 mg/0.3 mL IJ SOAJ injection Inject 1 pen as directed as needed for systemic reactions 1 each 1   ezetimibe-simvastatin (VYTORIN) 10-20 MG tablet TAKE 1 TABLET BY MOUTH EVERY NIGHT AT BEDTIME 90 tablet 1   fluticasone (FLONASE) 50 MCG/ACT nasal spray Place 1 - 2 sprays in each nostril once a day 48 g 1   fluticasone furoate-vilanterol (BREO ELLIPTA) 200-25 MCG/ACT AEPB Inhale 1 puff into the lungs once a day 60 each 3   gabapentin (NEURONTIN) 100 MG capsule TAKE 1 CAPSULE BY MOUTH 3 TIMES DAILY 270 capsule 1   levocetirizine (XYZAL) 5 MG tablet Take 1 tablet (5 mg total) by mouth daily. 90 tablet 1   montelukast (SINGULAIR) 10 MG tablet Take 1 tablet (10 mg total) by mouth daily. 90 tablet 1   Multiple Vitamins-Minerals (MULTIVITAMIN ADULTS PO) Take 1 tablet by mouth daily.      Probiotic Product (RESTORA) CAPS TAKE 1 CAPSULE BY MOUTH DAILY. 90 capsule 1   tadalafil (CIALIS) 20 MG tablet Take 1 tablet by mouth if needed daily as directed. 20 tablet 11    traMADol (ULTRAM) 50 MG tablet Take 1 tablet (50 mg total) by mouth every 6 (six) hours as needed. 10 tablet 0   UNABLE TO FIND Allergy shots every 7 to 10 days with dr Donneta Romberg     albuterol (VENTOLIN HFA) 108 (90 Base) MCG/ACT inhaler Inhale 1-2 puffs into the lungs every 6 (six) hours as needed for wheezing or shortness of breath. 18 g 5   clotrimazole-betamethasone (LOTRISONE) cream Apply a thin layer to the affected area twice per day for 3 weeks. 60 g 0   EPINEPHrine 0.3 mg/0.3 mL IJ SOAJ injection Inject 0.3 mg into the skin once as needed for anaphylaxis.     fluticasone (FLONASE) 50 MCG/ACT nasal spray Place 2 sprays into both nostrils daily. (Patient taking differently: Place 2 sprays into both nostrils daily as needed for allergies.) 48 g 1   levocetirizine (XYZAL) 5 MG tablet Take 1 tablet (5 mg total) by mouth daily as needed. (Patient taking differently: Take 5 mg by mouth at bedtime.) 90 tablet 1   No facility-administered medications prior to visit.    ROS Review of Systems  Constitutional: Negative.  Negative for chills, diaphoresis, fatigue and unexpected weight change.  HENT: Negative.    Eyes: Negative.   Respiratory:  Negative  for cough, shortness of breath and wheezing.   Cardiovascular:  Negative for chest pain, palpitations and leg swelling.  Gastrointestinal:  Negative for abdominal pain, diarrhea, nausea and vomiting.  Endocrine: Negative.   Genitourinary: Negative.  Negative for difficulty urinating.  Musculoskeletal: Negative.  Negative for arthralgias.  Skin:  Positive for color change and rash.  Neurological:  Negative for dizziness and weakness.  Hematological:  Negative for adenopathy. Does not bruise/bleed easily.  Psychiatric/Behavioral: Negative.     Objective:  BP 136/84 (BP Location: Right Arm, Patient Position: Sitting, Cuff Size: Large)   Pulse 85   Temp 98.1 F (36.7 C) (Oral)   Resp 16   Ht 5' 9.5" (1.765 m)   Wt 176 lb (79.8 kg)   SpO2 96%    BMI 25.62 kg/m   BP Readings from Last 3 Encounters:  07/07/21 136/84  01/19/21 (!) 143/79  01/05/21 132/74    Wt Readings from Last 3 Encounters:  07/07/21 176 lb (79.8 kg)  01/19/21 170 lb 6.4 oz (77.3 kg)  01/05/21 171 lb (77.6 kg)    Physical Exam Vitals reviewed.  HENT:     Nose: Nose normal.     Mouth/Throat:     Mouth: Mucous membranes are moist.  Eyes:     General: No scleral icterus.    Conjunctiva/sclera: Conjunctivae normal.  Cardiovascular:     Rate and Rhythm: Normal rate and regular rhythm.     Heart sounds: No murmur heard. Pulmonary:     Effort: Pulmonary effort is normal.     Breath sounds: No stridor. No wheezing, rhonchi or rales.  Abdominal:     General: Abdomen is flat.     Palpations: There is no mass.     Tenderness: There is no abdominal tenderness. There is no guarding.     Hernia: No hernia is present.  Musculoskeletal:        General: Normal range of motion.     Cervical back: Neck supple.     Right lower leg: No edema.     Left lower leg: No edema.  Lymphadenopathy:     Cervical: No cervical adenopathy.  Skin:    Findings: Erythema and rash present. No abscess or petechiae. Rash is macular. Rash is not papular, pustular, scaling or vesicular.     Comments: The site of the incision and drainage has healed nicely with no induration, fluctuance, warmth, or tenderness.  Scattered around this area is a faint erythematous rash.    Lab Results  Component Value Date   WBC 4.9 01/05/2021   HGB 16.0 01/19/2021   HCT 47.0 01/19/2021   PLT 192.0 01/05/2021   GLUCOSE 111 (H) 01/19/2021   CHOL 160 01/05/2021   TRIG 81.0 01/05/2021   HDL 61.10 01/05/2021   LDLCALC 83 01/05/2021   ALT 23 05/02/2020   AST 21 05/02/2020   NA 138 01/19/2021   K 3.9 01/19/2021   CL 102 01/19/2021   CREATININE 0.80 01/19/2021   BUN 11 01/19/2021   CO2 31 01/05/2021   TSH 3.16 01/05/2021   PSA 0.33 01/05/2021   HGBA1C 5.7 12/31/2019    No results  found.  Assessment & Plan:   Kenneth Sellers was seen today for rash and hypertension.  Diagnoses and all orders for this visit:  Contact dermatitis due to adhesive bandage- I have asked him to stop using the adhesive bandage and the topical antibiotic ointment.  We will treat with a topical steroid. -     fluocinonide  cream (LIDEX) 0.05 %; Apply topically 2 times daily.  Primary hypertension- His blood pressure is adequately well controlled.   I have discontinued Kenneth Sellers's clotrimazole-betamethasone. I am also having him start on fluocinonide cream. Additionally, I am having him maintain his AMBULATORY NON FORMULARY MEDICATION, Multiple Vitamins-Minerals (MULTIVITAMIN ADULTS PO), Restora, EPINEPHrine, tadalafil, UNABLE TO FIND, traMADol, levocetirizine, fluticasone, albuterol, ezetimibe-simvastatin, gabapentin, aliskiren, montelukast, fluticasone furoate-vilanterol, and doxycycline.  Meds ordered this encounter  Medications   fluocinonide cream (LIDEX) 0.05 %    Sig: Apply topically 2 times daily.    Dispense:  30 g    Refill:  0     Follow-up: Return if symptoms worsen or fail to improve.  Scarlette Calico, MD

## 2021-07-07 NOTE — Patient Instructions (Signed)
Contact Dermatitis Dermatitis is redness, soreness, and swelling (inflammation) of the skin. Contact dermatitis is a reaction to certain substances that touch the skin. Many different substances can cause contact dermatitis. There are two types of contact dermatitis: Irritant contact dermatitis. This type is caused by something that irritates your skin, such as having dry hands from washing them too often with soap. This type does not require previous exposure to the substance for a reaction to occur. This is the most common type. Allergic contact dermatitis. This type is caused by a substance that you are allergic to, such as poison ivy. This type occurs when you have been exposed to the substance (allergen) and develop a sensitivity to it. Dermatitis may develop soon after your first exposure to the allergen, or it may not develop until the next time you are exposed and every time thereafter. What are the causes? Irritant contact dermatitis is most commonly caused by exposure to: Makeup. Soaps. Detergents. Bleaches. Acids. Metal salts, such as nickel. Allergic contact dermatitis is most commonly caused by exposure to: Poisonous plants. Chemicals. Jewelry. Latex. Medicines. Preservatives in products, such as clothing. What increases the risk? You are more likely to develop this condition if you have: A job that exposes you to irritants or allergens. Certain medical conditions, such as asthma or eczema. What are the signs or symptoms? Symptoms of this condition may occur on your body anywhere the irritant has touched you or is touched by you. Symptoms include: Dryness or flaking. Redness. Cracks. Itching. Pain or a burning feeling. Blisters. Drainage of small amounts of blood or clear fluid from skin cracks. With allergic contact dermatitis, there may also be swelling in areas such as the eyelids, mouth, or genitals. How is this diagnosed? This condition is diagnosed with a medical  history and physical exam. A patch skin test may be performed to help determine the cause. If the condition is related to your job, you may need to see an occupational medicine specialist. How is this treated? This condition is treated by checking for the cause of the reaction and protecting your skin from further contact. Treatment may also include: Steroid creams or ointments. Oral steroid medicines may be needed in more severe cases. Antibiotic medicines or antibacterial ointments, if a skin infection is present. Antihistamine lotion or an antihistamine taken by mouth to ease itching. A bandage (dressing). Follow these instructions at home: Skin care Moisturize your skin as needed. Apply cool compresses to the affected areas. Try applying baking soda paste to your skin. Stir water into baking soda until it reaches a paste-like consistency. Do not scratch your skin, and avoid friction to the affected area. Avoid the use of soaps, perfumes, and dyes. Medicines Take or apply over-the-counter and prescription medicines only as told by your health care provider. If you were prescribed an antibiotic medicine, take or apply the antibiotic as told by your health care provider. Do not stop using the antibiotic even if your condition improves. Bathing Try taking a bath with: Epsom salts. Follow the instructions on the packaging. You can get these at your local pharmacy or grocery store. Baking soda. Pour a small amount into the bath as directed by your health care provider. Colloidal oatmeal. Follow the instructions on the packaging. You can get this at your local pharmacy or grocery store. Bathe less frequently, such as every other day. Bathe in lukewarm water. Avoid using hot water. Bandage care If you were given a bandage (dressing), change it as told   by your health care provider. Wash your hands with soap and water before and after you change your dressing. If soap and water are not  available, use hand sanitizer. General instructions Avoid the substance that caused your reaction. If you do not know what caused it, keep a journal to try to track what caused it. Write down: What you eat. What cosmetic products you use. What you drink. What you wear in the affected area. This includes jewelry. Check the affected areas every day for signs of infection. Check for: More redness, swelling, or pain. More fluid or blood. Warmth. Pus or a bad smell. Keep all follow-up visits as told by your health care provider. This is important. Contact a health care provider if: Your condition does not improve with treatment. Your condition gets worse. You have signs of infection such as swelling, tenderness, redness, soreness, or warmth in the affected area. You have a fever. You have new symptoms. Get help right away if: You have a severe headache, neck pain, or neck stiffness. You vomit. You feel very sleepy. You notice red streaks coming from the affected area. Your bone or joint underneath the affected area becomes painful after the skin has healed. The affected area turns darker. You have difficulty breathing. Summary Dermatitis is redness, soreness, and swelling (inflammation) of the skin. Contact dermatitis is a reaction to certain substances that touch the skin. Symptoms of this condition may occur on your body anywhere the irritant has touched you or is touched by you. This condition is treated by figuring out what caused the reaction and protecting your skin from further contact. Treatment may also include medicines and skin care. Avoid the substance that caused your reaction. If you do not know what caused it, keep a journal to try to track what caused it. Contact a health care provider if your condition gets worse or you have signs of infection such as swelling, tenderness, redness, soreness, or warmth in the affected area. This information is not intended to replace  advice given to you by your health care provider. Make sure you discuss any questions you have with your health care provider. Document Revised: 11/10/2020 Document Reviewed: 11/10/2020 [employer] Patient Education  2023 [employer]  

## 2021-07-26 ENCOUNTER — Other Ambulatory Visit (HOSPITAL_COMMUNITY): Payer: Self-pay

## 2021-07-27 ENCOUNTER — Other Ambulatory Visit (HOSPITAL_COMMUNITY): Payer: Self-pay

## 2021-07-27 MED ORDER — LEVOCETIRIZINE DIHYDROCHLORIDE 5 MG PO TABS
5.0000 mg | ORAL_TABLET | Freq: Every day | ORAL | 1 refills | Status: DC
  Filled 2021-07-27: qty 90, 90d supply, fill #0
  Filled 2021-10-13: qty 90, 90d supply, fill #1

## 2021-07-28 ENCOUNTER — Other Ambulatory Visit (HOSPITAL_COMMUNITY): Payer: Self-pay

## 2021-07-29 ENCOUNTER — Other Ambulatory Visit (HOSPITAL_COMMUNITY): Payer: Self-pay

## 2021-09-06 ENCOUNTER — Other Ambulatory Visit (HOSPITAL_COMMUNITY): Payer: Self-pay

## 2021-09-06 ENCOUNTER — Other Ambulatory Visit: Payer: Self-pay | Admitting: Internal Medicine

## 2021-09-06 DIAGNOSIS — E785 Hyperlipidemia, unspecified: Secondary | ICD-10-CM

## 2021-09-07 ENCOUNTER — Other Ambulatory Visit (HOSPITAL_COMMUNITY): Payer: Self-pay

## 2021-09-07 MED ORDER — EZETIMIBE-SIMVASTATIN 10-20 MG PO TABS
1.0000 | ORAL_TABLET | Freq: Every day | ORAL | 1 refills | Status: DC
  Filled 2021-09-07: qty 90, 90d supply, fill #0
  Filled 2021-11-26: qty 90, 90d supply, fill #1

## 2021-09-07 MED ORDER — FLUTICASONE FUROATE-VILANTEROL 200-25 MCG/ACT IN AEPB
INHALATION_SPRAY | RESPIRATORY_TRACT | 3 refills | Status: DC
  Filled 2021-09-07: qty 60, 30d supply, fill #0
  Filled 2021-10-13: qty 60, 30d supply, fill #1
  Filled 2021-11-26: qty 60, 30d supply, fill #2
  Filled 2021-12-25: qty 60, 30d supply, fill #3

## 2021-09-09 DIAGNOSIS — J3081 Allergic rhinitis due to animal (cat) (dog) hair and dander: Secondary | ICD-10-CM | POA: Diagnosis not present

## 2021-09-09 DIAGNOSIS — J3089 Other allergic rhinitis: Secondary | ICD-10-CM | POA: Diagnosis not present

## 2021-09-09 DIAGNOSIS — J301 Allergic rhinitis due to pollen: Secondary | ICD-10-CM | POA: Diagnosis not present

## 2021-09-09 DIAGNOSIS — J4540 Moderate persistent asthma, uncomplicated: Secondary | ICD-10-CM | POA: Diagnosis not present

## 2021-10-13 ENCOUNTER — Other Ambulatory Visit (HOSPITAL_COMMUNITY): Payer: Self-pay

## 2021-10-13 ENCOUNTER — Other Ambulatory Visit: Payer: Self-pay | Admitting: Internal Medicine

## 2021-10-13 DIAGNOSIS — I10 Essential (primary) hypertension: Secondary | ICD-10-CM

## 2021-10-13 MED ORDER — MONTELUKAST SODIUM 10 MG PO TABS
10.0000 mg | ORAL_TABLET | Freq: Every day | ORAL | 2 refills | Status: DC
  Filled 2021-10-13: qty 90, 90d supply, fill #0
  Filled 2022-01-15: qty 90, 90d supply, fill #1
  Filled 2022-04-26: qty 90, 90d supply, fill #2

## 2021-10-13 MED ORDER — ALISKIREN FUMARATE 150 MG PO TABS
ORAL_TABLET | Freq: Every day | ORAL | 0 refills | Status: DC
  Filled 2021-10-13: qty 90, 90d supply, fill #0

## 2021-10-14 ENCOUNTER — Other Ambulatory Visit (HOSPITAL_COMMUNITY): Payer: Self-pay

## 2021-10-14 ENCOUNTER — Ambulatory Visit: Payer: Medicare PPO | Admitting: Emergency Medicine

## 2021-10-14 ENCOUNTER — Encounter: Payer: Self-pay | Admitting: Emergency Medicine

## 2021-10-14 VITALS — BP 132/74 | HR 96 | Temp 98.1°F | Ht 69.5 in | Wt 181.4 lb

## 2021-10-14 DIAGNOSIS — J22 Unspecified acute lower respiratory infection: Secondary | ICD-10-CM | POA: Insufficient documentation

## 2021-10-14 DIAGNOSIS — R051 Acute cough: Secondary | ICD-10-CM | POA: Diagnosis not present

## 2021-10-14 MED ORDER — AZITHROMYCIN 250 MG PO TABS
ORAL_TABLET | ORAL | 0 refills | Status: DC
  Filled 2021-10-14: qty 6, 5d supply, fill #0

## 2021-10-14 MED ORDER — PREDNISONE 20 MG PO TABS
40.0000 mg | ORAL_TABLET | Freq: Every day | ORAL | 0 refills | Status: AC
  Filled 2021-10-14: qty 10, 5d supply, fill #0

## 2021-10-14 MED ORDER — GUAIFENESIN-CODEINE 100-10 MG/5ML PO SYRP
5.0000 mL | ORAL_SOLUTION | Freq: Three times a day (TID) | ORAL | 1 refills | Status: DC | PRN
  Filled 2021-10-14: qty 120, 8d supply, fill #0

## 2021-10-14 NOTE — Progress Notes (Signed)
Kenneth Sellers 66 y.o.   Chief Complaint  Patient presents with   Cough    Poss bronchitis, Patient states he has had a cold since July     HISTORY OF PRESENT ILLNESS: Acute problem visit today.  Patient of Dr. Scarlette Calico. This is a 66 y.o. male complaining of having a cold since early July now possibly turning into bronchitis.  Complaining of productive cough.  Denies fever or chills.  Denies sore throat.  Has some chest congestion but no other associated symptoms. No other complaints or medical concerns today.  Cough Pertinent negatives include no chest pain, chills, fever, headaches, rash or shortness of breath.     Prior to Admission medications   Medication Sig Start Date End Date Taking? Authorizing Provider  albuterol (VENTOLIN HFA) 108 (90 Base) MCG/ACT inhaler Use 1 - 2 puffs as needed every 4 -  6 hours as needed for cough/wheeze 01/30/21  Yes   aliskiren (TEKTURNA) 150 MG tablet TAKE 1 TABLET BY MOUTH ONCE DAILY 10/13/21 10/13/22 Yes Janith Lima, MD  AMBULATORY NON FORMULARY MEDICATION CPAP machine nightly   Yes [provider]  EPINEPHrine 0.3 mg/0.3 mL IJ SOAJ injection Inject 1 pen as directed as needed for systemic reactions 07/03/20  Yes   ezetimibe-simvastatin (VYTORIN) 10-20 MG tablet TAKE 1 TABLET BY MOUTH EVERY NIGHT AT BEDTIME 09/07/21 09/07/22 Yes Janith Lima, MD  fluocinonide cream (LIDEX) 0.05 % Apply topically 2 times daily. 07/07/21  Yes Janith Lima, MD  fluticasone Asencion Islam) 50 MCG/ACT nasal spray Place 1 - 2 sprays in each nostril once a day 01/28/21  Yes   fluticasone furoate-vilanterol (BREO ELLIPTA) 200-25 MCG/ACT AEPB Inhale 1 puff into the lungs once daily 09/07/21  Yes   gabapentin (NEURONTIN) 100 MG capsule TAKE 1 CAPSULE BY MOUTH 3 TIMES DAILY 03/11/21 03/11/22 Yes Janith Lima, MD  levocetirizine (XYZAL) 5 MG tablet Take 1 tablet (5 mg total) by mouth daily. 07/27/21  Yes   montelukast (SINGULAIR) 10 MG tablet Take 1 tablet (10 mg total)  by mouth daily. 10/13/21  Yes   Multiple Vitamins-Minerals (MULTIVITAMIN ADULTS PO) Take 1 tablet by mouth daily.    Yes [provider]  Probiotic Product (RESTORA) CAPS TAKE 1 CAPSULE BY MOUTH DAILY. 12/15/18  Yes Janith Lima, MD  tadalafil (CIALIS) 20 MG tablet Take 1 tablet by mouth if needed daily as directed. 12/08/20  Yes   traMADol (ULTRAM) 50 MG tablet Take 1 tablet (50 mg total) by mouth every 6 (six) hours as needed. 01/19/21  Yes Dahlstedt, Annie Main, MD  UNABLE TO FIND Allergy shots every 7 to 10 days with dr Su Hilt [provider]    No Known Allergies  Patient Active Problem List   Diagnosis Date Noted   Contact dermatitis due to adhesive bandage 07/07/2021   HTN (hypertension) 05/03/2020   Allergic rhinitis due to animal (cat) (dog) hair and dander 05/02/2020   Allergic rhinitis due to pollen 05/02/2020   Dermatitis 05/02/2020   Moderate persistent asthma, uncomplicated 16/11/9602   Encounter for general adult medical examination with abnormal findings 12/31/2019   Hyperglycemia 12/31/2019   Benign essential tremor 09/27/2019   Neurodermatitis, localized 09/27/2019   White coat syndrome with diagnosis of hypertension 09/23/2015   OBSTRUCTIVE SLEEP APNEA 11/07/2009   ECZEMA, ATOPIC 05/15/2008   Hyperlipidemia with target LDL less than 130 01/17/2007   Allergic rhinitis 01/17/2007   Asthma 01/17/2007    Past Medical History:  Diagnosis Date   Allergic rhinitis    Dr. Donneta Romberg   Allergy    Arthritis    hands   Asthma    Cataract    small immature left eye   Diverticulosis of colon    DJD (degenerative joint disease)    lower back   History of kidney stones 2020   x 1 passed on own   Hyperlipidemia    Hypertension    Neuromuscular disorder ([place])    essential tremors   OSA (obstructive sleep apnea)    PSG 08/26/02 AHI 59--CPAP 7 cm   Phimosis 01/14/2021   Pneumonia 1988   treated as OP   Sleep apnea    wears cpap    Tachycardia  2003   Dr. Mare Ferrari, negative cardiac work up    Past Surgical History:  Procedure Laterality Date   CIRCUMCISION N/A 01/19/2021   Procedure: CIRCUMCISION ADULT;  Surgeon: Franchot Gallo, MD;  Location: [facility];  Service: Urology;  Laterality: N/A;   colon tics  03/2005   [facility]   COLONOSCOPY  03/2005   colonscopy  12/25/2018   EYE SURGERY Right 2020   right ioc lens replacement for cataract   lesion removed L neck; pigmented mole at hairline, non melanoma  16/1096   NISSEN FUNDOPLICATION  0454    Dr Margot Chimes; complicated by herniation post op with retching requiring 2nd surgery   POLYPECTOMY     RHINOPLASTY  1983    Social History   Socioeconomic History   Marital status: Married    Spouse name: Not on file   Number of children: Not on file   Years of education: Not on file   Highest education level: Not on file  Occupational History   Occupation: retired    Comment: Pharmacist, hospital  Tobacco Use   Smoking status: Never   Smokeless tobacco: Never  Vaping Use   Vaping Use: Never used  Substance and Sexual Activity   Alcohol use: Yes    Alcohol/week: 2.0 standard drinks of alcohol    Types: 2 Cans of beer per week    Comment: occasionally   Drug use: No   Sexual activity: Yes  Other Topics Concern   Not on file  Social History Narrative   Not on file   Social Determinants of Health   Financial Resource Strain: Not on file  Food Insecurity: Not on file  Transportation Needs: Not on file  Physical Activity: Not on file  Stress: Not on file  Social Connections: Not on file  Intimate Partner Violence: Not on file    Family History  Problem Relation Age of Onset   Heart attack Father 32   Diabetes Mother    Hypertension Mother    Stroke Mother 58   Heart failure Mother    Diabetes Sister    Allergies Sister    Hypertension Brother    Heart disease Brother    Diabetes Brother        2 brothers   Heart attack Brother 51       smoker    COPD Brother        died in his 32s   Arthritis Sister    Cancer Neg Hx    Early death Neg Hx    Hyperlipidemia Neg Hx    Kidney disease Neg Hx    Colon cancer Neg Hx    Colon polyps Neg Hx    Esophageal cancer Neg Hx    Rectal  cancer Neg Hx    Stomach cancer Neg Hx      Review of Systems  Constitutional: Negative.  Negative for chills and fever.  HENT:  Positive for congestion.   Respiratory:  Positive for cough and sputum production. Negative for shortness of breath.   Cardiovascular: Negative.  Negative for chest pain and palpitations.  Gastrointestinal: Negative.  Negative for abdominal pain, diarrhea, nausea and vomiting.  Genitourinary: Negative.   Skin: Negative.  Negative for rash.  Neurological:  Negative for dizziness and headaches.  All other systems reviewed and are negative.  Today's Vitals   10/14/21 1541  BP: 132/74  Pulse: 96  Temp: 98.1 F (36.7 C)  TempSrc: Oral  SpO2: 97%  Weight: 181 lb 6 oz (82.3 kg)  Height: 5' 9.5" (1.765 m)   Body mass index is 26.4 kg/m.   Physical Exam Vitals reviewed.  Constitutional:      Appearance: Normal appearance.  HENT:     Head: Normocephalic.     Mouth/Throat:     Mouth: Mucous membranes are moist.     Pharynx: Oropharynx is clear.  Eyes:     Extraocular Movements: Extraocular movements intact.     Conjunctiva/sclera: Conjunctivae normal.     Pupils: Pupils are equal, round, and reactive to light.  Cardiovascular:     Rate and Rhythm: Normal rate and regular rhythm.     Pulses: Normal pulses.     Heart sounds: Normal heart sounds.  Pulmonary:     Effort: Pulmonary effort is normal.     Breath sounds: Normal breath sounds.  Musculoskeletal:        General: Normal range of motion.     Cervical back: No tenderness.  Lymphadenopathy:     Cervical: No cervical adenopathy.  Skin:    General: Skin is warm and dry.     Capillary Refill: Capillary refill takes less than 2 seconds.  Neurological:      General: No focal deficit present.     Mental Status: He is alert and oriented to person, place, and time.  Psychiatric:        Mood and Affect: Mood normal.        Behavior: Behavior normal.      ASSESSMENT & PLAN: A total of 35 minutes was spent with the patient and counseling/coordination of care regarding preparing for this visit, review of available medical records, review of chronic medical conditions and their management, review of all medications, diagnosis of lower respiratory infection and management including use of antibiotics and cough management, prognosis, documentation, need for follow-up if no better or worse during the next several days.  Problem List Items Addressed This Visit       Respiratory   Lower respiratory infection - Primary    Viral respiratory infection now developing secondary bacterial infection. Will benefit from daily azithromycin for 5 days. Clinically stable.  No red flag signs or symptoms. No findings of pneumonia.  No wheezing on physical examination. Advised to contact the office if no better or worse during the next several days.      Relevant Medications   predniSONE (DELTASONE) 20 MG tablet   azithromycin (ZITHROMAX) 250 MG tablet     Other   Acute cough    Continue over-the-counter Mucinex DM. Guaifenesin with codeine elixir as needed for cough Advised to stay well-hydrated May take emergen-C or airborne daily for the next several days      Relevant Medications   predniSONE (DELTASONE) 20 MG  tablet   guaiFENesin-codeine (ROBITUSSIN AC) 100-10 MG/5ML syrup   Patient Instructions  Acute Bronchitis, Adult  Acute bronchitis is when air tubes in the lungs (bronchi) suddenly get swollen. The condition can make it hard for you to breathe. In adults, acute bronchitis usually goes away within 2 weeks. A cough caused by bronchitis may last up to 3 weeks. Smoking, allergies, and asthma can make the condition worse. What are the  causes? Germs that cause cold and flu (viruses). The most common cause of this condition is the virus that causes the common cold. Bacteria. Substances that bother (irritate) the lungs, including: Smoke from cigarettes and other types of tobacco. Dust and pollen. Fumes from chemicals, gases, or burned fuel. Indoor or outdoor air pollution. What increases the risk? A weak body's defense system. This is also called the immune system. Any condition that affects your lungs and breathing, such as asthma. What are the signs or symptoms? A cough. Coughing up clear, yellow, or green mucus. Making high-pitched whistling sounds when you breathe, most often when you breathe out (wheezing). Runny or stuffy nose. Having too much mucus in your lungs (chest congestion). Shortness of breath. Body aches. A sore throat. How is this treated? Acute bronchitis may go away over time without treatment. Your doctor may tell you to: Drink more fluids. This will help thin your mucus so it is easier to cough up. Use a device that gets medicine into your lungs (inhaler). Use a vaporizer or a humidifier. These are machines that add water to the air. This helps with coughing and poor breathing. Take a medicine that thins mucus and helps clear it from your lungs. Take a medicine that prevents or stops coughing. It is not common to take an antibiotic medicine for this condition. Follow these instructions at home:  Take over-the-counter and prescription medicines only as told by your doctor. Use an inhaler, vaporizer, or humidifier as told by your doctor. Take two teaspoons (10 mL) of honey at bedtime. This helps lessen your coughing at night. Drink enough fluid to keep your pee (urine) pale yellow. Do not smoke or use any products that contain nicotine or tobacco. If you need help quitting, ask your doctor. Get a lot of rest. Return to your normal activities when your doctor says that it is safe. Keep all  follow-up visits. How is this prevented?  Wash your hands often with soap and water for at least 20 seconds. If you cannot use soap and water, use hand sanitizer. Avoid contact with people who have cold symptoms. Try not to touch your mouth, nose, or eyes with your hands. Avoid breathing in smoke or chemical fumes. Make sure to get the flu shot every year. Contact a doctor if: Your symptoms do not get better in 2 weeks. You have trouble coughing up the mucus. Your cough keeps you awake at night. You have a fever. Get help right away if: You cough up blood. You have chest pain. You have very bad shortness of breath. You faint or keep feeling like you are going to faint. You have a very bad headache. Your fever or chills get worse. These symptoms may be an emergency. Get help right away. Call your local emergency services (911 in the [place] Do not wait to see if the symptoms will go away. Do not drive yourself to the hospital. Summary Acute bronchitis is when air tubes in the lungs (bronchi) suddenly get swollen. In adults, acute bronchitis usually goes away within  2 weeks. Drink more fluids. This will help thin your mucus so it is easier to cough up. Take over-the-counter and prescription medicines only as told by your doctor. Contact a doctor if your symptoms do not improve after 2 weeks of treatment. This information is not intended to replace advice given to you by your health care provider. Make sure you discuss any questions you have with your health care provider. Document Revised: 05/28/2020 Document Reviewed: 05/28/2020 Elsevier Patient Education  [place], MD [facility]

## 2021-10-14 NOTE — Assessment & Plan Note (Signed)
Viral respiratory infection now developing secondary bacterial infection. Will benefit from daily azithromycin for 5 days. Clinically stable.  No red flag signs or symptoms. No findings of pneumonia.  No wheezing on physical examination. Advised to contact the office if no better or worse during the next several days.

## 2021-10-14 NOTE — Patient Instructions (Signed)
  Acute Bronchitis, Adult  Acute bronchitis is when air tubes in the lungs (bronchi) suddenly get swollen. The condition can make it hard for you to breathe. In adults, acute bronchitis usually goes away within 2 weeks. A cough caused by bronchitis may last up to 3 weeks. Smoking, allergies, and asthma can make the condition worse. What are the causes? Germs that cause cold and flu (viruses). The most common cause of this condition is the virus that causes the common cold. Bacteria. Substances that bother (irritate) the lungs, including: Smoke from cigarettes and other types of tobacco. Dust and pollen. Fumes from chemicals, gases, or burned fuel. Indoor or outdoor air pollution. What increases the risk? A weak body's defense system. This is also called the immune system. Any condition that affects your lungs and breathing, such as asthma. What are the signs or symptoms? A cough. Coughing up clear, yellow, or green mucus. Making high-pitched whistling sounds when you breathe, most often when you breathe out (wheezing). Runny or stuffy nose. Having too much mucus in your lungs (chest congestion). Shortness of breath. Body aches. A sore throat. How is this treated? Acute bronchitis may go away over time without treatment. Your doctor may tell you to: Drink more fluids. This will help thin your mucus so it is easier to cough up. Use a device that gets medicine into your lungs (inhaler). Use a vaporizer or a humidifier. These are machines that add water to the air. This helps with coughing and poor breathing. Take a medicine that thins mucus and helps clear it from your lungs. Take a medicine that prevents or stops coughing. It is not common to take an antibiotic medicine for this condition. Follow these instructions at home:  Take over-the-counter and prescription medicines only as told by your doctor. Use an inhaler, vaporizer, or humidifier as told by your doctor. Take two  teaspoons (10 mL) of honey at bedtime. This helps lessen your coughing at night. Drink enough fluid to keep your pee (urine) pale yellow. Do not smoke or use any products that contain nicotine or tobacco. If you need help quitting, ask your doctor. Get a lot of rest. Return to your normal activities when your doctor says that it is safe. Keep all follow-up visits. How is this prevented?  Wash your hands often with soap and water for at least 20 seconds. If you cannot use soap and water, use hand sanitizer. Avoid contact with people who have cold symptoms. Try not to touch your mouth, nose, or eyes with your hands. Avoid breathing in smoke or chemical fumes. Make sure to get the flu shot every year. Contact a doctor if: Your symptoms do not get better in 2 weeks. You have trouble coughing up the mucus. Your cough keeps you awake at night. You have a fever. Get help right away if: You cough up blood. You have chest pain. You have very bad shortness of breath. You faint or keep feeling like you are going to faint. You have a very bad headache. Your fever or chills get worse. These symptoms may be an emergency. Get help right away. Call your local emergency services (911 in the [place] Do not wait to see if the symptoms will go away. Do not drive yourself to the hospital. Summary Acute bronchitis is when air tubes in the lungs (bronchi) suddenly get swollen. In adults, acute bronchitis usually goes away within 2 weeks. Drink more fluids. This will help thin your mucus so it   is easier to cough up. Take over-the-counter and prescription medicines only as told by your doctor. Contact a doctor if your symptoms do not improve after 2 weeks of treatment. This information is not intended to replace advice given to you by your health care provider. Make sure you discuss any questions you have with your health care provider. Document Revised: 05/28/2020 Document Reviewed: 05/28/2020 Elsevier  Patient Education  2023 [employer] Inc[employer]  

## 2021-10-14 NOTE — Assessment & Plan Note (Signed)
Continue over-the-counter Mucinex DM. Guaifenesin with codeine elixir as needed for cough Advised to stay well-hydrated May take emergen-C or airborne daily for the next several days

## 2021-11-11 ENCOUNTER — Ambulatory Visit: Payer: Medicare PPO | Admitting: Nurse Practitioner

## 2021-11-16 ENCOUNTER — Ambulatory Visit: Payer: Medicare PPO | Admitting: Nurse Practitioner

## 2021-11-16 ENCOUNTER — Other Ambulatory Visit (HOSPITAL_COMMUNITY): Payer: Self-pay

## 2021-11-16 ENCOUNTER — Other Ambulatory Visit (HOSPITAL_BASED_OUTPATIENT_CLINIC_OR_DEPARTMENT_OTHER): Payer: Self-pay

## 2021-11-16 ENCOUNTER — Encounter: Payer: Self-pay | Admitting: Nurse Practitioner

## 2021-11-16 VITALS — BP 130/80 | HR 95 | Ht 69.5 in | Wt 183.2 lb

## 2021-11-16 DIAGNOSIS — G4733 Obstructive sleep apnea (adult) (pediatric): Secondary | ICD-10-CM

## 2021-11-16 DIAGNOSIS — U071 COVID-19: Secondary | ICD-10-CM

## 2021-11-16 DIAGNOSIS — J3089 Other allergic rhinitis: Secondary | ICD-10-CM | POA: Diagnosis not present

## 2021-11-16 MED ORDER — AZELASTINE HCL 0.1 % NA SOLN
2.0000 | Freq: Two times a day (BID) | NASAL | 5 refills | Status: AC
  Filled 2021-11-16: qty 30, 50d supply, fill #0
  Filled 2021-11-16: qty 30, 30d supply, fill #0

## 2021-11-16 NOTE — Patient Instructions (Addendum)
Continue to use CPAP every night, minimum of 4-6 hours a night.  Change equipment every 30 days or as directed by DME. Wash your tubing with warm soap and water daily, hang to dry. Wash humidifier portion weekly.  Be aware of reduced alertness and do not drive or operate heavy machinery if experiencing this or drowsiness.  Exercise encouraged, as tolerated. Avoid or decrease alcohol consumption and medications that make you more sleepy, if possible. Notify if persistent daytime sleepiness occurs even with consistent use of CPAP.  Continue flonase nasal spray 2 sprays each nostril daily Continue Xyzal 1 tab daily Continue singulair 1 tab At bedtime Continue Breo 1 puff daily. Brush tongue and rinse mouth afterwards Continue Albuterol inhaler 2 puffs every 6 hours as needed for shortness of breath or wheezing. Notify if symptoms persist despite rescue inhaler/neb use.    Try adding astelin 2 sprays each nostril Twice daily for nasal congestion  Follow up in one year with Dr. Halford Chessman or sooner if needed

## 2021-11-16 NOTE — Assessment & Plan Note (Signed)
History of severe OSA on CPAP. He has excellent compliance and continues to receive good benefit from use. Good control of events with residual AHI 1.4. No adjustments made today.   Patient Instructions  Continue to use CPAP every night, minimum of 4-6 hours a night.  Change equipment every 30 days or as directed by DME. Wash your tubing with warm soap and water daily, hang to dry. Wash humidifier portion weekly.  Be aware of reduced alertness and do not drive or operate heavy machinery if experiencing this or drowsiness.  Exercise encouraged, as tolerated. Avoid or decrease alcohol consumption and medications that make you more sleepy, if possible. Notify if persistent daytime sleepiness occurs even with consistent use of CPAP.  Continue flonase nasal spray 2 sprays each nostril daily Continue Xyzal 1 tab daily Continue singulair 1 tab At bedtime Continue Breo 1 puff daily. Brush tongue and rinse mouth afterwards Continue Albuterol inhaler 2 puffs every 6 hours as needed for shortness of breath or wheezing. Notify if symptoms persist despite rescue inhaler/neb use.    Try adding astelin 2 sprays each nostril Twice daily for nasal congestion  Follow up in one year with Dr. Halford Chessman or sooner if needed

## 2021-11-16 NOTE — Progress Notes (Signed)
$'@Patient'g$  ID: Kenneth Sellers, male    DOB: [birth year], 66 y.o.   MRN: [id number]  Chief Complaint  Patient presents with   Follow-up    Referring provider: Janith Lima, MD  HPI: 66 year old male, never smoker followed for OSA on CPAP. He is a patient of Dr. Juanetta Gosling and last seen in office 07/11/2019.  Past medical history significant for hypertension, allergic rhinitis, asthma, benign essential tremor, HLD.  He is followed by Dr. Donneta Romberg with allergy/immunology for his allergies and asthma.  TEST/EVENTS:  08/26/2002 PSG: AHI 59  07/11/2019: OV with Dr. Halford Chessman.  Had bronchitis last year.  Had lingering cough until earlier this year.  Followed by Dr. Donneta Romberg.  Gets allergy shots.  Using CPAP nightly.  No concerns or complaints.  11/16/2021: Today-follow up Patient presents today for overdue follow-up.  He has been doing relatively well since we saw him last.  He did have a bout of bronchitis in July which she was slowly recovering from after course of antibiotics and steroids.  He then went to [place] in the middle of September and after coming home, tested positive for COVID on 9/29.  He has recovered well since. Feels like his breathing is gotten back to his baseline.  He does still have some nasal congestion, it feels worse on his right side.  He is using Flonase nasal spray which does help some.  Denies any increase shortness of breath, cough or wheezing.  He is followed by Dr. Donneta Romberg with allergy/immunology. From a sleep apnea standpoint, he is doing well with his CPAP.  He has excellent compliance.  Wears it most every night without fail.  Feels like he cannot really sleep without it.  Denies any excessive daytime fatigue, breakthrough snoring, drowsy driving, morning headaches.  No concerns or complaints today regarding his sleep apnea.  10/13/2021-11/11/2021 CPAP auto 5-15 cmH2O 28/30 days; 87% >4hr; average usage 5 hours 58 minutes Pressure median 6.2, 95th 7.8 Leaks median 6.9, 95th 29.4 AHI  1.4  No Known Allergies  Immunization History  Administered Date(s) Administered   Fluad Quad(high Dose 65+) 11/18/2018, 11/22/2019   Influenza Whole 01/10/2007, 10/28/2009   Influenza, High Dose Seasonal PF 01/21/2017, 01/25/2018, 01/24/2019   Influenza,inj,Quad PF,6+ Mos 12/14/2012, 11/21/2014, 11/25/2015, 11/27/2016, 11/16/2017   Influenza-Unspecified 11/11/2020   PFIZER Comirnaty(Gray Top)Covid-19 Tri-Sucrose Vaccine 06/20/2020   PFIZER(Purple Top)SARS-COV-2 Vaccination 02/26/2019, 04/02/2019, 07/30/2019, 11/13/2019   Pfizer Covid-19 Vaccine Bivalent Booster 77yr & up 11/04/2020   Pneumococcal Conjugate-13 12/28/2018   Pneumococcal Polysaccharide-23 04/27/2017   Tdap 09/16/2014   Zoster Recombinat (Shingrix) 01/05/2021, 04/09/2021    Past Medical History:  Diagnosis Date   Allergic rhinitis    Dr. SDonneta Romberg  Allergy    Arthritis    hands   Asthma    Cataract    small immature left eye   Diverticulosis of colon    DJD (degenerative joint disease)    lower back   History of kidney stones 2020   x 1 passed on own   Hyperlipidemia    Hypertension    Neuromuscular disorder ([facility]inel    essential tremors   OSA (obstructive sleep apnea)    PSG 08/26/02 AHI 59--CPAP 7 cm   Phimosis 01/14/2021   Pneumonia 1988   treated as OP   Sleep apnea    wears cpap    Tachycardia 2003   Dr. BMare Ferrari negative cardiac work up    Tobacco History: Social History   Tobacco Use  Smoking Status Never  Smokeless Tobacco Never   Counseling given: Not Answered   Outpatient Medications Prior to Visit  Medication Sig Dispense Refill   albuterol (VENTOLIN HFA) 108 (90 Base) MCG/ACT inhaler Use 1 - 2 puffs as needed every 4 -  6 hours as needed for cough/wheeze 18 g 0   fluticasone (FLONASE) 50 MCG/ACT nasal spray Place 1 - 2 sprays in each nostril once a day 48 g 1   fluticasone furoate-vilanterol (BREO ELLIPTA) 200-25 MCG/ACT AEPB Inhale 1 puff into the lungs once daily 60 each 3    guaiFENesin-codeine (ROBITUSSIN AC) 100-10 MG/5ML syrup Take 5 mLs by mouth 3 times daily as needed for cough. 120 mL 1   levocetirizine (XYZAL) 5 MG tablet Take 1 tablet (5 mg total) by mouth daily. 90 tablet 1   montelukast (SINGULAIR) 10 MG tablet Take 1 tablet (10 mg total) by mouth daily. 90 tablet 2   aliskiren (TEKTURNA) 150 MG tablet TAKE 1 TABLET BY MOUTH ONCE DAILY 90 tablet 0   AMBULATORY NON FORMULARY MEDICATION CPAP machine nightly     azithromycin (ZITHROMAX) 250 MG tablet Take 2 tablets by mouth on day 1 then take 1 tablet daily on days 2-5. (Patient not taking: Reported on 11/16/2021) 6 tablet 0   EPINEPHrine 0.3 mg/0.3 mL IJ SOAJ injection Inject 1 pen as directed as needed for systemic reactions 1 each 1   ezetimibe-simvastatin (VYTORIN) 10-20 MG tablet TAKE 1 TABLET BY MOUTH EVERY NIGHT AT BEDTIME 90 tablet 1   fluocinonide cream (LIDEX) 0.05 % Apply topically 2 times daily. 30 g 0   gabapentin (NEURONTIN) 100 MG capsule TAKE 1 CAPSULE BY MOUTH 3 TIMES DAILY 270 capsule 1   Multiple Vitamins-Minerals (MULTIVITAMIN ADULTS PO) Take 1 tablet by mouth daily.      Probiotic Product (RESTORA) CAPS TAKE 1 CAPSULE BY MOUTH DAILY. 90 capsule 1   tadalafil (CIALIS) 20 MG tablet Take 1 tablet by mouth if needed daily as directed. 20 tablet 11   traMADol (ULTRAM) 50 MG tablet Take 1 tablet (50 mg total) by mouth every 6 (six) hours as needed. (Patient not taking: Reported on 11/16/2021) 10 tablet 0   UNABLE TO FIND Allergy shots every 7 to 10 days with dr Donneta Romberg (Patient not taking: Reported on 11/16/2021)     No facility-administered medications prior to visit.     Review of Systems:   Constitutional: No weight loss or gain, night sweats, fevers, chills, fatigue, or lassitude. HEENT: No headaches, difficulty swallowing, tooth/dental problems, or sore throat. No itching, ear ache. +occasional sneezing, nasal congestion CV:  No chest pain, orthopnea, PND, swelling in lower extremities,  anasarca, dizziness, palpitations, syncope Resp: +cough (improved). No increased shortness of breath with exertion or at rest. No excess mucus or change in color of mucus. No productive or non-productive. No hemoptysis. No wheezing.  No chest wall deformity MSK:  No joint pain or swelling.  No decreased range of motion.  No back pain. Neuro: No dizziness or lightheadedness.  Psych: No depression or anxiety. Mood stable.     Physical Exam:  BP 130/80 (BP Location: Right Arm)   Pulse 95   Ht 5' 9.5" (1.765 m)   Wt 183 lb 3.2 oz (83.1 kg)   SpO2 98%   BMI 26.67 kg/m   GEN: Pleasant, interactive, well-appearing; in no acute distress. HEENT:  Normocephalic and atraumatic. PERRLA. Sclera white. Nasal turbinates pink, moist and patent bilaterally. No rhinorrhea present. Oropharynx pink and moist, without exudate or edema.  No lesions, ulcerations, or postnasal drip. Mallampati II NECK:  Supple w/ fair ROM. No lymphadenopathy.   CV: RRR, no m/r/g, no peripheral edema. Pulses intact, +2 bilaterally. No cyanosis, pallor or clubbing. PULMONARY:  Unlabored, regular breathing. Clear bilaterally A&P w/o wheezes/rales/rhonchi. No accessory muscle use.  GI: BS present and normoactive. Soft, non-tender to palpation. No organomegaly or masses detected.  MSK: No erythema, warmth or tenderness. No deformities or joint swelling noted.  Neuro: A/Ox3. No focal deficits noted.   Skin: Warm, no lesions or rashe Psych: Normal affect and behavior. Judgement and thought content appropriate.     Lab Results:  CBC    Component Value Date/Time   WBC 4.9 01/05/2021 1003   RBC 5.00 01/05/2021 1003   HGB 16.0 01/19/2021 0816   HCT 47.0 01/19/2021 0816   PLT 192.0 01/05/2021 1003   MCV 92.5 01/05/2021 1003   MCH 31.2 03/26/2019 1202   MCHC 33.9 01/05/2021 1003   RDW 13.5 01/05/2021 1003   LYMPHSABS 1.1 01/05/2021 1003   MONOABS 0.8 01/05/2021 1003   EOSABS 0.2 01/05/2021 1003   BASOSABS 0.1 01/05/2021  1003    BMET    Component Value Date/Time   NA 138 01/19/2021 0816   K 3.9 01/19/2021 0816   CL 102 01/19/2021 0816   CO2 31 01/05/2021 1003   GLUCOSE 111 (H) 01/19/2021 0816   BUN 11 01/19/2021 0816   CREATININE 0.80 01/19/2021 0816   CALCIUM 9.3 01/05/2021 1003   GFRNONAA >60 03/26/2019 1202   GFRAA >60 03/26/2019 1202    BNP No results found for: "BNP"   Imaging:  No results found.        No data to display          No results found for: "NITRICOXIDE"      Assessment & Plan:   OBSTRUCTIVE SLEEP APNEA History of severe OSA on CPAP. He has excellent compliance and continues to receive good benefit from use. Good control of events with residual AHI 1.4. No adjustments made today.   Patient Instructions  Continue to use CPAP every night, minimum of 4-6 hours a night.  Change equipment every 30 days or as directed by DME. Wash your tubing with warm soap and water daily, hang to dry. Wash humidifier portion weekly.  Be aware of reduced alertness and do not drive or operate heavy machinery if experiencing this or drowsiness.  Exercise encouraged, as tolerated. Avoid or decrease alcohol consumption and medications that make you more sleepy, if possible. Notify if persistent daytime sleepiness occurs even with consistent use of CPAP.  Continue flonase nasal spray 2 sprays each nostril daily Continue Xyzal 1 tab daily Continue singulair 1 tab At bedtime Continue Breo 1 puff daily. Brush tongue and rinse mouth afterwards Continue Albuterol inhaler 2 puffs every 6 hours as needed for shortness of breath or wheezing. Notify if symptoms persist despite rescue inhaler/neb use.    Try adding astelin 2 sprays each nostril Twice daily for nasal congestion  Follow up in one year with Dr. Halford Chessman or sooner if needed     COVID-19 virus infection Recent COVID infection; positive 9/29. He has recovered well and breathing is at his baseline. Some residual nasal congestion.  Advised he try adding astelin nasal spray for rhinitis and continue with other supportive care measures.    I spent 28 minutes of dedicated to the care of this patient on the date of this encounter to include pre-visit review of records, face-to-face time with  the patient discussing conditions above, post visit ordering of testing, clinical documentation with the electronic health record, making appropriate referrals as documented, and communicating necessary findings to members of the patients care team.  Clayton Bibles, NP 11/16/2021  Pt aware and understands NP's role.

## 2021-11-16 NOTE — Progress Notes (Signed)
Reviewed and agree with assessment/plan.   Chesley Mires, MD [facility] Pulmonary/Critical Care 11/16/2021, 5:12 PM Pager:  [phone]

## 2021-11-16 NOTE — Assessment & Plan Note (Signed)
Recent COVID infection; positive 9/29. He has recovered well and breathing is at his baseline. Some residual nasal congestion. Advised he try adding astelin nasal spray for rhinitis and continue with other supportive care measures.

## 2021-11-26 ENCOUNTER — Other Ambulatory Visit: Payer: Self-pay | Admitting: Internal Medicine

## 2021-11-26 ENCOUNTER — Other Ambulatory Visit (HOSPITAL_COMMUNITY): Payer: Self-pay

## 2021-11-26 DIAGNOSIS — G250 Essential tremor: Secondary | ICD-10-CM

## 2021-11-26 DIAGNOSIS — L280 Lichen simplex chronicus: Secondary | ICD-10-CM

## 2021-11-26 MED ORDER — GABAPENTIN 100 MG PO CAPS
100.0000 mg | ORAL_CAPSULE | Freq: Three times a day (TID) | ORAL | 0 refills | Status: DC
  Filled 2021-11-26: qty 270, 90d supply, fill #0

## 2021-11-27 ENCOUNTER — Other Ambulatory Visit (HOSPITAL_COMMUNITY): Payer: Self-pay

## 2021-12-25 ENCOUNTER — Other Ambulatory Visit (HOSPITAL_COMMUNITY): Payer: Self-pay

## 2021-12-25 MED ORDER — TADALAFIL 20 MG PO TABS
20.0000 mg | ORAL_TABLET | ORAL | 11 refills | Status: DC | PRN
  Filled 2021-12-25: qty 20, 20d supply, fill #0
  Filled 2021-12-29: qty 4, 4d supply, fill #0
  Filled 2022-01-26: qty 4, 4d supply, fill #1
  Filled 2022-04-12: qty 4, 4d supply, fill #2
  Filled 2022-06-02: qty 4, 4d supply, fill #3
  Filled 2022-07-26: qty 4, 4d supply, fill #4
  Filled 2022-12-13: qty 4, 4d supply, fill #5
  Filled 2022-12-14: qty 4, 30d supply, fill #5

## 2021-12-28 ENCOUNTER — Other Ambulatory Visit (HOSPITAL_COMMUNITY): Payer: Self-pay

## 2021-12-29 ENCOUNTER — Other Ambulatory Visit (HOSPITAL_COMMUNITY): Payer: Self-pay

## 2022-01-05 NOTE — Progress Notes (Signed)
Subjective:   Kenneth Sellers is a 66 y.o. male who presents for an Initial Medicare Annual Wellness Visit. I connected with  Clent Jacks on 01/06/22 by a audio enabled telemedicine application and verified that I am speaking with the correct person using two identifiers.  Patient Location: Home  Provider Location: Home Office  I discussed the limitations of evaluation and management by telemedicine. The patient expressed understanding and agreed to proceed.  Review of Systems    Deferred to PCP Cardiac Risk Factors include: advanced age (>41mn, >>96women);dyslipidemia;hypertension;male gender     Objective:    Today's Vitals   01/06/22 0839  PainSc: 2    There is no height or weight on file to calculate BMI.     01/06/2022    8:48 AM 01/19/2021    8:07 AM 03/26/2019   11:56 AM 10/30/2015   10:54 AM  Advanced Directives  Does Patient Have a Medical Advance Directive? Yes Yes No No  Type of Advance Directive Healthcare Power of AVenedy   Does patient want to make changes to medical advance directive? No - Patient declined     Copy of [place] Chart?  No - copy requested    Would patient like information on creating a medical advance directive?   Yes (ED - Information included in AVS)     Current Medications (verified) Outpatient Encounter Medications as of 01/06/2022  Medication Sig   albuterol (VENTOLIN HFA) 108 (90 Base) MCG/ACT inhaler Use 1 - 2 puffs as needed every 4 -  6 hours as needed for cough/wheeze   aliskiren (TEKTURNA) 150 MG tablet TAKE 1 TABLET BY MOUTH ONCE DAILY   AMBULATORY NON FORMULARY MEDICATION CPAP machine nightly   azelastine (ASTELIN) 0.1 % nasal spray Place 2 sprays into both nostrils 2 (two) times daily. Use in each nostril as directed   EPINEPHrine 0.3 mg/0.3 mL IJ SOAJ injection Inject 1 pen as directed as needed for systemic reactions   ezetimibe-simvastatin (VYTORIN) 10-20 MG tablet TAKE  1 TABLET BY MOUTH EVERY NIGHT AT BEDTIME   fluticasone (FLONASE) 50 MCG/ACT nasal spray Place 1 - 2 sprays in each nostril once a day   fluticasone furoate-vilanterol (BREO ELLIPTA) 200-25 MCG/ACT AEPB Inhale 1 puff into the lungs once daily   gabapentin (NEURONTIN) 100 MG capsule Take 1 capsule (100 mg total) by mouth 3 (three) times daily.   guaiFENesin-codeine (ROBITUSSIN AC) 100-10 MG/5ML syrup Take 5 mLs by mouth 3 times daily as needed for cough.   levocetirizine (XYZAL) 5 MG tablet Take 1 tablet (5 mg total) by mouth daily.   montelukast (SINGULAIR) 10 MG tablet Take 1 tablet (10 mg total) by mouth daily.   Multiple Vitamins-Minerals (MULTIVITAMIN ADULTS PO) Take 1 tablet by mouth daily.    Probiotic Product (RESTORA) CAPS TAKE 1 CAPSULE BY MOUTH DAILY.   tadalafil (CIALIS) 20 MG tablet Take 1 tablet (20 mg total) by mouth as needed.   azithromycin (ZITHROMAX) 250 MG tablet Take 2 tablets by mouth on day 1 then take 1 tablet daily on days 2-5. (Patient not taking: Reported on 11/16/2021)   fluocinonide cream (LIDEX) 0.05 % Apply topically 2 times daily. (Patient not taking: Reported on 01/06/2022)   traMADol (ULTRAM) 50 MG tablet Take 1 tablet (50 mg total) by mouth every 6 (six) hours as needed. (Patient not taking: Reported on 11/16/2021)   UNABLE TO FIND Allergy shots every 7 to 10 days  with dr Donneta Romberg (Patient not taking: Reported on 11/16/2021)   No facility-administered encounter medications on file as of 01/06/2022.    Allergies (verified) Patient has no known allergies.   History: Past Medical History:  Diagnosis Date   Allergic rhinitis    Dr. Donneta Romberg   Allergy    Arthritis    hands   Asthma    Cataract    small immature left eye   Diverticulosis of colon    DJD (degenerative joint disease)    lower back   History of kidney stones 2020   x 1 passed on own   Hyperlipidemia    Hypertension    Neuromuscular disorder ([place])    essential tremors   OSA (obstructive sleep  apnea)    PSG 08/26/02 AHI 59--CPAP 7 cm   Phimosis 01/14/2021   Pneumonia 1988   treated as OP   Sleep apnea    wears cpap    Tachycardia 2003   Dr. Mare Ferrari, negative cardiac work up   Past Surgical History:  Procedure Laterality Date   CIRCUMCISION N/A 01/19/2021   Procedure: CIRCUMCISION ADULT;  Surgeon: Franchot Gallo, MD;  Location: [facility];  Service: Urology;  Laterality: N/A;   colon tics  03/2005   [facility]   COLONOSCOPY  03/2005   colonscopy  12/25/2018   EYE SURGERY Right 2020   right ioc lens replacement for cataract   lesion removed L neck; pigmented mole at hairline, non melanoma  00/[id number]   NISSEN FUNDOPLICATION  0076    Dr Margot Chimes; complicated by herniation post op with retching requiring 2nd surgery   POLYPECTOMY     RHINOPLASTY  1983   Family History  Problem Relation Age of Onset   Heart attack Father 81   Diabetes Mother    Hypertension Mother    Stroke Mother 11   Heart failure Mother    Diabetes Sister    Allergies Sister    Hypertension Brother    Heart disease Brother    Diabetes Brother        2 brothers   Heart attack Brother 13       smoker   COPD Brother        died in his 22s   Arthritis Sister    Cancer Neg Hx    Early death Neg Hx    Hyperlipidemia Neg Hx    Kidney disease Neg Hx    Colon cancer Neg Hx    Colon polyps Neg Hx    Esophageal cancer Neg Hx    Rectal cancer Neg Hx    Stomach cancer Neg Hx    Social History   Socioeconomic History   Marital status: Married    Spouse name: Bevely Palmer   Number of children: Not on file   Years of education: Not on file   Highest education level: Not on file  Occupational History   Occupation: retired    Comment: Pharmacist, hospital  Tobacco Use   Smoking status: Never   Smokeless tobacco: Never  Vaping Use   Vaping Use: Never used  Substance and Sexual Activity   Alcohol use: Yes    Alcohol/week: 2.0 standard drinks of alcohol    Types: 2 Cans of beer per week     Comment: occasionally   Drug use: No   Sexual activity: Yes  Other Topics Concern   Not on file  Social History Narrative   Not on file   Social Determinants of Health  Financial Resource Strain: Low Risk  (01/06/2022)   Overall Financial Resource Strain (CARDIA)    Difficulty of Paying Living Expenses: Not hard at all  Food Insecurity: No Food Insecurity (01/06/2022)   Hunger Vital Sign    Worried About Running Out of Food in the Last Year: Never true    Ran Out of Food in the Last Year: Never true  Transportation Needs: No Transportation Needs (01/06/2022)   PRAPARE - Hydrologist (Medical): No    Lack of Transportation (Non-Medical): No  Physical Activity: Sufficiently Active (01/06/2022)   Exercise Vital Sign    Days of Exercise per Week: 4 days    Minutes of Exercise per Session: 40 min  Stress: No Stress Concern Present (01/06/2022)   [place]    Feeling of Stress : Not at all  Social Connections: Unknown (01/06/2022)   Social Connection and Isolation Panel [NHANES]    Frequency of Communication with Friends and Family: More than three times a week    Frequency of Social Gatherings with Friends and Family: More than three times a week    Attends Religious Services: Not on Advertising copywriter or Organizations: Not on file    Attends Archivist Meetings: Not on file    Marital Status: Not on file    Tobacco Counseling Counseling given: Not Answered   Clinical Intake:  Pre-visit preparation completed: Yes  Pain : 0-10 Pain Score: 2  Pain Type: Chronic pain Pain Location: Generalized Pain Orientation: Right Pain Descriptors / Indicators: Aching, Discomfort, Dull, Burning Pain Relieving Factors: medication  Pain Relieving Factors: medication  Nutritional Status: BMI 25 -29 Overweight Nutritional Risks: None Diabetes: No  How often do  you need to have someone help you when you read instructions, pamphlets, or other written materials from your doctor or pharmacy?: 1 - Never What is the last grade level you completed in school?: college  Diabetic?No  Interpreter Needed?: No  Information entered by :: Emelia Loron RN   Activities of Daily Living    01/06/2022    8:47 AM 01/19/2021    8:17 AM  In your present state of health, do you have any difficulty performing the following activities:  Hearing? 0 0  Vision? 0 0  Difficulty concentrating or making decisions? 0 0  Walking or climbing stairs? 0 0  Dressing or bathing? 0 0  Doing errands, shopping? 0   Preparing Food and eating ? N   Using the Toilet? N   In the past six months, have you accidently leaked urine? N   Do you have problems with loss of bowel control? N   Managing your Medications? N   Managing your Finances? N   Housekeeping or managing your Housekeeping? N     Patient Care Team: Janith Lima, MD as PCP - General (Internal Medicine)  Indicate any recent Medical Services you may have received from other than [facility] providers in the past year (date may be approximate).     Assessment:   This is a routine wellness examination for Infant.  Hearing/Vision screen No results found.  Dietary issues and exercise activities discussed: Current Exercise Habits: Home exercise routine, Type of exercise: walking, Time (Minutes): 30, Frequency (Times/Week): 3, Weekly Exercise (Minutes/Week): 90, Intensity: Mild, Exercise limited by: orthopedic condition(s)   Goals Addressed             This  Visit's Progress    Patient Stated       I want to start to go back to the gym.       Depression Screen    01/06/2022    8:49 AM 10/14/2021    3:45 PM 07/07/2021    9:05 AM 01/05/2021    8:59 AM 12/31/2019    8:52 AM 12/28/2018   11:14 AM 11/16/2017    8:10 AM  PHQ 2/9 Scores  PHQ - 2 Score 0 1 0 0 0 0 0  PHQ- 9 Score   2        Fall Risk     01/06/2022    8:50 AM 10/14/2021    3:44 PM 01/05/2021    8:56 AM 12/31/2019    8:52 AM 12/28/2018   11:14 AM  [employer] in the past year? 0 1 0 0 0  Number falls in past yr: 0 0   0  Injury with Fall? 0 1   0  Risk for fall due to : No Fall Risks History of fall(s)     Follow up Falls evaluation completed    Falls evaluation completed    [place]:  Any stairs in or around the home? Yes  If so, are there any without handrails? Yes  Home free of loose throw rugs in walkways, pet beds, electrical cords, etc? Yes  Adequate lighting in your home to reduce risk of falls? Yes   ASSISTIVE DEVICES UTILIZED TO PREVENT FALLS:  Life alert? No  Use of a cane, walker or w/c? No  Grab bars in the bathroom? No  Shower chair or bench in shower? No  Elevated toilet seat or a handicapped toilet? No   Cognitive Function:        01/06/2022    8:50 AM  6CIT Screen  What Year? 0 points  What month? 0 points  What time? 0 points  Count back from 20 0 points  Months in reverse 0 points  Repeat phrase 0 points  Total Score 0 points    Immunizations Immunization History  Administered Date(s) Administered   Fluad Quad(high Dose 65+) 11/18/2018, 11/22/2019   Influenza Whole 01/10/2007, 10/28/2009   Influenza, High Dose Seasonal PF 01/21/2017, 01/25/2018, 01/24/2019   Influenza,inj,Quad PF,6+ Mos 12/14/2012, 11/21/2014, 11/25/2015, 11/27/2016, 11/16/2017   Influenza-Unspecified 11/11/2020   PFIZER Comirnaty(Gray Top)Covid-19 Tri-Sucrose Vaccine 06/20/2020   PFIZER(Purple Top)SARS-COV-2 Vaccination 02/26/2019, 04/02/2019, 07/30/2019, 11/13/2019   Pfizer Covid-19 Vaccine Bivalent Booster 32yr & up 11/04/2020   Pneumococcal Conjugate-13 12/28/2018   Pneumococcal Polysaccharide-23 04/27/2017   Tdap 09/16/2014   Zoster Recombinat (Shingrix) 01/05/2021, 04/09/2021    Flu Vaccine status: Due, Education has been provided regarding the importance of  this vaccine. Advised may receive this vaccine at local pharmacy or Health Dept. Aware to provide a copy of the vaccination record if obtained from local pharmacy or Health Dept. Verbalized acceptance and understanding.  Pneumococcal vaccine status: Up to date  Covid-19 vaccine status: Information provided on how to obtain vaccines.   Qualifies for Shingles Vaccine? Yes   Zostavax completed No   Shingrix Completed?: Yes  Screening Tests Health Maintenance  Topic Date Due   COVID-19 Vaccine (7 - 2023-24 season) 10/09/2021   INFLUENZA VACCINE  05/09/2022 (Originally 09/08/2021)   Pneumonia Vaccine 68 Years old (3 - PPSV23 or PCV20) 04/28/2022   Medicare Annual Wellness (AWV)  01/07/2023   COLONOSCOPY (Pts 45-456yrInsurance coverage will need to  be confirmed)  12/25/2023   Hepatitis C Screening  Completed   Zoster Vaccines- Shingrix  Completed   HPV VACCINES  Aged Out    Health Maintenance  Health Maintenance Due  Topic Date Due   COVID-19 Vaccine (7 - 2023-24 season) 10/09/2021    Colorectal cancer screening: Type of screening: Colonoscopy. Completed 12/25/18. Repeat every 5 years  Lung Cancer Screening: (Low Dose CT Chest recommended if Age 60-80 years, 30 pack-year currently smoking OR have quit w/in 15years.) does not qualify.   Additional Screening:  Hepatitis C Screening: does qualify; Completed 09/23/15  Vision Screening: Recommended annual ophthalmology exams for early detection of glaucoma and other disorders of the eye. Is the patient up to date with their annual eye exam?  Yes  Who is the provider or what is the name of the office in which the patient attends annual eye exams? [facility] If pt is not established with a provider, would they like to be referred to a provider to establish care?  N/A .   Dental Screening: Recommended annual dental exams for proper oral hygiene  Community Resource Referral / Chronic Care Management: CRR required this  visit?  No   CCM required this visit?  No      Plan:     I have personally reviewed and noted the following in the patient's chart:   Medical and social history Use of alcohol, tobacco or illicit drugs  Current medications and supplements including opioid prescriptions. Patient is not currently taking opioid prescriptions. Functional ability and status Nutritional status Physical activity Advanced directives List of other physicians Hospitalizations, surgeries, and ER visits in previous 12 months Vitals Screenings to include cognitive, depression, and falls Referrals and appointments  In addition, I have reviewed and discussed with patient certain preventive protocols, quality metrics, and best practice recommendations. A written personalized care plan for preventive services as well as general preventive health recommendations were provided to patient.     Michiel Cowboy, RN   01/06/2022   Nurse Notes:  Mr. Blackwelder , Thank you for taking time to come for your Medicare Wellness Visit. I appreciate your ongoing commitment to your health goals. Please review the following plan we discussed and let me know if I can assist you in the future.   These are the goals we discussed:  Goals      Patient Stated     I want to start to go back to the gym.         This is a list of the screening recommended for you and due dates:  Health Maintenance  Topic Date Due   COVID-19 Vaccine (7 - 2023-24 season) 10/09/2021   Flu Shot  05/09/2022*   Pneumonia Vaccine (3 - PPSV23 or PCV20) 04/28/2022   Medicare Annual Wellness Visit  01/07/2023   Colon Cancer Screening  12/25/2023   Hepatitis C Screening: USPSTF Recommendation to screen - Ages 18-79 yo.  Completed   Zoster (Shingles) Vaccine  Completed   HPV Vaccine  Aged Out  *Topic was postponed. The date shown is not the original due date.

## 2022-01-05 NOTE — Patient Instructions (Signed)
Health Maintenance, Male Adopting a healthy lifestyle and getting preventive care are important in promoting health and wellness. Ask your health care provider about: The right schedule for you to have regular tests and exams. Things you can do on your own to prevent diseases and keep yourself healthy. What should I know about diet, weight, and exercise? Eat a healthy diet  Eat a diet that includes plenty of vegetables, fruits, low-fat dairy products, and lean protein. Do not eat a lot of foods that are high in solid fats, added sugars, or sodium. Maintain a healthy weight Body mass index (BMI) is a measurement that can be used to identify possible weight problems. It estimates body fat based on height and weight. Your health care provider can help determine your BMI and help you achieve or maintain a healthy weight. Get regular exercise Get regular exercise. This is one of the most important things you can do for your health. Most adults should: Exercise for at least 150 minutes each week. The exercise should increase your heart rate and make you sweat (moderate-intensity exercise). Do strengthening exercises at least twice a week. This is in addition to the moderate-intensity exercise. Spend less time sitting. Even light physical activity can be beneficial. Watch cholesterol and blood lipids Have your blood tested for lipids and cholesterol at 68 years of age, then have this test every 5 years. You may need to have your cholesterol levels checked more often if: Your lipid or cholesterol levels are high. You are older than 68 years of age. You are at high risk for heart disease. What should I know about cancer screening? Many types of cancers can be detected early and may often be prevented. Depending on your health history and family history, you may need to have cancer screening at various ages. This may include screening for: Colorectal cancer. Prostate cancer. Skin cancer. Lung  cancer. What should I know about heart disease, diabetes, and high blood pressure? Blood pressure and heart disease High blood pressure causes heart disease and increases the risk of stroke. This is more likely to develop in people who have high blood pressure readings or are overweight. Talk with your health care provider about your target blood pressure readings. Have your blood pressure checked: Every 3-5 years if you are 18-39 years of age. Every year if you are 40 years old or older. If you are between the ages of 65 and 75 and are a current or former smoker, ask your health care provider if you should have a one-time screening for abdominal aortic aneurysm (AAA). Diabetes Have regular diabetes screenings. This checks your fasting blood sugar level. Have the screening done: Once every three years after age 45 if you are at a normal weight and have a low risk for diabetes. More often and at a younger age if you are overweight or have a high risk for diabetes. What should I know about preventing infection? Hepatitis B If you have a higher risk for hepatitis B, you should be screened for this virus. Talk with your health care provider to find out if you are at risk for hepatitis B infection. Hepatitis C Blood testing is recommended for: Everyone born from 1945 through 1965. Anyone with known risk factors for hepatitis C. Sexually transmitted infections (STIs) You should be screened each year for STIs, including gonorrhea and chlamydia, if: You are sexually active and are younger than 68 years of age. You are older than 68 years of age and your   health care provider tells you that you are at risk for this type of infection. Your sexual activity has changed since you were last screened, and you are at increased risk for chlamydia or gonorrhea. Ask your health care provider if you are at risk. Ask your health care provider about whether you are at high risk for HIV. Your health care provider  may recommend a prescription medicine to help prevent HIV infection. If you choose to take medicine to prevent HIV, you should first get tested for HIV. You should then be tested every 3 months for as long as you are taking the medicine. Follow these instructions at home: Alcohol use Do not drink alcohol if your health care provider tells you not to drink. If you drink alcohol: Limit how much you have to 0-2 drinks a day. Know how much alcohol is in your drink. In the [place] one drink equals one 12 oz bottle of beer (355 mL), one 5 oz glass of Alysia Scism (148 mL), or one 1 oz glass of hard liquor (44 mL). Lifestyle Do not use any products that contain nicotine or tobacco. These products include cigarettes, chewing tobacco, and vaping devices, such as e-cigarettes. If you need help quitting, ask your health care provider. Do not use street drugs. Do not share needles. Ask your health care provider for help if you need support or information about quitting drugs. General instructions Schedule regular health, dental, and eye exams. Stay current with your vaccines. Tell your health care provider if: You often feel depressed. You have ever been abused or do not feel safe at home. Summary Adopting a healthy lifestyle and getting preventive care are important in promoting health and wellness. Follow your health care provider's instructions about healthy diet, exercising, and getting tested or screened for diseases. Follow your health care provider's instructions on monitoring your cholesterol and blood pressure. This information is not intended to replace advice given to you by your health care provider. Make sure you discuss any questions you have with your health care provider. Document Revised: 06/16/2020 Document Reviewed: 06/16/2020 Elsevier Patient Education  2023 [employer]  

## 2022-01-06 ENCOUNTER — Ambulatory Visit (INDEPENDENT_AMBULATORY_CARE_PROVIDER_SITE_OTHER): Payer: Medicare PPO | Admitting: *Deleted

## 2022-01-06 DIAGNOSIS — Z0000 Encounter for general adult medical examination without abnormal findings: Secondary | ICD-10-CM | POA: Diagnosis not present

## 2022-01-07 ENCOUNTER — Ambulatory Visit (INDEPENDENT_AMBULATORY_CARE_PROVIDER_SITE_OTHER): Payer: Medicare PPO | Admitting: Internal Medicine

## 2022-01-07 ENCOUNTER — Other Ambulatory Visit (HOSPITAL_COMMUNITY): Payer: Self-pay

## 2022-01-07 ENCOUNTER — Encounter: Payer: Self-pay | Admitting: Neurology

## 2022-01-07 ENCOUNTER — Encounter: Payer: Self-pay | Admitting: Internal Medicine

## 2022-01-07 VITALS — BP 136/84 | HR 72 | Temp 97.8°F | Resp 16 | Ht 69.5 in | Wt 175.0 lb

## 2022-01-07 DIAGNOSIS — G250 Essential tremor: Secondary | ICD-10-CM | POA: Diagnosis not present

## 2022-01-07 DIAGNOSIS — Z0001 Encounter for general adult medical examination with abnormal findings: Secondary | ICD-10-CM

## 2022-01-07 DIAGNOSIS — R739 Hyperglycemia, unspecified: Secondary | ICD-10-CM | POA: Diagnosis not present

## 2022-01-07 DIAGNOSIS — Z125 Encounter for screening for malignant neoplasm of prostate: Secondary | ICD-10-CM | POA: Diagnosis not present

## 2022-01-07 DIAGNOSIS — I10 Essential (primary) hypertension: Secondary | ICD-10-CM

## 2022-01-07 DIAGNOSIS — L280 Lichen simplex chronicus: Secondary | ICD-10-CM | POA: Diagnosis not present

## 2022-01-07 DIAGNOSIS — E785 Hyperlipidemia, unspecified: Secondary | ICD-10-CM

## 2022-01-07 LAB — TSH: TSH: 3.39 u[IU]/mL (ref 0.35–5.50)

## 2022-01-07 LAB — LIPID PANEL
Cholesterol: 160 mg/dL (ref 0–200)
HDL: 64.3 mg/dL (ref >39.00)
LDL Cholesterol: 79 mg/dL (ref 0–99)
NonHDL: 95.29
Total CHOL/HDL Ratio: 2
Triglycerides: 82 mg/dL (ref 0.0–149.0)
VLDL: 16.4 mg/dL (ref 0.0–40.0)

## 2022-01-07 LAB — CBC WITH DIFFERENTIAL/PLATELET
Basophils Absolute: 0.1 10*3/uL (ref 0.0–0.1)
Basophils Relative: 1.4 % (ref 0.0–3.0)
Eosinophils Absolute: 0.3 10*3/uL (ref 0.0–0.7)
Eosinophils Relative: 4.3 % (ref 0.0–5.0)
HCT: 48.5 % (ref 39.0–52.0)
Hemoglobin: 16.3 g/dL (ref 13.0–17.0)
Lymphocytes Relative: 22 % (ref 12.0–46.0)
Lymphs Abs: 1.3 10*3/uL (ref 0.7–4.0)
MCHC: 33.7 g/dL (ref 30.0–36.0)
MCV: 91.3 fl (ref 78.0–100.0)
Monocytes Absolute: 0.9 10*3/uL (ref 0.1–1.0)
Monocytes Relative: 16.1 % — ABNORMAL HIGH (ref 3.0–12.0)
Neutro Abs: 3.3 10*3/uL (ref 1.4–7.7)
Neutrophils Relative %: 56.2 % (ref 43.0–77.0)
Platelets: 192 10*3/uL (ref 150.0–400.0)
RBC: 5.31 Mil/uL (ref 4.22–5.81)
RDW: 13.9 % (ref 11.5–15.5)
WBC: 5.8 10*3/uL (ref 4.0–10.5)

## 2022-01-07 LAB — URINALYSIS, ROUTINE W REFLEX MICROSCOPIC
Bilirubin Urine: NEGATIVE
Hgb urine dipstick: NEGATIVE
Leukocytes,Ua: NEGATIVE
Nitrite: NEGATIVE
RBC / HPF: NONE SEEN (ref 0–?)
Specific Gravity, Urine: >=1.03 — AB (ref 1.000–1.030)
Total Protein, Urine: NEGATIVE
Urine Glucose: NEGATIVE
Urobilinogen, UA: 0.2 (ref 0.0–1.0)
pH: 6 (ref 5.0–8.0)

## 2022-01-07 LAB — BASIC METABOLIC PANEL
BUN: 16 mg/dL (ref 6–23)
CO2: 32 mEq/L (ref 19–32)
Calcium: 9.4 mg/dL (ref 8.4–10.5)
Chloride: 101 mEq/L (ref 96–112)
Creatinine, Ser: 0.91 mg/dL (ref 0.40–1.50)
GFR: 86.76 mL/min (ref >60.00)
Glucose, Bld: 94 mg/dL (ref 70–99)
Potassium: 4.1 mEq/L (ref 3.5–5.1)
Sodium: 139 mEq/L (ref 135–145)

## 2022-01-07 LAB — HEPATIC FUNCTION PANEL
ALT: 22 U/L (ref 0–53)
AST: 25 U/L (ref 0–37)
Albumin: 4.7 g/dL (ref 3.5–5.2)
Alkaline Phosphatase: 53 U/L (ref 39–117)
Bilirubin, Direct: 0.2 mg/dL (ref 0.0–0.3)
Total Bilirubin: 0.9 mg/dL (ref 0.2–1.2)
Total Protein: 6.8 g/dL (ref 6.0–8.3)

## 2022-01-07 LAB — PSA: PSA: 0.65 ng/mL (ref 0.10–4.00)

## 2022-01-07 LAB — HEMOGLOBIN A1C: Hgb A1c MFr Bld: 5.8 % (ref 4.6–6.5)

## 2022-01-07 MED ORDER — GABAPENTIN 100 MG PO CAPS
200.0000 mg | ORAL_CAPSULE | Freq: Three times a day (TID) | ORAL | 0 refills | Status: DC
  Filled 2022-01-07 – 2022-02-22 (×2): qty 540, 90d supply, fill #0

## 2022-01-07 NOTE — Patient Instructions (Signed)
Health Maintenance, Male Adopting a healthy lifestyle and getting preventive care are important in promoting health and wellness. Ask your health care provider about: The right schedule for you to have regular tests and exams. Things you can do on your own to prevent diseases and keep yourself healthy. What should I know about diet, weight, and exercise? Eat a healthy diet  Eat a diet that includes plenty of vegetables, fruits, low-fat dairy products, and lean protein. Do not eat a lot of foods that are high in solid fats, added sugars, or sodium. Maintain a healthy weight Body mass index (BMI) is a measurement that can be used to identify possible weight problems. It estimates body fat based on height and weight. Your health care provider can help determine your BMI and help you achieve or maintain a healthy weight. Get regular exercise Get regular exercise. This is one of the most important things you can do for your health. Most adults should: Exercise for at least 150 minutes each week. The exercise should increase your heart rate and make you sweat (moderate-intensity exercise). Do strengthening exercises at least twice a week. This is in addition to the moderate-intensity exercise. Spend less time sitting. Even light physical activity can be beneficial. Watch cholesterol and blood lipids Have your blood tested for lipids and cholesterol at 68 years of age, then have this test every 5 years. You may need to have your cholesterol levels checked more often if: Your lipid or cholesterol levels are high. You are older than 68 years of age. You are at high risk for heart disease. What should I know about cancer screening? Many types of cancers can be detected early and may often be prevented. Depending on your health history and family history, you may need to have cancer screening at various ages. This may include screening for: Colorectal cancer. Prostate cancer. Skin cancer. Lung  cancer. What should I know about heart disease, diabetes, and high blood pressure? Blood pressure and heart disease High blood pressure causes heart disease and increases the risk of stroke. This is more likely to develop in people who have high blood pressure readings or are overweight. Talk with your health care provider about your target blood pressure readings. Have your blood pressure checked: Every 3-5 years if you are 18-39 years of age. Every year if you are 40 years old or older. If you are between the ages of 65 and 75 and are a current or former smoker, ask your health care provider if you should have a one-time screening for abdominal aortic aneurysm (AAA). Diabetes Have regular diabetes screenings. This checks your fasting blood sugar level. Have the screening done: Once every three years after age 45 if you are at a normal weight and have a low risk for diabetes. More often and at a younger age if you are overweight or have a high risk for diabetes. What should I know about preventing infection? Hepatitis B If you have a higher risk for hepatitis B, you should be screened for this virus. Talk with your health care provider to find out if you are at risk for hepatitis B infection. Hepatitis C Blood testing is recommended for: Everyone born from 1945 through 1965. Anyone with known risk factors for hepatitis C. Sexually transmitted infections (STIs) You should be screened each year for STIs, including gonorrhea and chlamydia, if: You are sexually active and are younger than 68 years of age. You are older than 68 years of age and your   health care provider tells you that you are at risk for this type of infection. Your sexual activity has changed since you were last screened, and you are at increased risk for chlamydia or gonorrhea. Ask your health care provider if you are at risk. Ask your health care provider about whether you are at high risk for HIV. Your health care provider  may recommend a prescription medicine to help prevent HIV infection. If you choose to take medicine to prevent HIV, you should first get tested for HIV. You should then be tested every 3 months for as long as you are taking the medicine. Follow these instructions at home: Alcohol use Do not drink alcohol if your health care provider tells you not to drink. If you drink alcohol: Limit how much you have to 0-2 drinks a day. Know how much alcohol is in your drink. In the [place] one drink equals one 12 oz bottle of beer (355 mL), one 5 oz glass of wine (148 mL), or one 1 oz glass of hard liquor (44 mL). Lifestyle Do not use any products that contain nicotine or tobacco. These products include cigarettes, chewing tobacco, and vaping devices, such as e-cigarettes. If you need help quitting, ask your health care provider. Do not use street drugs. Do not share needles. Ask your health care provider for help if you need support or information about quitting drugs. General instructions Schedule regular health, dental, and eye exams. Stay current with your vaccines. Tell your health care provider if: You often feel depressed. You have ever been abused or do not feel safe at home. Summary Adopting a healthy lifestyle and getting preventive care are important in promoting health and wellness. Follow your health care provider's instructions about healthy diet, exercising, and getting tested or screened for diseases. Follow your health care provider's instructions on monitoring your cholesterol and blood pressure. This information is not intended to replace advice given to you by your health care provider. Make sure you discuss any questions you have with your health care provider. Document Revised: 06/16/2020 Document Reviewed: 06/16/2020 Elsevier Patient Education  2023 [employer] Inc[employer]  

## 2022-01-07 NOTE — Progress Notes (Unsigned)
Subjective:  Patient ID: Kenneth Sellers, male    DOB: 31-Jan-1956  Age: 66 y.o. MRN: [id number]  CC: Annual Exam, Hypertension, and Hyperlipidemia   HPI Kenneth Sellers presents for a CPX and f/up -  He complains of worsening tremors and would like to increase the dose of gabapentin. He complains of tingling in his right lower extremity and decreasing fine motor capabilities.  He is very active, manages 3 acres, and his endurance is good.  He denies chest pain, shortness of breath, diaphoresis, dizziness, lightheadedness, or edema.  Outpatient Medications Prior to Visit  Medication Sig Dispense Refill   albuterol (VENTOLIN HFA) 108 (90 Base) MCG/ACT inhaler Use 1 - 2 puffs as needed every 4 -  6 hours as needed for cough/wheeze 18 g 0   AMBULATORY NON FORMULARY MEDICATION CPAP machine nightly     azelastine (ASTELIN) 0.1 % nasal spray Place 2 sprays into both nostrils 2 (two) times daily. Use in each nostril as directed 30 mL 5   EPINEPHrine 0.3 mg/0.3 mL IJ SOAJ injection Inject 1 pen as directed as needed for systemic reactions 1 each 1   fluocinonide cream (LIDEX) 0.05 % Apply topically 2 times daily. (Patient not taking: Reported on 01/06/2022) 30 g 0   fluticasone (FLONASE) 50 MCG/ACT nasal spray Place 1 - 2 sprays in each nostril once a day 48 g 1   fluticasone furoate-vilanterol (BREO ELLIPTA) 200-25 MCG/ACT AEPB Inhale 1 puff into the lungs once daily 60 each 3   levocetirizine (XYZAL) 5 MG tablet Take 1 tablet (5 mg total) by mouth daily. 90 tablet 1   montelukast (SINGULAIR) 10 MG tablet Take 1 tablet (10 mg total) by mouth daily. 90 tablet 2   Multiple Vitamins-Minerals (MULTIVITAMIN ADULTS PO) Take 1 tablet by mouth daily.      Probiotic Product (RESTORA) CAPS TAKE 1 CAPSULE BY MOUTH DAILY. 90 capsule 1   tadalafil (CIALIS) 20 MG tablet Take 1 tablet (20 mg total) by mouth as needed. 20 tablet 11   UNABLE TO FIND Allergy shots every 7 to 10 days with dr Donneta Romberg (Patient not taking:  Reported on 11/16/2021)     aliskiren (TEKTURNA) 150 MG tablet TAKE 1 TABLET BY MOUTH ONCE DAILY 90 tablet 0   azithromycin (ZITHROMAX) 250 MG tablet Take 2 tablets by mouth on day 1 then take 1 tablet daily on days 2-5. (Patient not taking: Reported on 11/16/2021) 6 tablet 0   ezetimibe-simvastatin (VYTORIN) 10-20 MG tablet TAKE 1 TABLET BY MOUTH EVERY NIGHT AT BEDTIME 90 tablet 1   gabapentin (NEURONTIN) 100 MG capsule Take 1 capsule (100 mg total) by mouth 3 (three) times daily. 270 capsule 0   guaiFENesin-codeine (ROBITUSSIN AC) 100-10 MG/5ML syrup Take 5 mLs by mouth 3 times daily as needed for cough. 120 mL 1   traMADol (ULTRAM) 50 MG tablet Take 1 tablet (50 mg total) by mouth every 6 (six) hours as needed. (Patient not taking: Reported on 11/16/2021) 10 tablet 0   No facility-administered medications prior to visit.    ROS Review of Systems  Constitutional: Negative.  Negative for appetite change, diaphoresis and fatigue.  HENT: Negative.    Eyes: Negative.   Respiratory: Negative.  Negative for cough, chest tightness, shortness of breath and wheezing.   Cardiovascular:  Negative for chest pain, palpitations and leg swelling.  Gastrointestinal:  Negative for abdominal pain, diarrhea, nausea and vomiting.  Endocrine: Negative.   Genitourinary: Negative.  Negative for difficulty urinating.  Musculoskeletal: Negative.  Negative for arthralgias and myalgias.  Skin: Negative.   Allergic/Immunologic: Negative.   Neurological:  Positive for tremors. Negative for dizziness, weakness and light-headedness.  Hematological:  Negative for adenopathy. Does not bruise/bleed easily.    Objective:  BP 136/84 (BP Location: Right Arm, Patient Position: Sitting, Cuff Size: Large)   Pulse 72   Temp 97.8 F (36.6 C) (Oral)   Resp 16   Ht 5' 9.5" (1.765 m)   Wt 175 lb (79.4 kg)   SpO2 98%   BMI 25.47 kg/m   BP Readings from Last 3 Encounters:  01/07/22 136/84  11/16/21 130/80  10/14/21  132/74    Wt Readings from Last 3 Encounters:  01/07/22 175 lb (79.4 kg)  11/16/21 183 lb 3.2 oz (83.1 kg)  10/14/21 181 lb 6 oz (82.3 kg)   Welcome Physical Exam Vitals reviewed.  HENT:     Mouth/Throat:     Mouth: Mucous membranes are moist.  Eyes:     General: No scleral icterus.    Conjunctiva/sclera: Conjunctivae normal.  Cardiovascular:     Rate and Rhythm: Normal rate and regular rhythm.     Heart sounds: No murmur heard.    Comments: EKG- NSR with SA, 68 bpm ?LAE No LVH or Q waves Pulmonary:     Effort: Pulmonary effort is normal.     Breath sounds: No stridor. No wheezing, rhonchi or rales.  Abdominal:     General: Abdomen is flat.     Palpations: There is no mass.     Tenderness: There is no abdominal tenderness. There is no guarding.     Hernia: No hernia is present. There is no hernia in the left inguinal area or right inguinal area.  Genitourinary:    Pubic Area: No rash.      Penis: Normal and circumcised.      Testes: Normal.     Epididymis:     Right: Normal.     Left: Normal.     Prostate: Normal. Not enlarged, not tender and no nodules present.     Rectum: Normal. Guaiac result negative. No mass, tenderness, anal fissure, external hemorrhoid or internal hemorrhoid. Normal anal tone.  Musculoskeletal:        General: Normal range of motion.     Cervical back: Neck supple.     Right lower leg: No edema.     Left lower leg: No edema.  Lymphadenopathy:     Cervical: No cervical adenopathy.     Lower Body: No right inguinal adenopathy. No left inguinal adenopathy.  Skin:    General: Skin is warm and dry.     Coloration: Skin is not pale.  Neurological:     General: No focal deficit present.     Mental Status: He is alert.  Psychiatric:        Mood and Affect: Mood normal.        Behavior: Behavior normal.     Lab Results  Component Value Date   WBC 5.8 01/07/2022   HGB 16.3 01/07/2022   HCT 48.5 01/07/2022   PLT 192.0 01/07/2022    GLUCOSE 94 01/07/2022   CHOL 160 01/07/2022   TRIG 82.0 01/07/2022   HDL 64.30 01/07/2022   LDLCALC 79 01/07/2022   ALT 22 01/07/2022   AST 25 01/07/2022   NA 139 01/07/2022   K 4.1 01/07/2022   CL 101 01/07/2022   CREATININE 0.91 01/07/2022   BUN 16 01/07/2022   CO2 32  01/07/2022   TSH 3.39 01/07/2022   PSA 0.65 01/07/2022   HGBA1C 5.8 01/07/2022    No results found.  Assessment & Plan:   Kalon was seen today for annual exam, hypertension and hyperlipidemia.  Diagnoses and all orders for this visit:  Primary hypertension- His blood pressure is adequately well-controlled.  His EKG is reassuring.  Labs are negative for secondary causes or endorgan damage. -     TSH; Future -     Urinalysis, Routine w reflex microscopic; Future -     CBC with Differential/Platelet; Future -     Basic metabolic panel; Future -     EKG 12-Lead -     Basic metabolic panel -     CBC with Differential/Platelet -     Urinalysis, Routine w reflex microscopic -     TSH -     aliskiren (TEKTURNA) 150 MG tablet; TAKE 1 TABLET BY MOUTH ONCE DAILY  Hyperlipidemia with target LDL less than 130- LDL goal achieved. Doing well on the statin  -     Lipid panel; Future -     TSH; Future -     Hepatic function panel; Future -     Hepatic function panel -     TSH -     Lipid panel -     ezetimibe-simvastatin (VYTORIN) 10-20 MG tablet; TAKE 1 TABLET BY MOUTH EVERY NIGHT AT BEDTIME  Hyperglycemia -     Hemoglobin A1c; Future -     Basic metabolic panel; Future -     Basic metabolic panel -     Hemoglobin A1c  Benign essential tremor -     Ambulatory referral to Neurology -     gabapentin (NEURONTIN) 100 MG capsule; Take 2 capsules (200 mg total) by mouth 3 (three) times daily.  Screening for prostate cancer -     PSA; Future -     PSA  Neurodermatitis, localized -     gabapentin (NEURONTIN) 100 MG capsule; Take 2 capsules (200 mg total) by mouth 3 (three) times daily.  White coat syndrome with  diagnosis of hypertension   I have discontinued Julious L. Aponte's traMADol, azithromycin, and guaiFENesin-codeine. I have also changed his gabapentin. Additionally, I am having him maintain his AMBULATORY NON FORMULARY MEDICATION, Multiple Vitamins-Minerals (MULTIVITAMIN ADULTS PO), Restora, EPINEPHrine, UNABLE TO FIND, fluticasone, albuterol, fluocinonide cream, levocetirizine, fluticasone furoate-vilanterol, montelukast, azelastine, tadalafil, ezetimibe-simvastatin, and aliskiren.  Meds ordered this encounter  Medications   gabapentin (NEURONTIN) 100 MG capsule    Sig: Take 2 capsules (200 mg total) by mouth 3 (three) times daily.    Dispense:  540 capsule    Refill:  0   ezetimibe-simvastatin (VYTORIN) 10-20 MG tablet    Sig: TAKE 1 TABLET BY MOUTH EVERY NIGHT AT BEDTIME    Dispense:  90 tablet    Refill:  1   aliskiren (TEKTURNA) 150 MG tablet    Sig: TAKE 1 TABLET BY MOUTH ONCE DAILY    Dispense:  90 tablet    Refill:  1     Follow-up: Return in about 6 months (around 07/08/2022).  Scarlette Calico, MD

## 2022-01-12 ENCOUNTER — Other Ambulatory Visit (HOSPITAL_COMMUNITY): Payer: Self-pay

## 2022-01-12 MED ORDER — EZETIMIBE-SIMVASTATIN 10-20 MG PO TABS
1.0000 | ORAL_TABLET | Freq: Every day | ORAL | 1 refills | Status: DC
  Filled 2022-01-12 – 2022-02-22 (×3): qty 90, 90d supply, fill #0
  Filled 2022-06-02: qty 90, 90d supply, fill #1

## 2022-01-12 MED ORDER — ALISKIREN FUMARATE 150 MG PO TABS
150.0000 mg | ORAL_TABLET | Freq: Every day | ORAL | 1 refills | Status: DC
  Filled 2022-01-12: qty 90, 90d supply, fill #0
  Filled 2022-01-15: qty 30, 30d supply, fill #0
  Filled 2022-01-15: qty 60, 60d supply, fill #0
  Filled 2022-04-26: qty 90, 90d supply, fill #1

## 2022-01-12 NOTE — Assessment & Plan Note (Signed)
Exam completed Labs reviewed Vaccines reviewed and updated Cancer screenings are up-to-date Patient education was given 

## 2022-01-13 ENCOUNTER — Other Ambulatory Visit (HOSPITAL_COMMUNITY): Payer: Self-pay

## 2022-01-14 ENCOUNTER — Other Ambulatory Visit (HOSPITAL_COMMUNITY): Payer: Self-pay

## 2022-01-15 ENCOUNTER — Other Ambulatory Visit (HOSPITAL_COMMUNITY): Payer: Self-pay

## 2022-01-18 ENCOUNTER — Other Ambulatory Visit (HOSPITAL_COMMUNITY): Payer: Self-pay

## 2022-01-19 ENCOUNTER — Other Ambulatory Visit (HOSPITAL_COMMUNITY): Payer: Self-pay

## 2022-01-19 MED ORDER — LEVOCETIRIZINE DIHYDROCHLORIDE 5 MG PO TABS
5.0000 mg | ORAL_TABLET | Freq: Every evening | ORAL | 3 refills | Status: DC
  Filled 2022-01-19 – 2022-07-28 (×3): qty 30, 30d supply, fill #0

## 2022-01-19 MED ORDER — LEVOCETIRIZINE DIHYDROCHLORIDE 5 MG PO TABS
ORAL_TABLET | ORAL | 2 refills | Status: DC
  Filled 2022-01-19: qty 90, 90d supply, fill #0
  Filled 2022-04-26: qty 90, 90d supply, fill #1
  Filled 2022-07-28: qty 90, 90d supply, fill #2

## 2022-01-26 ENCOUNTER — Other Ambulatory Visit: Payer: Self-pay

## 2022-01-26 ENCOUNTER — Other Ambulatory Visit (HOSPITAL_COMMUNITY): Payer: Self-pay

## 2022-01-26 MED ORDER — FLUTICASONE FUROATE-VILANTEROL 200-25 MCG/ACT IN AEPB
1.0000 | INHALATION_SPRAY | Freq: Every day | RESPIRATORY_TRACT | 3 refills | Status: DC
  Filled 2022-01-26: qty 60, 30d supply, fill #0
  Filled 2022-02-22: qty 60, 30d supply, fill #1
  Filled 2022-03-23: qty 60, 30d supply, fill #2

## 2022-01-27 ENCOUNTER — Other Ambulatory Visit (HOSPITAL_COMMUNITY): Payer: Self-pay

## 2022-02-15 ENCOUNTER — Other Ambulatory Visit (HOSPITAL_COMMUNITY): Payer: Self-pay

## 2022-02-16 NOTE — Progress Notes (Unsigned)
Assessment/Plan:   1.  Tremor.  -Patient most certainly has evidence of essential tremor, as he did when I saw him back in 2017.  -Patient has just a little bit of rest tremor and some near movements, so we decided to go ahead and do a DaTscan.  He does not meet [place] brain bank criteria for the diagnosis of Parkinson's disease.  There is no rigidity and really no bradykinesia present.  -Most of his bothersome is from essential tremor and he may require medication for this.  We we will talk about this further at our next visit. 2.  Lower extremity paresthesias  -Patient describes this as peripheral neuropathy, and states that it started during [place].  I do not see a lot of evidence of peripheral neuropathy on exam, with the exception of mild decreased vibratory sensation.  -We will schedule EMG  -We will do lab work including B12, folate, RPR, SPEP/UPEP with immunofixation 3.  Follow-up after the above  Subjective:   Kenneth Sellers was seen in consultation in the movement disorder clinic at the request of Janith Lima, MD.  The evaluation is for tremor.  I saw the patient back in 2017 for the same.  At that time, his tremor was minimal and he did not require medication.  We discussed possibly trialing propranolol in the future with pcp if tremor got worse and if asthma under good control.  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  He reports that the bigger issue that gets him here is "neuropathy."  He did not have it until 11/2018.  He got covid and he woke up 1 day with burning sensation from the knees to the ankles.  It is now virtually gone in the L leg but still present in the R leg.  Its better than it was but still there.  He states that he feels that the legs are "rubbery" but doesn't feel that he will fall.  That sensation is bothersome to him.    He is now on gabapentin for tremor per  notes but its for neuropathy as well.  He is on 200 mg tid.  That was started 2 years ago.  He states that it helps tremor some and he often misses the middle of the day dosage.  He will sometimes just take 200 in the AM and 300 mg at bed.    The tremor itself has also gotten worse in the hands with time, esp if he uses plasticware.  Tremor started approximately 8 years ago and involves the R>L hand (or he just notes it more b/c he is is R handed).  Tremor is most noticeable when doing things but he can feels it at rest.   There is a family hx of tremor in his older brother.    Affected by caffeine:  No. Affected by alcohol:  unknown Affected by stress:  Yes.   Affected by fatigue:  Yes.   Spills soup if on spoon:  may/may not Spills glass of liquid if full:  No. Affects ADL's (tying shoes, brushing teeth, etc):  No. (Shaves with electric razor)  Current/Previously tried tremor medications: gabapentin  Current medications that may exacerbate tremor:  singulair (uses daily); albuterol (used a lot in summer/fall)    No Known Allergies  Current Meds  Medication Sig   albuterol (VENTOLIN HFA) 108 (90 Base) MCG/ACT inhaler  Use 1 - 2 puffs as needed every 4 -  6 hours as needed for cough/wheeze   aliskiren (TEKTURNA) 150 MG tablet Take 1 tablet (150 mg total) by mouth daily.   AMBULATORY NON FORMULARY MEDICATION CPAP machine nightly   azelastine (ASTELIN) 0.1 % nasal spray Place 2 sprays into both nostrils 2 (two) times daily. Use in each nostril as directed   EPINEPHrine 0.3 mg/0.3 mL IJ SOAJ injection Inject 1 pen as directed as needed for systemic reactions   ezetimibe-simvastatin (VYTORIN) 10-20 MG tablet Take 1 tablet by mouth at bedtime.   fluticasone (FLONASE) 50 MCG/ACT nasal spray Place 1 - 2 sprays in each nostril once a day   fluticasone furoate-vilanterol (BREO ELLIPTA) 200-25 MCG/ACT AEPB Inhale 1 puff into the lungs daily.   gabapentin (NEURONTIN) 100 MG capsule Take 2 capsules  (200 mg total) by mouth 3 (three) times daily.   levocetirizine (XYZAL) 5 MG tablet Take 1 tablet (5 mg total) by mouth daily.   levocetirizine (XYZAL) 5 MG tablet Take 1 tablet by mouth every evening   montelukast (SINGULAIR) 10 MG tablet Take 1 tablet (10 mg total) by mouth daily.   Multiple Vitamins-Minerals (MULTIVITAMIN ADULTS PO) Take 1 tablet by mouth daily.    Probiotic Product (RESTORA) CAPS TAKE 1 CAPSULE BY MOUTH DAILY.   tadalafil (CIALIS) 20 MG tablet Take 1 tablet (20 mg total) by mouth as needed.      Objective:   VITALS:   Vitals:   02/18/22 0939  BP: (!) 124/92  Pulse: 80  SpO2: 97%  Weight: 183 lb 3.2 oz (83.1 kg)  Height: '5\' 10"'$  (1.778 m)   Gen:  Appears stated age and in NAD. HEENT:  Normocephalic, atraumatic. The mucous membranes are moist. The superficial temporal arteries are without ropiness or tenderness. Cardiovascular: Regular rate and rhythm. Lungs: Clear to auscultation bilaterally. Neck: There are no carotid bruits noted bilaterally.  NEUROLOGICAL:  Orientation:  The patient is alert and oriented x 3.   Cranial nerves: There is good facial symmetry. Extraocular muscles are intact and visual fields are full to confrontational testing. Speech is fluent and clear. Soft palate rises symmetrically and there is no tongue deviation. Hearing is intact to conversational tone. Tone: Tone is good throughout.  No increase in tone Sensation: Sensation is intact to light touch touch throughout (facial, trunk, extremities). Vibration is intact at the bilateral big toe. There is no extinction with double simultaneous stimulation. There is no sensory dermatomal level identified. Coordination:  The patient has no dysdiadichokinesia or dysmetria.  Some mirror movements on the left and slight decremation with finger taps on the right Motor: Strength is 5/5 in the bilateral upper and lower extremities.  Shoulder shrug is equal bilaterally.  There is no pronator drift.   There are no fasciculations noted. DTR's: Deep tendon reflexes are 2-/4 at the bilateral biceps, triceps, brachioradialis, patella and 1/4 achilles.  Plantar responses are downgoing bilaterally. Gait and Station: The patient is able to ambulate without difficulty. The patient is able to heel toe walk without any difficulty. The patient has mild trouble ambulating in tandem fashion.    MOVEMENT EXAM: Tremor:  There is very rare rest tremor on the right, but it does not come with distraction procedures.  It comes spontaneously and only 1 time during the visit.  He has postural and intention tremor bilaterally, overall mild to perhaps moderate.      I have reviewed and interpreted the following labs independently  Chemistry      Component Value Date/Time   NA 139 01/07/2022 0952   K 4.1 01/07/2022 0952   CL 101 01/07/2022 0952   CO2 32 01/07/2022 0952   BUN 16 01/07/2022 0952   CREATININE 0.91 01/07/2022 0952      Component Value Date/Time   CALCIUM 9.4 01/07/2022 0952   ALKPHOS 53 01/07/2022 0952   AST 25 01/07/2022 0952   ALT 22 01/07/2022 0952   BILITOT 0.9 01/07/2022 0952      Lab Results  Component Value Date   WBC 5.8 01/07/2022   HGB 16.3 01/07/2022   HCT 48.5 01/07/2022   MCV 91.3 01/07/2022   PLT 192.0 01/07/2022   Lab Results  Component Value Date   TSH 3.39 01/07/2022   No results found for: "VITAMINB12"  Lab Results  Component Value Date   HGBA1C 5.8 01/07/2022      Total time spent on today's visit was 60 minutes, including both face-to-face time and nonface-to-face time.  Time included that spent on review of records (prior notes available to me/labs/imaging if pertinent), discussing treatment and goals, answering patient's questions and coordinating care.  CC:  Janith Lima, MD

## 2022-02-18 ENCOUNTER — Ambulatory Visit: Payer: Medicare PPO | Admitting: Neurology

## 2022-02-18 ENCOUNTER — Other Ambulatory Visit (INDEPENDENT_AMBULATORY_CARE_PROVIDER_SITE_OTHER): Payer: Medicare PPO

## 2022-02-18 ENCOUNTER — Encounter: Payer: Self-pay | Admitting: Neurology

## 2022-02-18 VITALS — BP 124/92 | HR 80 | Ht 70.0 in | Wt 183.2 lb

## 2022-02-18 DIAGNOSIS — R202 Paresthesia of skin: Secondary | ICD-10-CM | POA: Diagnosis not present

## 2022-02-18 DIAGNOSIS — R6889 Other general symptoms and signs: Secondary | ICD-10-CM | POA: Diagnosis not present

## 2022-02-18 DIAGNOSIS — Z5181 Encounter for therapeutic drug level monitoring: Secondary | ICD-10-CM

## 2022-02-18 DIAGNOSIS — R251 Tremor, unspecified: Secondary | ICD-10-CM | POA: Diagnosis not present

## 2022-02-18 DIAGNOSIS — G709 Myoneural disorder, unspecified: Secondary | ICD-10-CM | POA: Insufficient documentation

## 2022-02-18 DIAGNOSIS — E538 Deficiency of other specified B group vitamins: Secondary | ICD-10-CM

## 2022-02-18 LAB — B12 AND FOLATE PANEL
Folate: >23.8 ng/mL (ref >5.9)
Vitamin B-12: 566 pg/mL (ref 211–911)

## 2022-02-18 NOTE — Patient Instructions (Addendum)
Your provider has requested that you have labwork completed today. The lab is located on the Second floor at [place], within the [facility] office. When you get off the elevator, turn right and go in the [facility] [address]; the first brown door on the left.  Tell the ladies behind the desk that you are there for lab work. If you are not called within 15 minutes please check with the front desk.   Once you complete your labs you are free to go. You will receive a call or message via MyChart with your lab results.     As we discussed, we are going to do a DaT scan.  We discussed that this is not a diagnostic scan, but will just give [place] some information on dopamine levels in the brain.  Here is some information which may be helpful to you.  Before the Exam  Please tell the nurse, nuclear imaging technician or nuclear medicine physician if you are pregnant, nursing or have reduced liver function. Please also inform us if you have an allergy or sensitivity to iodine.  The test may be completed with those who are allergic to iodine, but may require pre-medication with other medications to help avoid reactions. If you need to cancel the examination, please give [place] at least 24 hours notice.  Before your scan, stop taking these medicines for the length of time shown: Name of Drug Stop Taking  Amoxapine 4 days before  Benztropine  Cogentin 3 days before  Bupropion (Aplenzin, Budeprion, Voxra, Wellbutrin, Zyban) 48 hours before  Buspirone 15 hours before  Citalopram 24 hours before  Cocaine 6 hours before  Escitalopram 24 hours before  Methamphetamine 24 hours before  Methylphenidate (Concerta, Metadate, Methylin, Ritalin) 20 hours before  Paroxetine 24 hours before  Selegilene 48 hours before  Sertraline 3 days before    On the Day of the Exam Drink plenty of fluids and go to the bathroom frequently (and for two days after your exam) Wear loose comfortable clothing,  since you will need to lie still for a period of time. Please bring a list of all medications that you are taking; name and dosage. We want to make your waiting time as pleasant as possible. Consider bringing your favorite magazine, book or music player to help you pass the time.  You do not need to stay at the imaging facility the entire time, between the initial injection and the scan itself.   Please leave your jewelry and valuables at home.  During the Exam The DaTscan once started takes approximately 30-45 minutes. However, following injection of the DaT agent approximately 3-6 hours are required before the agent has achieved appropriate concentration in the brain.  We will inject the DaTscan through an intravenous (IV) line into your arm in the AM, usually around 8-9am, and then you will come back usually in the mid afternoon for the scan. Before the exam, you will receive a drug to allow you to protect the thyroid. For the imaging test, you will be asked to lie on a table and an imaging technologist will position your head in a headrest. A strip of tape or a flexible restraint may be placed around your head to help you to not move your head during the scan. A camera will be positioned above you and you must remain very still for about 30 minute while images are taken. The scanner will be very close to your head, but will not touch  your head.

## 2022-02-22 LAB — PROTEIN ELECTROPHORESIS, URINE REFLEX
Albumin ELP, Urine: 100 %
Alpha-1-Globulin, U: 0 %
Alpha-2-Globulin, U: 0 %
Beta Globulin, U: 0 %
Gamma Globulin, U: 0 %
Protein, Ur: 4.6 mg/dL

## 2022-02-22 LAB — IMMUNOFIXATION, URINE

## 2022-02-23 ENCOUNTER — Other Ambulatory Visit: Payer: Self-pay

## 2022-02-23 ENCOUNTER — Other Ambulatory Visit (HOSPITAL_COMMUNITY): Payer: Self-pay

## 2022-02-24 ENCOUNTER — Other Ambulatory Visit (HOSPITAL_COMMUNITY): Payer: Self-pay

## 2022-02-25 LAB — PROTEIN ELECTROPHORESIS, SERUM
Albumin ELP: 4.3 g/dL (ref 3.8–4.8)
Alpha 1: 0.3 g/dL (ref 0.2–0.3)
Alpha 2: 0.9 g/dL (ref 0.5–0.9)
Beta 2: 0.4 g/dL (ref 0.2–0.5)
Beta Globulin: 0.4 g/dL (ref 0.4–0.6)
Gamma Globulin: 0.7 g/dL — ABNORMAL LOW (ref 0.8–1.7)
Total Protein: 6.9 g/dL (ref 6.1–8.1)

## 2022-02-25 LAB — RPR: RPR Ser Ql: NONREACTIVE

## 2022-02-25 LAB — IMMUNOFIXATION ELECTROPHORESIS
IgG (Immunoglobin G), Serum: 733 mg/dL (ref 600–1540)
IgM, Serum: 45 mg/dL — ABNORMAL LOW (ref 50–300)
Immunoglobulin A: 116 mg/dL (ref 70–320)

## 2022-03-23 ENCOUNTER — Ambulatory Visit (HOSPITAL_COMMUNITY)
Admission: RE | Admit: 2022-03-23 | Discharge: 2022-03-23 | Disposition: A | Discharge status: Home / Self Care | Payer: Medicare PPO | Source: Ambulatory Visit | Attending: Neurology | Admitting: Neurology

## 2022-03-23 DIAGNOSIS — R251 Tremor, unspecified: Secondary | ICD-10-CM | POA: Diagnosis not present

## 2022-03-23 MED ORDER — POTASSIUM IODIDE (ANTIDOTE) 130 MG PO TABS
ORAL_TABLET | ORAL | Status: AC
  Administered 2022-03-23: 130 mg via ORAL
  Filled 2022-03-23: qty 1

## 2022-03-23 MED ORDER — IOFLUPANE I 123 185 MBQ/2.5ML IV SOLN
4.6000 | Freq: Once | INTRAVENOUS | Status: AC | PRN
  Administered 2022-03-23: 4.6 via INTRAVENOUS
  Filled 2022-03-23: qty 5

## 2022-04-05 ENCOUNTER — Ambulatory Visit (INDEPENDENT_AMBULATORY_CARE_PROVIDER_SITE_OTHER): Payer: Medicare PPO | Admitting: Neurology

## 2022-04-05 DIAGNOSIS — R202 Paresthesia of skin: Secondary | ICD-10-CM | POA: Diagnosis not present

## 2022-04-05 NOTE — Procedures (Signed)
  [facility]  [address], [address]  [place], [place] [address] Tel: [phone] Fax: [phone] Test Date:  04/05/2022  Patient: Kenneth Sellers DOB: 03-06-1955 Physician: Kai Levins, MD  Sex: Male Height: 5' 10"$  Ref Phys: Alonza Bogus, DO  ID#: [id number]   Technician:    History: This is a 67 year old male with burning  and rubbery sensation in legs.  NCV & EMG Findings: Extensive electrodiagnostic evaluation of the right lower limb shows: Right sural and superficial peroneal sensory responses are within normal limits. Right peroneal/fibular (EDB) and tibial motor (AH) responses are within normal limits. Righ H reflex latency is mildly prolonged. There is no evidence of active or chronic motor axon loss changes affecting any of the tested muscles. Motor unit configuration and recruitment pattern is within normal limits.  Impression: This is a normal study of the right lower limb. In particular, there is no electrodiagnostic evidence of a right lumbosacral (L3-S1) radiculopathy, large fiber sensorimotor neuropathy, or myopathy.    ___________________________ Kai Levins, MD    Nerve Conduction Studies Motor Nerve Results    Latency Amplitude F-Lat Segment Distance CV Comment  Site (ms) Norm (mV) Norm (ms)  (cm) (m/s) Norm   Right Fibular (EDB) Motor  Ankle 4.6  < 6.0 6.4  > 2.5        Bel fib head 11.4 - 6.1 -  Bel fib head-Ankle 32 47  > 40   Pop fossa 13.6 - 5.4 -  Pop fossa-Bel fib head 9 41 -   Right Tibial (AH) Motor  Ankle 4.9  < 6.0 9.4  > 4.0        Knee 14.9 - 7.3 -  Knee-Ankle 43 43  > 40    Sensory Sites    Neg Peak Lat Amplitude (O-P) Segment Distance Velocity Comment  Site (ms) Norm (V) Norm  (cm) (ms)   Right Superficial Fibular Sensory  14 cm-Ankle 3.6  < 4.6 3  > 3 14 cm-Ankle 14    Right Sural Sensory  Calf-Lat mall 4.4  < 4.6 6  > 3 Calf-Lat mall 14     H-Reflex Results    M-Lat H Lat H-M Lat  Site (ms) (ms) Norm (ms)  Right  Tibial H-Reflex  Pop fossa 6.9 *38.3  < 35.0 31.4   Electromyography   Side Muscle Ins.Act Fibs Fasc Recrt Amp Dur Poly Activation Comment  Right Tib ant Nml Nml Nml Nml Nml Nml Nml Nml N/A  Right Gastroc MH Nml Nml Nml Nml Nml Nml Nml Nml N/A  Right FDL Nml Nml Nml Nml Nml Nml Nml Nml N/A  Right Rectus fem Nml Nml Nml Nml Nml Nml Nml Nml N/A  Right Biceps fem SH Nml Nml Nml Nml Nml Nml Nml Nml N/A  Right Gluteus med Nml Nml Nml Nml Nml Nml Nml Nml N/A      Waveforms:  Motor      Sensory      H-Reflex

## 2022-04-09 ENCOUNTER — Other Ambulatory Visit (HOSPITAL_COMMUNITY): Payer: Self-pay

## 2022-04-09 ENCOUNTER — Encounter: Payer: Self-pay | Admitting: Internal Medicine

## 2022-04-09 DIAGNOSIS — J3081 Allergic rhinitis due to animal (cat) (dog) hair and dander: Secondary | ICD-10-CM | POA: Diagnosis not present

## 2022-04-09 DIAGNOSIS — J4540 Moderate persistent asthma, uncomplicated: Secondary | ICD-10-CM | POA: Diagnosis not present

## 2022-04-09 DIAGNOSIS — J3089 Other allergic rhinitis: Secondary | ICD-10-CM | POA: Diagnosis not present

## 2022-04-09 DIAGNOSIS — J301 Allergic rhinitis due to pollen: Secondary | ICD-10-CM | POA: Diagnosis not present

## 2022-04-09 MED ORDER — TRELEGY ELLIPTA 200-62.5-25 MCG/ACT IN AEPB
1.0000 | INHALATION_SPRAY | Freq: Every day | RESPIRATORY_TRACT | 6 refills | Status: DC
  Filled 2022-04-09: qty 60, 30d supply, fill #0
  Filled 2022-05-20: qty 60, 30d supply, fill #1
  Filled 2022-06-15: qty 60, 30d supply, fill #2
  Filled 2022-07-15: qty 60, 30d supply, fill #3
  Filled 2022-08-19: qty 60, 30d supply, fill #4
  Filled 2022-09-20 (×2): qty 60, 30d supply, fill #5
  Filled 2022-10-22: qty 60, 30d supply, fill #6

## 2022-04-09 MED ORDER — EPINEPHRINE 0.3 MG/0.3ML IJ SOAJ
INTRAMUSCULAR | 1 refills | Status: DC
  Filled 2022-04-09: qty 2, 7d supply, fill #0

## 2022-04-12 ENCOUNTER — Other Ambulatory Visit: Payer: Self-pay | Admitting: Internal Medicine

## 2022-04-12 ENCOUNTER — Other Ambulatory Visit (HOSPITAL_COMMUNITY): Payer: Self-pay

## 2022-04-12 DIAGNOSIS — F321 Major depressive disorder, single episode, moderate: Secondary | ICD-10-CM | POA: Insufficient documentation

## 2022-04-12 MED ORDER — DULOXETINE HCL 30 MG PO CPEP
30.0000 mg | ORAL_CAPSULE | Freq: Every day | ORAL | 0 refills | Status: DC
  Filled 2022-04-12: qty 30, 30d supply, fill #0

## 2022-04-15 DIAGNOSIS — J3081 Allergic rhinitis due to animal (cat) (dog) hair and dander: Secondary | ICD-10-CM | POA: Diagnosis not present

## 2022-04-15 DIAGNOSIS — J301 Allergic rhinitis due to pollen: Secondary | ICD-10-CM | POA: Diagnosis not present

## 2022-04-15 DIAGNOSIS — J3089 Other allergic rhinitis: Secondary | ICD-10-CM | POA: Diagnosis not present

## 2022-04-23 DIAGNOSIS — J301 Allergic rhinitis due to pollen: Secondary | ICD-10-CM | POA: Diagnosis not present

## 2022-04-23 DIAGNOSIS — J3089 Other allergic rhinitis: Secondary | ICD-10-CM | POA: Diagnosis not present

## 2022-04-23 DIAGNOSIS — J3081 Allergic rhinitis due to animal (cat) (dog) hair and dander: Secondary | ICD-10-CM | POA: Diagnosis not present

## 2022-04-27 ENCOUNTER — Other Ambulatory Visit: Payer: Self-pay

## 2022-04-27 ENCOUNTER — Other Ambulatory Visit (HOSPITAL_COMMUNITY): Payer: Self-pay

## 2022-04-27 DIAGNOSIS — J301 Allergic rhinitis due to pollen: Secondary | ICD-10-CM | POA: Diagnosis not present

## 2022-04-27 DIAGNOSIS — J3081 Allergic rhinitis due to animal (cat) (dog) hair and dander: Secondary | ICD-10-CM | POA: Diagnosis not present

## 2022-04-27 DIAGNOSIS — J3089 Other allergic rhinitis: Secondary | ICD-10-CM | POA: Diagnosis not present

## 2022-04-28 ENCOUNTER — Other Ambulatory Visit (HOSPITAL_COMMUNITY): Payer: Self-pay

## 2022-04-29 DIAGNOSIS — J301 Allergic rhinitis due to pollen: Secondary | ICD-10-CM | POA: Diagnosis not present

## 2022-04-29 DIAGNOSIS — J3089 Other allergic rhinitis: Secondary | ICD-10-CM | POA: Diagnosis not present

## 2022-04-29 DIAGNOSIS — J3081 Allergic rhinitis due to animal (cat) (dog) hair and dander: Secondary | ICD-10-CM | POA: Diagnosis not present

## 2022-04-29 NOTE — Progress Notes (Signed)
Assessment/Plan:    1.  Essential Tremor  -DaTscan March 23, 2022 considered to be normal.  -discussed primidone  -he asked about focused ultrasound.  Discussed nature of it but don't recommend it for him and he agreed  2.  Lower extremity paresthesias  -No evidence clinically or on EMG of peripheral neuropathy.  EMG done by Dr. Berdine Addison April 05, 2022 was normal.  -Told the patient that he was going to need to follow back up with primary care.  This does not appear to be neurologically related.  While small fiber neuropathy can be normal on EMG, it would still give physical exam signs.  Discussed with him that other things that are out of our realm can cause similar symptoms, but we were not able to find neurologic cause for paresthesia below the knee.  -Patient has recently started Cymbalta for anxiety and I told him that potentially this could help this symptom.  3.  Patient will follow-up as needed.  He is certainly to follow-up if tremor gets worse or if he has new neurologic signs or symptoms.  Subjective:   Kenneth Sellers was seen today in follow up for tremor and testing results.  My previous records were reviewed prior to todays visit.  Patient with DaTscan March 23, 2022 reported to be normal.  He had an EMG with Dr. Berdine Addison on April 05, 2022.  Patient was complaining about burning paresthesias, but I did not see evidence of a lot of neuropathy on his examination.  Consistent with my examination, there was no evidence of a peripheral neuropathy.  It was a normal study.  Labs were done last visit.  His B12 was 566.  Folate was normal.   ALLERGIES:  No Known Allergies  CURRENT MEDICATIONS:  Current Meds  Medication Sig   albuterol (VENTOLIN HFA) 108 (90 Base) MCG/ACT inhaler Use 1 - 2 puffs as needed every 4 -  6 hours as needed for cough/wheeze   aliskiren (TEKTURNA) 150 MG tablet Take 1 tablet (150 mg total) by mouth daily.   AMBULATORY NON FORMULARY MEDICATION CPAP  machine nightly   azelastine (ASTELIN) 0.1 % nasal spray Place 2 sprays into both nostrils 2 (two) times daily. Use in each nostril as directed   DULoxetine (CYMBALTA) 60 MG capsule Take 1 capsule (60 mg total) by mouth daily. (Patient taking differently: Take 30 mg by mouth daily.)   EPINEPHrine 0.3 mg/0.3 mL IJ SOAJ injection Inject 1 pen as directed as needed for systemic reactions   EPINEPHrine 0.3 mg/0.3 mL IJ SOAJ injection Inject as directed as needed      Objective:    PHYSICAL EXAMINATION:    VITALS:   Vitals:   04/30/22 1300  BP: 118/72  Pulse: 72  SpO2: 96%  Weight: 181 lb 9.6 oz (82.4 kg)  Height: 5' 9.5" (1.765 m)    Gen:  Appears stated age and in NAD. HEENT:  Normocephalic, atraumatic. The mucous membranes are moist. The superficial temporal arteries are without ropiness or tenderness. Cardiovascular: Regular rate and rhythm. Lungs: Clear to auscultation bilaterally. Neck: There are no carotid bruits noted bilaterally.   NEUROLOGICAL:   Orientation:  The patient is alert and oriented x 3.   Cranial nerves: There is good facial symmetry. Extraocular muscles are intact and visual fields are full to confrontational testing. Speech is fluent and clear. Soft palate rises symmetrically and there is no tongue deviation. Hearing is intact to conversational tone. Tone: Tone is good throughout.  No increase in tone Sensation: Sensation is intact to light touch touch throughout. Coordination:  The patient has no dysdiadichokinesia or dysmetria.  Some mirror movements on the left and slight decremation with finger taps on the right Motor: Strength is at least antigravity x 4. Gait and Station: The patient is able to ambulate without difficulty.    MOVEMENT EXAM: Tremor:  There is very rare rest tremor on the right, but it does not increase with distraction procedures.  This is the same as last visit.  He has postural and intention tremor bilaterally, overall mild to  perhaps moderate.   I have reviewed and interpreted the following labs independently   Chemistry      Component Value Date/Time   NA 139 01/07/2022 0952   K 4.1 01/07/2022 0952   CL 101 01/07/2022 0952   CO2 32 01/07/2022 0952   BUN 16 01/07/2022 0952   CREATININE 0.91 01/07/2022 0952      Component Value Date/Time   CALCIUM 9.4 01/07/2022 0952   ALKPHOS 53 01/07/2022 0952   AST 25 01/07/2022 0952   ALT 22 01/07/2022 0952   BILITOT 0.9 01/07/2022 0952      Lab Results  Component Value Date   WBC 5.8 01/07/2022   HGB 16.3 01/07/2022   HCT 48.5 01/07/2022   MCV 91.3 01/07/2022   PLT 192.0 01/07/2022   Lab Results  Component Value Date   TSH 3.39 01/07/2022     Chemistry      Component Value Date/Time   NA 139 01/07/2022 0952   K 4.1 01/07/2022 0952   CL 101 01/07/2022 0952   CO2 32 01/07/2022 0952   BUN 16 01/07/2022 0952   CREATININE 0.91 01/07/2022 0952      Component Value Date/Time   CALCIUM 9.4 01/07/2022 0952   ALKPHOS 53 01/07/2022 0952   AST 25 01/07/2022 0952   ALT 22 01/07/2022 0952   BILITOT 0.9 01/07/2022 0952       Cc:  Janith Lima, MD

## 2022-04-30 ENCOUNTER — Other Ambulatory Visit: Payer: Self-pay | Admitting: Internal Medicine

## 2022-04-30 ENCOUNTER — Ambulatory Visit: Payer: Medicare PPO | Admitting: Neurology

## 2022-04-30 ENCOUNTER — Other Ambulatory Visit (HOSPITAL_COMMUNITY): Payer: Self-pay

## 2022-04-30 ENCOUNTER — Encounter: Payer: Self-pay | Admitting: Neurology

## 2022-04-30 VITALS — BP 118/72 | HR 72 | Ht 69.5 in | Wt 181.6 lb

## 2022-04-30 DIAGNOSIS — F321 Major depressive disorder, single episode, moderate: Secondary | ICD-10-CM

## 2022-04-30 DIAGNOSIS — R202 Paresthesia of skin: Secondary | ICD-10-CM | POA: Diagnosis not present

## 2022-04-30 DIAGNOSIS — G250 Essential tremor: Secondary | ICD-10-CM | POA: Diagnosis not present

## 2022-04-30 MED ORDER — DULOXETINE HCL 60 MG PO CPEP
60.0000 mg | ORAL_CAPSULE | Freq: Every day | ORAL | 0 refills | Status: DC
  Filled 2022-04-30: qty 90, 90d supply, fill #0

## 2022-05-10 ENCOUNTER — Other Ambulatory Visit (HOSPITAL_COMMUNITY): Payer: Self-pay

## 2022-05-11 DIAGNOSIS — J3081 Allergic rhinitis due to animal (cat) (dog) hair and dander: Secondary | ICD-10-CM | POA: Diagnosis not present

## 2022-05-11 DIAGNOSIS — J301 Allergic rhinitis due to pollen: Secondary | ICD-10-CM | POA: Diagnosis not present

## 2022-05-11 DIAGNOSIS — J3089 Other allergic rhinitis: Secondary | ICD-10-CM | POA: Diagnosis not present

## 2022-05-12 DIAGNOSIS — G4733 Obstructive sleep apnea (adult) (pediatric): Secondary | ICD-10-CM | POA: Diagnosis not present

## 2022-05-13 DIAGNOSIS — J3089 Other allergic rhinitis: Secondary | ICD-10-CM | POA: Diagnosis not present

## 2022-05-13 DIAGNOSIS — J301 Allergic rhinitis due to pollen: Secondary | ICD-10-CM | POA: Diagnosis not present

## 2022-05-13 DIAGNOSIS — J3081 Allergic rhinitis due to animal (cat) (dog) hair and dander: Secondary | ICD-10-CM | POA: Diagnosis not present

## 2022-05-19 DIAGNOSIS — J3089 Other allergic rhinitis: Secondary | ICD-10-CM | POA: Diagnosis not present

## 2022-05-19 DIAGNOSIS — J3081 Allergic rhinitis due to animal (cat) (dog) hair and dander: Secondary | ICD-10-CM | POA: Diagnosis not present

## 2022-05-19 DIAGNOSIS — J301 Allergic rhinitis due to pollen: Secondary | ICD-10-CM | POA: Diagnosis not present

## 2022-05-20 ENCOUNTER — Other Ambulatory Visit: Payer: Self-pay | Admitting: Internal Medicine

## 2022-05-20 ENCOUNTER — Other Ambulatory Visit (HOSPITAL_COMMUNITY): Payer: Self-pay

## 2022-05-20 DIAGNOSIS — G250 Essential tremor: Secondary | ICD-10-CM

## 2022-05-20 DIAGNOSIS — L280 Lichen simplex chronicus: Secondary | ICD-10-CM

## 2022-05-20 MED ORDER — GABAPENTIN 100 MG PO CAPS
200.0000 mg | ORAL_CAPSULE | Freq: Three times a day (TID) | ORAL | 0 refills | Status: DC
  Filled 2022-05-20: qty 540, 90d supply, fill #0

## 2022-05-21 DIAGNOSIS — J301 Allergic rhinitis due to pollen: Secondary | ICD-10-CM | POA: Diagnosis not present

## 2022-05-21 DIAGNOSIS — J3081 Allergic rhinitis due to animal (cat) (dog) hair and dander: Secondary | ICD-10-CM | POA: Diagnosis not present

## 2022-05-21 DIAGNOSIS — J3089 Other allergic rhinitis: Secondary | ICD-10-CM | POA: Diagnosis not present

## 2022-05-25 DIAGNOSIS — J3089 Other allergic rhinitis: Secondary | ICD-10-CM | POA: Diagnosis not present

## 2022-05-25 DIAGNOSIS — J301 Allergic rhinitis due to pollen: Secondary | ICD-10-CM | POA: Diagnosis not present

## 2022-05-25 DIAGNOSIS — J3081 Allergic rhinitis due to animal (cat) (dog) hair and dander: Secondary | ICD-10-CM | POA: Diagnosis not present

## 2022-05-28 DIAGNOSIS — J3081 Allergic rhinitis due to animal (cat) (dog) hair and dander: Secondary | ICD-10-CM | POA: Diagnosis not present

## 2022-05-28 DIAGNOSIS — J3089 Other allergic rhinitis: Secondary | ICD-10-CM | POA: Diagnosis not present

## 2022-05-28 DIAGNOSIS — J301 Allergic rhinitis due to pollen: Secondary | ICD-10-CM | POA: Diagnosis not present

## 2022-06-01 DIAGNOSIS — J3081 Allergic rhinitis due to animal (cat) (dog) hair and dander: Secondary | ICD-10-CM | POA: Diagnosis not present

## 2022-06-01 DIAGNOSIS — J3089 Other allergic rhinitis: Secondary | ICD-10-CM | POA: Diagnosis not present

## 2022-06-01 DIAGNOSIS — J301 Allergic rhinitis due to pollen: Secondary | ICD-10-CM | POA: Diagnosis not present

## 2022-06-03 ENCOUNTER — Other Ambulatory Visit: Payer: Self-pay

## 2022-06-04 DIAGNOSIS — J3089 Other allergic rhinitis: Secondary | ICD-10-CM | POA: Diagnosis not present

## 2022-06-04 DIAGNOSIS — J3081 Allergic rhinitis due to animal (cat) (dog) hair and dander: Secondary | ICD-10-CM | POA: Diagnosis not present

## 2022-06-04 DIAGNOSIS — J301 Allergic rhinitis due to pollen: Secondary | ICD-10-CM | POA: Diagnosis not present

## 2022-06-09 DIAGNOSIS — J301 Allergic rhinitis due to pollen: Secondary | ICD-10-CM | POA: Diagnosis not present

## 2022-06-09 DIAGNOSIS — J3089 Other allergic rhinitis: Secondary | ICD-10-CM | POA: Diagnosis not present

## 2022-06-09 DIAGNOSIS — J3081 Allergic rhinitis due to animal (cat) (dog) hair and dander: Secondary | ICD-10-CM | POA: Diagnosis not present

## 2022-06-14 DIAGNOSIS — J3089 Other allergic rhinitis: Secondary | ICD-10-CM | POA: Diagnosis not present

## 2022-06-14 DIAGNOSIS — J3081 Allergic rhinitis due to animal (cat) (dog) hair and dander: Secondary | ICD-10-CM | POA: Diagnosis not present

## 2022-06-14 DIAGNOSIS — J301 Allergic rhinitis due to pollen: Secondary | ICD-10-CM | POA: Diagnosis not present

## 2022-06-21 DIAGNOSIS — J3089 Other allergic rhinitis: Secondary | ICD-10-CM | POA: Diagnosis not present

## 2022-06-21 DIAGNOSIS — J301 Allergic rhinitis due to pollen: Secondary | ICD-10-CM | POA: Diagnosis not present

## 2022-06-21 DIAGNOSIS — J3081 Allergic rhinitis due to animal (cat) (dog) hair and dander: Secondary | ICD-10-CM | POA: Diagnosis not present

## 2022-06-28 DIAGNOSIS — J3089 Other allergic rhinitis: Secondary | ICD-10-CM | POA: Diagnosis not present

## 2022-06-28 DIAGNOSIS — J3081 Allergic rhinitis due to animal (cat) (dog) hair and dander: Secondary | ICD-10-CM | POA: Diagnosis not present

## 2022-06-28 DIAGNOSIS — J301 Allergic rhinitis due to pollen: Secondary | ICD-10-CM | POA: Diagnosis not present

## 2022-07-01 DIAGNOSIS — H2512 Age-related nuclear cataract, left eye: Secondary | ICD-10-CM | POA: Diagnosis not present

## 2022-07-01 DIAGNOSIS — D3131 Benign neoplasm of right choroid: Secondary | ICD-10-CM | POA: Diagnosis not present

## 2022-07-06 DIAGNOSIS — J301 Allergic rhinitis due to pollen: Secondary | ICD-10-CM | POA: Diagnosis not present

## 2022-07-06 DIAGNOSIS — J3081 Allergic rhinitis due to animal (cat) (dog) hair and dander: Secondary | ICD-10-CM | POA: Diagnosis not present

## 2022-07-06 DIAGNOSIS — J3089 Other allergic rhinitis: Secondary | ICD-10-CM | POA: Diagnosis not present

## 2022-07-08 ENCOUNTER — Other Ambulatory Visit (HOSPITAL_COMMUNITY): Payer: Self-pay

## 2022-07-08 ENCOUNTER — Encounter: Payer: Self-pay | Admitting: Internal Medicine

## 2022-07-08 ENCOUNTER — Ambulatory Visit: Payer: Medicare PPO | Admitting: Internal Medicine

## 2022-07-08 ENCOUNTER — Other Ambulatory Visit: Payer: Self-pay

## 2022-07-08 VITALS — BP 134/82 | HR 80 | Temp 97.8°F | Resp 16 | Ht 69.5 in | Wt 188.0 lb

## 2022-07-08 DIAGNOSIS — E785 Hyperlipidemia, unspecified: Secondary | ICD-10-CM

## 2022-07-08 DIAGNOSIS — F321 Major depressive disorder, single episode, moderate: Secondary | ICD-10-CM | POA: Diagnosis not present

## 2022-07-08 DIAGNOSIS — I10 Essential (primary) hypertension: Secondary | ICD-10-CM

## 2022-07-08 DIAGNOSIS — G609 Hereditary and idiopathic neuropathy, unspecified: Secondary | ICD-10-CM

## 2022-07-08 LAB — BASIC METABOLIC PANEL
BUN: 17 mg/dL (ref 6–23)
CO2: 29 mEq/L (ref 19–32)
Calcium: 9.1 mg/dL (ref 8.4–10.5)
Chloride: 100 mEq/L (ref 96–112)
Creatinine, Ser: 1 mg/dL (ref 0.40–1.50)
GFR: 77.2 mL/min (ref >60.00)
Glucose, Bld: 98 mg/dL (ref 70–99)
Potassium: 4 mEq/L (ref 3.5–5.1)
Sodium: 138 mEq/L (ref 135–145)

## 2022-07-08 LAB — CBC WITH DIFFERENTIAL/PLATELET
Basophils Absolute: 0.1 10*3/uL (ref 0.0–0.1)
Basophils Relative: 1.3 % (ref 0.0–3.0)
Eosinophils Absolute: 0.4 10*3/uL (ref 0.0–0.7)
Eosinophils Relative: 7.5 % — ABNORMAL HIGH (ref 0.0–5.0)
HCT: 45.1 % (ref 39.0–52.0)
Hemoglobin: 15.1 g/dL (ref 13.0–17.0)
Lymphocytes Relative: 19.9 % (ref 12.0–46.0)
Lymphs Abs: 1.1 10*3/uL (ref 0.7–4.0)
MCHC: 33.4 g/dL (ref 30.0–36.0)
MCV: 91.9 fl (ref 78.0–100.0)
Monocytes Absolute: 0.9 10*3/uL (ref 0.1–1.0)
Monocytes Relative: 15.5 % — ABNORMAL HIGH (ref 3.0–12.0)
Neutro Abs: 3.2 10*3/uL (ref 1.4–7.7)
Neutrophils Relative %: 55.8 % (ref 43.0–77.0)
Platelets: 197 10*3/uL (ref 150.0–400.0)
RBC: 4.91 Mil/uL (ref 4.22–5.81)
RDW: 13.5 % (ref 11.5–15.5)
WBC: 5.7 10*3/uL (ref 4.0–10.5)

## 2022-07-08 MED ORDER — EZETIMIBE-SIMVASTATIN 10-20 MG PO TABS
1.0000 | ORAL_TABLET | Freq: Every day | ORAL | 1 refills | Status: DC
  Filled 2022-07-08 – 2022-09-01 (×2): qty 90, 90d supply, fill #0
  Filled 2022-12-13: qty 90, 90d supply, fill #1

## 2022-07-08 MED ORDER — DULOXETINE HCL 60 MG PO CPEP
60.0000 mg | ORAL_CAPSULE | Freq: Every day | ORAL | 1 refills | Status: DC
  Filled 2022-07-08 – 2022-07-26 (×2): qty 90, 90d supply, fill #0
  Filled 2022-10-13: qty 90, 90d supply, fill #1

## 2022-07-08 MED ORDER — ALISKIREN FUMARATE 150 MG PO TABS
150.0000 mg | ORAL_TABLET | Freq: Every day | ORAL | 1 refills | Status: DC
  Filled 2022-07-08 – 2022-07-26 (×2): qty 90, 90d supply, fill #0
  Filled 2022-10-22: qty 90, 90d supply, fill #1

## 2022-07-08 NOTE — Progress Notes (Signed)
Subjective:  Patient ID: Kenneth Sellers, male    DOB: 08-09-1955  Age: 67 y.o. MRN: [id number]  CC: Hypertension and Hyperlipidemia   HPI Kenneth Sellers presents for f/up ---  He is active and denies chest pain, shortness of breath, diaphoresis, or edema.  Outpatient Medications Prior to Visit  Medication Sig Dispense Refill   albuterol (VENTOLIN HFA) 108 (90 Base) MCG/ACT inhaler Use 1 - 2 puffs as needed every 4 -  6 hours as needed for cough/wheeze 18 g 0   AMBULATORY NON FORMULARY MEDICATION CPAP machine nightly     azelastine (ASTELIN) 0.1 % nasal spray Place 2 sprays into both nostrils 2 (two) times daily. Use in each nostril as directed 30 mL 5   EPINEPHrine 0.3 mg/0.3 mL IJ SOAJ injection Inject 1 pen as directed as needed for systemic reactions 1 each 1   EPINEPHrine 0.3 mg/0.3 mL IJ SOAJ injection Inject as directed as needed 60 each 1   fluticasone (FLONASE) 50 MCG/ACT nasal spray Place 1 - 2 sprays in each nostril once a day 48 g 1   fluticasone furoate-vilanterol (BREO ELLIPTA) 200-25 MCG/ACT AEPB Inhale 1 puff into the lungs daily. 60 each 3   Fluticasone-Umeclidin-Vilant (TRELEGY ELLIPTA) 200-62.5-25 MCG/ACT AEPB Inhale 1 Inhalation into the lungs daily. 60 each 6   gabapentin (NEURONTIN) 100 MG capsule Take 2 capsules (200 mg total) by mouth 3 (three) times daily. 540 capsule 0   levocetirizine (XYZAL) 5 MG tablet Take 1 tablet (5 mg total) by mouth daily. 90 tablet 2   levocetirizine (XYZAL) 5 MG tablet Take 1 tablet by mouth every evening 30 tablet 3   montelukast (SINGULAIR) 10 MG tablet Take 1 tablet (10 mg total) by mouth daily. 90 tablet 2   Multiple Vitamins-Minerals (MULTIVITAMIN ADULTS PO) Take 1 tablet by mouth daily.      Probiotic Product (RESTORA) CAPS TAKE 1 CAPSULE BY MOUTH DAILY. 90 capsule 1   tadalafil (CIALIS) 20 MG tablet Take 1 tablet (20 mg total) by mouth as needed. 20 tablet 11   aliskiren (TEKTURNA) 150 MG tablet Take 1 tablet (150 mg total) by mouth  daily. 90 tablet 1   DULoxetine (CYMBALTA) 60 MG capsule Take 1 capsule (60 mg total) by mouth daily. (Patient taking differently: Take 30 mg by mouth daily.) 90 capsule 0   ezetimibe-simvastatin (VYTORIN) 10-20 MG tablet Take 1 tablet by mouth at bedtime. 90 tablet 1   No facility-administered medications prior to visit.    ROS Review of Systems  Constitutional: Negative.  Negative for diaphoresis, fatigue and unexpected weight change.  HENT: Negative.    Eyes: Negative.   Respiratory:  Negative for chest tightness, shortness of breath and wheezing.   Cardiovascular:  Negative for chest pain, palpitations and leg swelling.  Gastrointestinal:  Negative for abdominal pain, constipation, diarrhea, nausea and vomiting.  Endocrine: Negative.   Genitourinary: Negative.  Negative for difficulty urinating and dysuria.  Musculoskeletal: Negative.   Skin: Negative.  Negative for color change and rash.  Neurological:  Positive for tremors and numbness. Negative for dizziness, weakness and light-headedness.  Hematological:  Negative for adenopathy. Does not bruise/bleed easily.  Psychiatric/Behavioral: Negative.      Objective:  BP 134/82 (BP Location: Left Arm, Patient Position: Sitting, Cuff Size: Large)   Pulse 80   Temp 97.8 F (36.6 C) (Oral)   Resp 16   Ht 5' 9.5" (1.765 m)   Wt 188 lb (85.3 kg)   SpO2 96%  BMI 27.36 kg/m   BP Readings from Last 3 Encounters:  07/08/22 134/82  04/30/22 118/72  02/18/22 (!) 124/92    Wt Readings from Last 3 Encounters:  07/08/22 188 lb (85.3 kg)  04/30/22 181 lb 9.6 oz (82.4 kg)  02/18/22 183 lb 3.2 oz (83.1 kg)    Physical Exam Vitals reviewed.  HENT:     Nose: Nose normal.     Mouth/Throat:     Mouth: Mucous membranes are moist.  Eyes:     General: No scleral icterus.    Conjunctiva/sclera: Conjunctivae normal.  Cardiovascular:     Rate and Rhythm: Normal rate and regular rhythm.     Pulses:          Carotid pulses are 1+ on  the right side and 1+ on the left side.      Radial pulses are 1+ on the right side and 1+ on the left side.       Femoral pulses are 1+ on the right side and 1+ on the left side.      Popliteal pulses are 1+ on the right side and 1+ on the left side.       Dorsalis pedis pulses are 1+ on the right side and 1+ on the left side.       Posterior tibial pulses are 1+ on the right side and 1+ on the left side.     Heart sounds: No murmur heard. Pulmonary:     Effort: Pulmonary effort is normal.     Breath sounds: No stridor. No wheezing, rhonchi or rales.  Abdominal:     General: Abdomen is flat.     Palpations: There is no mass.     Tenderness: There is no abdominal tenderness. There is no guarding.     Hernia: No hernia is present.  Musculoskeletal:        General: Normal range of motion.     Cervical back: Neck supple.     Right lower leg: No edema.     Left lower leg: No edema.  Lymphadenopathy:     Cervical: No cervical adenopathy.  Skin:    General: Skin is warm and dry.     Coloration: Skin is not pale.  Neurological:     General: No focal deficit present.     Mental Status: He is alert. Mental status is at baseline.  Psychiatric:        Mood and Affect: Mood normal.        Behavior: Behavior normal.     Lab Results  Component Value Date   WBC 5.7 07/08/2022   HGB 15.1 07/08/2022   HCT 45.1 07/08/2022   PLT 197.0 07/08/2022   GLUCOSE 98 07/08/2022   CHOL 160 01/07/2022   TRIG 82.0 01/07/2022   HDL 64.30 01/07/2022   LDLCALC 79 01/07/2022   ALT 22 01/07/2022   AST 25 01/07/2022   NA 138 07/08/2022   K 4.0 07/08/2022   CL 100 07/08/2022   CREATININE 1.00 07/08/2022   BUN 17 07/08/2022   CO2 29 07/08/2022   TSH 3.39 01/07/2022   PSA 0.65 01/07/2022   HGBA1C 5.8 01/07/2022    NM BRAIN DATSCAN TUMOR LOC INFLAM SPECT 1 DAY  Result Date: 03/23/2022 CLINICAL DATA:  67 year old male with longstanding tremor. Increasing intensity. EXAM: NUCLEAR MEDICINE BRAIN  IMAGING WITH SPECT  (DaTscan ) TECHNIQUE: SPECT images of the brain were obtained after intravenous injection of radiopharmaceutical. 4 hour post injection imaging. Appropriate  positioning. 130 mg IO STAT given orally for thyroid blockade. RADIOPHARMACEUTICALS:  4.6 millicuries I 123 Ioflupane COMPARISON:  None Available. FINDINGS: Normal, shape of the LEFT and RIGHT striata. There is greater relative activity in the LEFT striatum compared to the RIGHT. No focal deficit identified. IMPRESSION: Mild asymmetry of activity within the striata. Findings within normal limits. Of note, DaTSCAN is not diagnostic of Parkinsonian syndromes, which remains a clinical diagnosis. DaTscan is an adjuvant test to aid in the clinical diagnosis of Parkinsonian syndromes. Electronically Signed   By: Genevive Bi M.D.   On: 03/23/2022 16:12    Assessment & Plan:   Idiopathic small fiber peripheral neuropathy- He has responded well to duloxetine and gabapentin.  Current moderate episode of major depressive disorder without prior episode (HCC) -     DULoxetine HCl; Take 1 capsule (60 mg total) by mouth daily.  Dispense: 90 capsule; Refill: 1  Hyperlipidemia with target LDL less than 130- LDL goal achieved. Doing well on the statin  -     Ezetimibe-Simvastatin; Take 1 tablet by mouth at bedtime.  Dispense: 90 tablet; Refill: 1  Primary hypertension- His blood pressure is adequately well-controlled. -     Aliskiren Fumarate; Take 1 tablet (150 mg total) by mouth daily.  Dispense: 90 tablet; Refill: 1 -     CBC with Differential/Platelet; Future -     Basic metabolic panel; Future     Follow-up: Return in about 6 months (around 01/08/2023).  Sanda Linger, MD

## 2022-07-08 NOTE — Patient Instructions (Signed)
Hypertension, Adult High blood pressure (hypertension) is when the force of blood pumping through the arteries is too strong. The arteries are the blood vessels that carry blood from the heart throughout the body. Hypertension forces the heart to work harder to pump blood and may cause arteries to become narrow or stiff. Untreated or uncontrolled hypertension can lead to a heart attack, heart failure, a stroke, kidney disease, and other problems. A blood pressure reading consists of a higher number over a lower number. Ideally, your blood pressure should be below 120/80. The first ("top") number is called the systolic pressure. It is a measure of the pressure in your arteries as your heart beats. The second ("bottom") number is called the diastolic pressure. It is a measure of the pressure in your arteries as the heart relaxes. What are the causes? The exact cause of this condition is not known. There are some conditions that result in high blood pressure. What increases the risk? Certain factors may make you more likely to develop high blood pressure. Some of these risk factors are under your control, including: Smoking. Not getting enough exercise or physical activity. Being overweight. Having too much fat, sugar, calories, or salt (sodium) in your diet. Drinking too much alcohol. Other risk factors include: Having a personal history of heart disease, diabetes, high cholesterol, or kidney disease. Stress. Having a family history of high blood pressure and high cholesterol. Having obstructive sleep apnea. Age. The risk increases with age. What are the signs or symptoms? High blood pressure may not cause symptoms. Very high blood pressure (hypertensive crisis) may cause: Headache. Fast or irregular heartbeats (palpitations). Shortness of breath. Nosebleed. Nausea and vomiting. Vision changes. Severe chest pain, dizziness, and seizures. How is this diagnosed? This condition is diagnosed by  measuring your blood pressure while you are seated, with your arm resting on a flat surface, your legs uncrossed, and your feet flat on the floor. The cuff of the blood pressure monitor will be placed directly against the skin of your upper arm at the level of your heart. Blood pressure should be measured at least twice using the same arm. Certain conditions can cause a difference in blood pressure between your right and left arms. If you have a high blood pressure reading during one visit or you have normal blood pressure with other risk factors, you may be asked to: Return on a different day to have your blood pressure checked again. Monitor your blood pressure at home for 1 week or longer. If you are diagnosed with hypertension, you may have other blood or imaging tests to help your health care provider understand your overall risk for other conditions. How is this treated? This condition is treated by making healthy lifestyle changes, such as eating healthy foods, exercising more, and reducing your alcohol intake. You may be referred for counseling on a healthy diet and physical activity. Your health care provider may prescribe medicine if lifestyle changes are not enough to get your blood pressure under control and if: Your systolic blood pressure is above 130. Your diastolic blood pressure is above 80. Your personal target blood pressure may vary depending on your medical conditions, your age, and other factors. Follow these instructions at home: Eating and drinking  Eat a diet that is high in fiber and potassium, and low in sodium, added sugar, and fat. An example of this eating plan is called the DASH diet. DASH stands for Dietary Approaches to Stop Hypertension. To eat this way: Eat   plenty of fresh fruits and vegetables. Try to fill one half of your plate at each meal with fruits and vegetables. Eat whole grains, such as whole-wheat pasta, brown rice, or whole-grain bread. Fill about one  fourth of your plate with whole grains. Eat or drink low-fat dairy products, such as skim milk or low-fat yogurt. Avoid fatty cuts of meat, processed or cured meats, and poultry with skin. Fill about one fourth of your plate with lean proteins, such as fish, chicken without skin, beans, eggs, or tofu. Avoid pre-made and processed foods. These tend to be higher in sodium, added sugar, and fat. Reduce your daily sodium intake. Many people with hypertension should eat less than 1,500 mg of sodium a day. Do not drink alcohol if: Your health care provider tells you not to drink. You are pregnant, may be pregnant, or are planning to become pregnant. If you drink alcohol: Limit how much you have to: 0-1 drink a day for women. 0-2 drinks a day for men. Know how much alcohol is in your drink. In the [place] one drink equals one 12 oz bottle of beer (355 mL), one 5 oz glass of wine (148 mL), or one 1 oz glass of hard liquor (44 mL). Lifestyle  Work with your health care provider to maintain a healthy body weight or to lose weight. Ask what an ideal weight is for you. Get at least 30 minutes of exercise that causes your heart to beat faster (aerobic exercise) most days of the week. Activities may include walking, swimming, or biking. Include exercise to strengthen your muscles (resistance exercise), such as Pilates or lifting weights, as part of your weekly exercise routine. Try to do these types of exercises for 30 minutes at least 3 days a week. Do not use any products that contain nicotine or tobacco. These products include cigarettes, chewing tobacco, and vaping devices, such as e-cigarettes. If you need help quitting, ask your health care provider. Monitor your blood pressure at home as told by your health care provider. Keep all follow-up visits. This is important. Medicines Take over-the-counter and prescription medicines only as told by your health care provider. Follow directions carefully. Blood  pressure medicines must be taken as prescribed. Do not skip doses of blood pressure medicine. Doing this puts you at risk for problems and can make the medicine less effective. Ask your health care provider about side effects or reactions to medicines that you should watch for. Contact a health care provider if you: Think you are having a reaction to a medicine you are taking. Have headaches that keep coming back (recurring). Feel dizzy. Have swelling in your ankles. Have trouble with your vision. Get help right away if you: Develop a severe headache or confusion. Have unusual weakness or numbness. Feel faint. Have severe pain in your chest or abdomen. Vomit repeatedly. Have trouble breathing. These symptoms may be an emergency. Get help right away. Call 911. Do not wait to see if the symptoms will go away. Do not drive yourself to the hospital. Summary Hypertension is when the force of blood pumping through your arteries is too strong. If this condition is not controlled, it may put you at risk for serious complications. Your personal target blood pressure may vary depending on your medical conditions, your age, and other factors. For most people, a normal blood pressure is less than 120/80. Hypertension is treated with lifestyle changes, medicines, or a combination of both. Lifestyle changes include losing weight, eating a healthy,   low-sodium diet, exercising more, and limiting alcohol. This information is not intended to replace advice given to you by your health care provider. Make sure you discuss any questions you have with your health care provider. Document Revised: 12/02/2020 Document Reviewed: 12/02/2020 Elsevier Patient Education  2024 [employer]  

## 2022-07-09 DIAGNOSIS — J3089 Other allergic rhinitis: Secondary | ICD-10-CM | POA: Diagnosis not present

## 2022-07-09 DIAGNOSIS — J301 Allergic rhinitis due to pollen: Secondary | ICD-10-CM | POA: Diagnosis not present

## 2022-07-09 DIAGNOSIS — J3081 Allergic rhinitis due to animal (cat) (dog) hair and dander: Secondary | ICD-10-CM | POA: Diagnosis not present

## 2022-07-09 DIAGNOSIS — G609 Hereditary and idiopathic neuropathy, unspecified: Secondary | ICD-10-CM | POA: Insufficient documentation

## 2022-07-14 DIAGNOSIS — J301 Allergic rhinitis due to pollen: Secondary | ICD-10-CM | POA: Diagnosis not present

## 2022-07-14 DIAGNOSIS — J3081 Allergic rhinitis due to animal (cat) (dog) hair and dander: Secondary | ICD-10-CM | POA: Diagnosis not present

## 2022-07-14 DIAGNOSIS — J3089 Other allergic rhinitis: Secondary | ICD-10-CM | POA: Diagnosis not present

## 2022-07-16 ENCOUNTER — Other Ambulatory Visit (HOSPITAL_COMMUNITY): Payer: Self-pay

## 2022-07-20 DIAGNOSIS — J301 Allergic rhinitis due to pollen: Secondary | ICD-10-CM | POA: Diagnosis not present

## 2022-07-20 DIAGNOSIS — J3081 Allergic rhinitis due to animal (cat) (dog) hair and dander: Secondary | ICD-10-CM | POA: Diagnosis not present

## 2022-07-20 DIAGNOSIS — J3089 Other allergic rhinitis: Secondary | ICD-10-CM | POA: Diagnosis not present

## 2022-07-26 ENCOUNTER — Other Ambulatory Visit (HOSPITAL_COMMUNITY): Payer: Self-pay

## 2022-07-26 MED ORDER — ALBUTEROL SULFATE HFA 108 (90 BASE) MCG/ACT IN AERS
1.0000 | INHALATION_SPRAY | RESPIRATORY_TRACT | 0 refills | Status: DC | PRN
  Filled 2022-07-26: qty 6.7, 17d supply, fill #0

## 2022-07-26 MED ORDER — MONTELUKAST SODIUM 10 MG PO TABS
10.0000 mg | ORAL_TABLET | Freq: Every day | ORAL | 3 refills | Status: DC
  Filled 2022-07-26: qty 90, 90d supply, fill #0
  Filled 2022-10-13: qty 90, 90d supply, fill #1
  Filled 2023-01-20: qty 90, 90d supply, fill #2
  Filled 2023-04-21 – 2023-04-22 (×2): qty 90, 90d supply, fill #3

## 2022-07-27 ENCOUNTER — Other Ambulatory Visit (HOSPITAL_COMMUNITY): Payer: Self-pay

## 2022-07-27 ENCOUNTER — Other Ambulatory Visit: Payer: Self-pay

## 2022-07-27 DIAGNOSIS — J301 Allergic rhinitis due to pollen: Secondary | ICD-10-CM | POA: Diagnosis not present

## 2022-07-27 DIAGNOSIS — J3081 Allergic rhinitis due to animal (cat) (dog) hair and dander: Secondary | ICD-10-CM | POA: Diagnosis not present

## 2022-07-27 DIAGNOSIS — J3089 Other allergic rhinitis: Secondary | ICD-10-CM | POA: Diagnosis not present

## 2022-07-28 ENCOUNTER — Other Ambulatory Visit (HOSPITAL_COMMUNITY): Payer: Self-pay

## 2022-08-10 DIAGNOSIS — J3081 Allergic rhinitis due to animal (cat) (dog) hair and dander: Secondary | ICD-10-CM | POA: Diagnosis not present

## 2022-08-10 DIAGNOSIS — J3089 Other allergic rhinitis: Secondary | ICD-10-CM | POA: Diagnosis not present

## 2022-08-10 DIAGNOSIS — J301 Allergic rhinitis due to pollen: Secondary | ICD-10-CM | POA: Diagnosis not present

## 2022-08-17 DIAGNOSIS — J3089 Other allergic rhinitis: Secondary | ICD-10-CM | POA: Diagnosis not present

## 2022-08-17 DIAGNOSIS — J3081 Allergic rhinitis due to animal (cat) (dog) hair and dander: Secondary | ICD-10-CM | POA: Diagnosis not present

## 2022-08-17 DIAGNOSIS — J301 Allergic rhinitis due to pollen: Secondary | ICD-10-CM | POA: Diagnosis not present

## 2022-08-19 ENCOUNTER — Other Ambulatory Visit (HOSPITAL_COMMUNITY): Payer: Self-pay

## 2022-08-24 DIAGNOSIS — J3081 Allergic rhinitis due to animal (cat) (dog) hair and dander: Secondary | ICD-10-CM | POA: Diagnosis not present

## 2022-08-24 DIAGNOSIS — J3089 Other allergic rhinitis: Secondary | ICD-10-CM | POA: Diagnosis not present

## 2022-08-24 DIAGNOSIS — J301 Allergic rhinitis due to pollen: Secondary | ICD-10-CM | POA: Diagnosis not present

## 2022-08-31 DIAGNOSIS — J3089 Other allergic rhinitis: Secondary | ICD-10-CM | POA: Diagnosis not present

## 2022-08-31 DIAGNOSIS — J3081 Allergic rhinitis due to animal (cat) (dog) hair and dander: Secondary | ICD-10-CM | POA: Diagnosis not present

## 2022-08-31 DIAGNOSIS — J301 Allergic rhinitis due to pollen: Secondary | ICD-10-CM | POA: Diagnosis not present

## 2022-09-02 ENCOUNTER — Other Ambulatory Visit (HOSPITAL_COMMUNITY): Payer: Self-pay

## 2022-09-07 DIAGNOSIS — J3089 Other allergic rhinitis: Secondary | ICD-10-CM | POA: Diagnosis not present

## 2022-09-07 DIAGNOSIS — J3081 Allergic rhinitis due to animal (cat) (dog) hair and dander: Secondary | ICD-10-CM | POA: Diagnosis not present

## 2022-09-07 DIAGNOSIS — J301 Allergic rhinitis due to pollen: Secondary | ICD-10-CM | POA: Diagnosis not present

## 2022-09-20 ENCOUNTER — Other Ambulatory Visit (HOSPITAL_COMMUNITY): Payer: Self-pay

## 2022-09-20 ENCOUNTER — Other Ambulatory Visit: Payer: Self-pay

## 2022-09-23 DIAGNOSIS — J301 Allergic rhinitis due to pollen: Secondary | ICD-10-CM | POA: Diagnosis not present

## 2022-09-23 DIAGNOSIS — J3089 Other allergic rhinitis: Secondary | ICD-10-CM | POA: Diagnosis not present

## 2022-09-23 DIAGNOSIS — J3081 Allergic rhinitis due to animal (cat) (dog) hair and dander: Secondary | ICD-10-CM | POA: Diagnosis not present

## 2022-10-13 ENCOUNTER — Other Ambulatory Visit: Payer: Self-pay | Admitting: Internal Medicine

## 2022-10-13 DIAGNOSIS — L280 Lichen simplex chronicus: Secondary | ICD-10-CM

## 2022-10-13 DIAGNOSIS — G250 Essential tremor: Secondary | ICD-10-CM

## 2022-10-14 ENCOUNTER — Other Ambulatory Visit (HOSPITAL_COMMUNITY): Payer: Self-pay

## 2022-10-14 MED ORDER — GABAPENTIN 100 MG PO CAPS
200.0000 mg | ORAL_CAPSULE | Freq: Three times a day (TID) | ORAL | 0 refills | Status: DC
Start: 2022-10-14 — End: 2023-01-20
  Filled 2022-10-14: qty 540, 90d supply, fill #0

## 2022-10-21 DIAGNOSIS — J3089 Other allergic rhinitis: Secondary | ICD-10-CM | POA: Diagnosis not present

## 2022-10-21 DIAGNOSIS — J3081 Allergic rhinitis due to animal (cat) (dog) hair and dander: Secondary | ICD-10-CM | POA: Diagnosis not present

## 2022-10-21 DIAGNOSIS — J301 Allergic rhinitis due to pollen: Secondary | ICD-10-CM | POA: Diagnosis not present

## 2022-10-22 ENCOUNTER — Other Ambulatory Visit: Payer: Self-pay

## 2022-10-22 ENCOUNTER — Other Ambulatory Visit (HOSPITAL_COMMUNITY): Payer: Self-pay

## 2022-10-22 MED ORDER — LEVOCETIRIZINE DIHYDROCHLORIDE 5 MG PO TABS
5.0000 mg | ORAL_TABLET | Freq: Every day | ORAL | 2 refills | Status: DC
  Filled 2022-10-22: qty 90, 90d supply, fill #0
  Filled 2023-01-20: qty 90, 90d supply, fill #1
  Filled 2023-04-21 – 2023-04-22 (×2): qty 90, 90d supply, fill #2

## 2022-10-26 ENCOUNTER — Other Ambulatory Visit (HOSPITAL_COMMUNITY): Payer: Self-pay

## 2022-10-26 DIAGNOSIS — J301 Allergic rhinitis due to pollen: Secondary | ICD-10-CM | POA: Diagnosis not present

## 2022-10-26 DIAGNOSIS — J3081 Allergic rhinitis due to animal (cat) (dog) hair and dander: Secondary | ICD-10-CM | POA: Diagnosis not present

## 2022-10-26 DIAGNOSIS — J3089 Other allergic rhinitis: Secondary | ICD-10-CM | POA: Diagnosis not present

## 2022-10-27 DIAGNOSIS — J301 Allergic rhinitis due to pollen: Secondary | ICD-10-CM | POA: Diagnosis not present

## 2022-10-27 DIAGNOSIS — J3089 Other allergic rhinitis: Secondary | ICD-10-CM | POA: Diagnosis not present

## 2022-10-27 DIAGNOSIS — J3081 Allergic rhinitis due to animal (cat) (dog) hair and dander: Secondary | ICD-10-CM | POA: Diagnosis not present

## 2022-11-08 DIAGNOSIS — J3089 Other allergic rhinitis: Secondary | ICD-10-CM | POA: Diagnosis not present

## 2022-11-08 DIAGNOSIS — J301 Allergic rhinitis due to pollen: Secondary | ICD-10-CM | POA: Diagnosis not present

## 2022-11-08 DIAGNOSIS — J3081 Allergic rhinitis due to animal (cat) (dog) hair and dander: Secondary | ICD-10-CM | POA: Diagnosis not present

## 2022-11-17 DIAGNOSIS — G4733 Obstructive sleep apnea (adult) (pediatric): Secondary | ICD-10-CM | POA: Diagnosis not present

## 2022-11-26 ENCOUNTER — Other Ambulatory Visit (HOSPITAL_COMMUNITY): Payer: Self-pay

## 2022-11-29 ENCOUNTER — Other Ambulatory Visit (HOSPITAL_COMMUNITY): Payer: Self-pay

## 2022-11-29 MED ORDER — TRELEGY ELLIPTA 200-62.5-25 MCG/ACT IN AEPB
1.0000 | INHALATION_SPRAY | Freq: Every day | RESPIRATORY_TRACT | 6 refills | Status: DC
  Filled 2022-11-29: qty 60, 30d supply, fill #0
  Filled 2022-12-24: qty 60, 30d supply, fill #1
  Filled 2023-01-28: qty 60, 30d supply, fill #2
  Filled 2023-02-23: qty 60, 30d supply, fill #3
  Filled 2023-03-28: qty 60, 30d supply, fill #4
  Filled 2023-04-25 – 2023-04-26 (×2): qty 60, 30d supply, fill #5
  Filled 2023-05-31 – 2023-06-01 (×2): qty 60, 30d supply, fill #6

## 2022-12-06 DIAGNOSIS — J3081 Allergic rhinitis due to animal (cat) (dog) hair and dander: Secondary | ICD-10-CM | POA: Diagnosis not present

## 2022-12-06 DIAGNOSIS — J3089 Other allergic rhinitis: Secondary | ICD-10-CM | POA: Diagnosis not present

## 2022-12-06 DIAGNOSIS — J301 Allergic rhinitis due to pollen: Secondary | ICD-10-CM | POA: Diagnosis not present

## 2022-12-14 ENCOUNTER — Other Ambulatory Visit: Payer: Self-pay

## 2022-12-14 ENCOUNTER — Other Ambulatory Visit (HOSPITAL_COMMUNITY): Payer: Self-pay

## 2022-12-15 ENCOUNTER — Other Ambulatory Visit (HOSPITAL_COMMUNITY): Payer: Self-pay

## 2022-12-15 ENCOUNTER — Other Ambulatory Visit: Payer: Self-pay

## 2022-12-15 DIAGNOSIS — J3089 Other allergic rhinitis: Secondary | ICD-10-CM | POA: Diagnosis not present

## 2022-12-15 DIAGNOSIS — J301 Allergic rhinitis due to pollen: Secondary | ICD-10-CM | POA: Diagnosis not present

## 2022-12-15 DIAGNOSIS — J3081 Allergic rhinitis due to animal (cat) (dog) hair and dander: Secondary | ICD-10-CM | POA: Diagnosis not present

## 2022-12-28 ENCOUNTER — Other Ambulatory Visit (HOSPITAL_COMMUNITY): Payer: Self-pay

## 2023-01-03 DIAGNOSIS — J3089 Other allergic rhinitis: Secondary | ICD-10-CM | POA: Diagnosis not present

## 2023-01-03 DIAGNOSIS — J3081 Allergic rhinitis due to animal (cat) (dog) hair and dander: Secondary | ICD-10-CM | POA: Diagnosis not present

## 2023-01-03 DIAGNOSIS — J301 Allergic rhinitis due to pollen: Secondary | ICD-10-CM | POA: Diagnosis not present

## 2023-01-09 DIAGNOSIS — R9431 Abnormal electrocardiogram [ECG] [EKG]: Secondary | ICD-10-CM

## 2023-01-09 HISTORY — DX: Abnormal electrocardiogram (ECG) (EKG): R94.31

## 2023-01-10 ENCOUNTER — Encounter: Payer: Self-pay | Admitting: Internal Medicine

## 2023-01-10 ENCOUNTER — Ambulatory Visit: Payer: Medicare PPO | Admitting: Internal Medicine

## 2023-01-10 VITALS — BP 138/88 | HR 92 | Temp 97.7°F | Resp 16 | Ht 69.5 in | Wt 192.6 lb

## 2023-01-10 DIAGNOSIS — Z125 Encounter for screening for malignant neoplasm of prostate: Secondary | ICD-10-CM

## 2023-01-10 DIAGNOSIS — J3081 Allergic rhinitis due to animal (cat) (dog) hair and dander: Secondary | ICD-10-CM

## 2023-01-10 DIAGNOSIS — I10 Essential (primary) hypertension: Secondary | ICD-10-CM | POA: Diagnosis not present

## 2023-01-10 DIAGNOSIS — R739 Hyperglycemia, unspecified: Secondary | ICD-10-CM | POA: Diagnosis not present

## 2023-01-10 DIAGNOSIS — F321 Major depressive disorder, single episode, moderate: Secondary | ICD-10-CM | POA: Diagnosis not present

## 2023-01-10 DIAGNOSIS — R9431 Abnormal electrocardiogram [ECG] [EKG]: Secondary | ICD-10-CM | POA: Insufficient documentation

## 2023-01-10 DIAGNOSIS — E785 Hyperlipidemia, unspecified: Secondary | ICD-10-CM | POA: Diagnosis not present

## 2023-01-10 DIAGNOSIS — Z0001 Encounter for general adult medical examination with abnormal findings: Secondary | ICD-10-CM | POA: Diagnosis not present

## 2023-01-10 DIAGNOSIS — J4540 Moderate persistent asthma, uncomplicated: Secondary | ICD-10-CM | POA: Diagnosis not present

## 2023-01-10 DIAGNOSIS — R1310 Dysphagia, unspecified: Secondary | ICD-10-CM

## 2023-01-10 LAB — CBC WITH DIFFERENTIAL/PLATELET
Basophils Absolute: 0.1 10*3/uL (ref 0.0–0.1)
Basophils Relative: 0.9 % (ref 0.0–3.0)
Eosinophils Absolute: 0.3 10*3/uL (ref 0.0–0.7)
Eosinophils Relative: 4.7 % (ref 0.0–5.0)
HCT: 48.5 % (ref 39.0–52.0)
Hemoglobin: 16.3 g/dL (ref 13.0–17.0)
Lymphocytes Relative: 19 % (ref 12.0–46.0)
Lymphs Abs: 1.1 10*3/uL (ref 0.7–4.0)
MCHC: 33.5 g/dL (ref 30.0–36.0)
MCV: 93 fL (ref 78.0–100.0)
Monocytes Absolute: 0.8 10*3/uL (ref 0.1–1.0)
Monocytes Relative: 13.7 % — ABNORMAL HIGH (ref 3.0–12.0)
Neutro Abs: 3.7 10*3/uL (ref 1.4–7.7)
Neutrophils Relative %: 61.7 % (ref 43.0–77.0)
Platelets: 214 10*3/uL (ref 150.0–400.0)
RBC: 5.22 Mil/uL (ref 4.22–5.81)
RDW: 13.4 % (ref 11.5–15.5)
WBC: 6 10*3/uL (ref 4.0–10.5)

## 2023-01-10 LAB — LIPID PANEL
Cholesterol: 175 mg/dL (ref 0–200)
HDL: 56.2 mg/dL (ref >39.00)
LDL Cholesterol: 100 mg/dL — ABNORMAL HIGH (ref 0–99)
NonHDL: 118.71
Total CHOL/HDL Ratio: 3
Triglycerides: 96 mg/dL (ref 0.0–149.0)
VLDL: 19.2 mg/dL (ref 0.0–40.0)

## 2023-01-10 LAB — URINALYSIS, ROUTINE W REFLEX MICROSCOPIC
Bilirubin Urine: NEGATIVE
Hgb urine dipstick: NEGATIVE
Ketones, ur: NEGATIVE
Leukocytes,Ua: NEGATIVE
Nitrite: NEGATIVE
RBC / HPF: NONE SEEN (ref 0–?)
Specific Gravity, Urine: 1.025 (ref 1.000–1.030)
Total Protein, Urine: NEGATIVE
Urine Glucose: NEGATIVE
Urobilinogen, UA: 0.2 (ref 0.0–1.0)
pH: 6 (ref 5.0–8.0)

## 2023-01-10 LAB — BASIC METABOLIC PANEL
BUN: 17 mg/dL (ref 6–23)
CO2: 29 meq/L (ref 19–32)
Calcium: 9.3 mg/dL (ref 8.4–10.5)
Chloride: 99 meq/L (ref 96–112)
Creatinine, Ser: 0.88 mg/dL (ref 0.40–1.50)
GFR: 87.89 mL/min (ref >60.00)
Glucose, Bld: 101 mg/dL — ABNORMAL HIGH (ref 70–99)
Potassium: 4 meq/L (ref 3.5–5.1)
Sodium: 137 meq/L (ref 135–145)

## 2023-01-10 LAB — HEPATIC FUNCTION PANEL
ALT: 25 U/L (ref 0–53)
AST: 25 U/L (ref 0–37)
Albumin: 4.5 g/dL (ref 3.5–5.2)
Alkaline Phosphatase: 62 U/L (ref 39–117)
Bilirubin, Direct: 0.1 mg/dL (ref 0.0–0.3)
Total Bilirubin: 0.6 mg/dL (ref 0.2–1.2)
Total Protein: 6.7 g/dL (ref 6.0–8.3)

## 2023-01-10 LAB — CK: Total CK: 102 U/L (ref 7–232)

## 2023-01-10 LAB — TSH: TSH: 3.63 u[IU]/mL (ref 0.35–5.50)

## 2023-01-10 LAB — HEMOGLOBIN A1C: Hgb A1c MFr Bld: 6 % (ref 4.6–6.5)

## 2023-01-10 LAB — PSA: PSA: 0.35 ng/mL (ref 0.10–4.00)

## 2023-01-10 NOTE — Patient Instructions (Signed)
Health Maintenance, Male Adopting a healthy lifestyle and getting preventive care are important in promoting health and wellness. Ask your health care provider about: The right schedule for you to have regular tests and exams. Things you can do on your own to prevent diseases and keep yourself healthy. What should I know about diet, weight, and exercise? Eat a healthy diet  Eat a diet that includes plenty of vegetables, fruits, low-fat dairy products, and lean protein. Do not eat a lot of foods that are high in solid fats, added sugars, or sodium. Maintain a healthy weight Body mass index (BMI) is a measurement that can be used to identify possible weight problems. It estimates body fat based on height and weight. Your health care provider can help determine your BMI and help you achieve or maintain a healthy weight. Get regular exercise Get regular exercise. This is one of the most important things you can do for your health. Most adults should: Exercise for at least 150 minutes each week. The exercise should increase your heart rate and make you sweat (moderate-intensity exercise). Do strengthening exercises at least twice a week. This is in addition to the moderate-intensity exercise. Spend less time sitting. Even light physical activity can be beneficial. Watch cholesterol and blood lipids Have your blood tested for lipids and cholesterol at 69 years of age, then have this test every 5 years. You may need to have your cholesterol levels checked more often if: Your lipid or cholesterol levels are high. You are older than 69 years of age. You are at high risk for heart disease. What should I know about cancer screening? Many types of cancers can be detected early and may often be prevented. Depending on your health history and family history, you may need to have cancer screening at various ages. This may include screening for: Colorectal cancer. Prostate cancer. Skin cancer. Lung  cancer. What should I know about heart disease, diabetes, and high blood pressure? Blood pressure and heart disease High blood pressure causes heart disease and increases the risk of stroke. This is more likely to develop in people who have high blood pressure readings or are overweight. Talk with your health care provider about your target blood pressure readings. Have your blood pressure checked: Every 3-5 years if you are 18-39 years of age. Every year if you are 40 years old or older. If you are between the ages of 65 and 75 and are a current or former smoker, ask your health care provider if you should have a one-time screening for abdominal aortic aneurysm (AAA). Diabetes Have regular diabetes screenings. This checks your fasting blood sugar level. Have the screening done: Once every three years after age 45 if you are at a normal weight and have a low risk for diabetes. More often and at a younger age if you are overweight or have a high risk for diabetes. What should I know about preventing infection? Hepatitis B If you have a higher risk for hepatitis B, you should be screened for this virus. Talk with your health care provider to find out if you are at risk for hepatitis B infection. Hepatitis C Blood testing is recommended for: Everyone born from 1945 through 1965. Anyone with known risk factors for hepatitis C. Sexually transmitted infections (STIs) You should be screened each year for STIs, including gonorrhea and chlamydia, if: You are sexually active and are younger than 69 years of age. You are older than 69 years of age and your   health care provider tells you that you are at risk for this type of infection. Your sexual activity has changed since you were last screened, and you are at increased risk for chlamydia or gonorrhea. Ask your health care provider if you are at risk. Ask your health care provider about whether you are at high risk for HIV. Your health care provider  may recommend a prescription medicine to help prevent HIV infection. If you choose to take medicine to prevent HIV, you should first get tested for HIV. You should then be tested every 3 months for as long as you are taking the medicine. Follow these instructions at home: Alcohol use Do not drink alcohol if your health care provider tells you not to drink. If you drink alcohol: Limit how much you have to 0-2 drinks a day. Know how much alcohol is in your drink. In the [place] one drink equals one 12 oz bottle of beer (355 mL), one 5 oz glass of wine (148 mL), or one 1 oz glass of hard liquor (44 mL). Lifestyle Do not use any products that contain nicotine or tobacco. These products include cigarettes, chewing tobacco, and vaping devices, such as e-cigarettes. If you need help quitting, ask your health care provider. Do not use street drugs. Do not share needles. Ask your health care provider for help if you need support or information about quitting drugs. General instructions Schedule regular health, dental, and eye exams. Stay current with your vaccines. Tell your health care provider if: You often feel depressed. You have ever been abused or do not feel safe at home. Summary Adopting a healthy lifestyle and getting preventive care are important in promoting health and wellness. Follow your health care provider's instructions about healthy diet, exercising, and getting tested or screened for diseases. Follow your health care provider's instructions on monitoring your cholesterol and blood pressure. This information is not intended to replace advice given to you by your health care provider. Make sure you discuss any questions you have with your health care provider. Document Revised: 06/16/2020 Document Reviewed: 06/16/2020 Elsevier Patient Education  2024 [employer] Inc.  

## 2023-01-10 NOTE — Progress Notes (Signed)
Subjective:  Patient ID: Kenneth Sellers, male    DOB: [birth year]  Age: 67 y.o. MRN: [id number]  CC: Annual Exam, Hypertension, and Hyperlipidemia   HPI Kenneth Sellers presents for a CPX and f/up --  Discussed the use of AI scribe software for clinical note transcription with the patient, who gave verbal consent to proceed.  History of Present Illness   The patient, with a history of arthritis, hypertension, and neuropathy, presents with a primary complaint of arthritis in the hand, specifically at the base of the thumb. He denies any need for surgical intervention at this time. He has been very active, although not specifically exercising, due to settling an estate and preparing a house for sale. He reports fatigue, which he attributes to his increased activity level.  The patient also reports persistent tingling and numbness in his feet, which he initially thought was neuropathy. However, a neurologist, after conducting a nerve conduction study, ruled out neuropathy. The patient continues to experience these symptoms, particularly upon waking.  In addition to these issues, the patient has been experiencing difficulty swallowing pills, feeling as though they get lodged in his throat. He denies any pain associated with this and reports it only occurs with pills. He has a history of GERD, for which he underwent surgery approximately 20 years ago, and has not needed medication for it since. He has noticed an increase in belching recently, which was previously not possible due to the surgery.  The patient also reports some issues with bowel movements, feeling as though he is not fully emptying his bowels. He is currently taking a probiotic, which he feels is not working as well as it used to.        Outpatient Medications Prior to Visit  Medication Sig Dispense Refill  . albuterol (VENTOLIN HFA) 108 (90 Base) MCG/ACT inhaler Inhale 1-2 puffs into the lungs every 4-6 hours as needed for  cough/wheeze. 6.7 g 0  . aliskiren (TEKTURNA) 150 MG tablet Take 1 tablet (150 mg total) by mouth daily. 90 tablet 1  . AMBULATORY NON FORMULARY MEDICATION CPAP machine nightly    . azelastine (ASTELIN) 0.1 % nasal spray Place 2 sprays into both nostrils 2 (two) times daily. Use in each nostril as directed (Patient taking differently: Place 2 sprays into both nostrils as needed for rhinitis or allergies. Use in each nostril as directed) 30 mL 5  . DULoxetine (CYMBALTA) 60 MG capsule Take 1 capsule (60 mg total) by mouth daily. 90 capsule 1  . EPINEPHrine 0.3 mg/0.3 mL IJ SOAJ injection Inject as directed as needed 60 each 1  . ezetimibe-simvastatin (VYTORIN) 10-20 MG tablet Take 1 tablet by mouth at bedtime. 90 tablet 1  . fluticasone (FLONASE) 50 MCG/ACT nasal spray Place 1 - 2 sprays in each nostril once a day (Patient taking differently: Place 2 sprays into both nostrils as needed for allergies or rhinitis.) 48 g 1  . Fluticasone-Umeclidin-Vilant (TRELEGY ELLIPTA) 200-62.5-25 MCG/ACT AEPB Take 1 puff by mouth daily. 60 each 6  . gabapentin (NEURONTIN) 100 MG capsule Take 2 capsules (200 mg total) by mouth 3 (three) times daily. 540 capsule 0  . levocetirizine (XYZAL) 5 MG tablet Take 1 tablet (5 mg total) by mouth daily. 90 tablet 2  . montelukast (SINGULAIR) 10 MG tablet Take 1 tablet (10 mg total) by mouth daily. 90 tablet 3  . Multiple Vitamins-Minerals (MULTIVITAMIN ADULTS PO) Take 1 tablet by mouth daily.     . Probiotic Product (  RESTORA) CAPS TAKE 1 CAPSULE BY MOUTH DAILY. 90 capsule 1  . tadalafil (CIALIS) 20 MG tablet Take 1 tablet (20 mg total) by mouth as needed. (Patient taking differently: Take 20 mg by mouth daily.) 20 tablet 11  . EPINEPHrine 0.3 mg/0.3 mL IJ SOAJ injection Inject 1 pen as directed as needed for systemic reactions 1 each 1  . fluticasone furoate-vilanterol (BREO ELLIPTA) 200-25 MCG/ACT AEPB Inhale 1 puff into the lungs daily. 60 each 3   No facility-administered  medications prior to visit.    ROS Review of Systems  Constitutional:  Negative for appetite change, chills, diaphoresis, fatigue, fever and unexpected weight change.  HENT:  Positive for trouble swallowing. Negative for sinus pressure.   Respiratory: Negative.  Negative for cough, chest tightness, shortness of breath and wheezing.   Cardiovascular:  Negative for chest pain, palpitations and leg swelling.  Gastrointestinal: Negative.  Negative for abdominal pain.  Endocrine: Negative.   Genitourinary: Negative.  Negative for difficulty urinating.  Musculoskeletal:  Positive for arthralgias. Negative for gait problem, joint swelling and myalgias.  Skin: Negative.   Neurological:  Positive for tremors and numbness. Negative for weakness.  Hematological:  Negative for adenopathy. Does not bruise/bleed easily.  Psychiatric/Behavioral: Negative.      Objective:  BP 138/88 (BP Location: Left Arm, Patient Position: Sitting, Cuff Size: Normal)   Pulse 92   Temp 97.7 F (36.5 C) (Oral)   Resp 16   Ht 5' 9.5" (1.765 m)   Wt 192 lb 9.6 oz (87.4 kg)   SpO2 96%   BMI 28.03 kg/m   BP Readings from Last 3 Encounters:  01/10/23 138/88  07/09/22 134/82  04/30/22 118/72    Wt Readings from Last 3 Encounters:  01/10/23 192 lb 9.6 oz (87.4 kg)  07/08/22 188 lb (85.3 kg)  04/30/22 181 lb 9.6 oz (82.4 kg)    Physical Exam Vitals reviewed.  Constitutional:      Appearance: Normal appearance.  HENT:     Nose: Nose normal.     Mouth/Throat:     Mouth: Mucous membranes are moist.  Eyes:     General: No scleral icterus.    Conjunctiva/sclera: Conjunctivae normal.  Cardiovascular:     Rate and Rhythm: Normal rate and regular rhythm.     Heart sounds: No murmur heard.    No gallop.     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     Breath sounds: No stridor. No wheezing, rhonchi or rales.   Abdominal:     General: Abdomen is flat.     Palpations: There is no mass.     Tenderness: There is no abdominal tenderness. There is no guarding or rebound.     Hernia: No hernia is present. There is no hernia in the left inguinal area or right inguinal area.  Genitourinary:    Pubic Area: No rash.      Penis: Normal and circumcised.      Testes: Normal.     Epididymis:     Right: Normal.     Left: Normal.     Prostate: Normal. Not enlarged, not tender and no nodules present.     Rectum: Normal. Guaiac result negative. No mass, tenderness, anal fissure, external hemorrhoid or internal hemorrhoid. Normal anal tone.  Musculoskeletal:     Cervical back: Neck supple.     Right lower leg:  No edema.     Left lower leg: No edema.  Lymphadenopathy:     Cervical: No cervical adenopathy.     Lower Body: No right inguinal adenopathy. No left inguinal adenopathy.  Skin:    General: Skin is warm and dry.     Coloration: Skin is not pale.  Neurological:     General: No focal deficit present.     Mental Status: He is alert. Mental status is at baseline.  Psychiatric:        Mood and Affect: Mood normal.        Behavior: Behavior normal.        Thought Content: Thought content normal.        Judgment: Judgment normal.    Lab Results  Component Value Date   WBC 6.0 01/10/2023   HGB 16.3 01/10/2023   HCT 48.5 01/10/2023   PLT 214.0 01/10/2023   GLUCOSE 101 (H) 01/10/2023   CHOL 175 01/10/2023   TRIG 96.0 01/10/2023   HDL 56.20 01/10/2023   LDLCALC 100 (H) 01/10/2023   ALT 25 01/10/2023   AST 25 01/10/2023   NA 137 01/10/2023   K 4.0 01/10/2023   CL 99 01/10/2023   CREATININE 0.88 01/10/2023   BUN 17 01/10/2023   CO2 29 01/10/2023   TSH 3.63 01/10/2023   PSA 0.35 01/10/2023   HGBA1C 6.0 01/10/2023    NM BRAIN DATSCAN TUMOR LOC INFLAM SPECT 1 DAY  Result Date: 03/23/2022 CLINICAL DATA:  67 year old male with longstanding tremor. Increasing intensity. EXAM: NUCLEAR MEDICINE  BRAIN IMAGING WITH SPECT  (DaTscan ) TECHNIQUE: SPECT images of the brain were obtained after intravenous injection of radiopharmaceutical. 4 hour post injection imaging. Appropriate positioning. 130 mg IO STAT given orally for thyroid blockade. RADIOPHARMACEUTICALS:  4.6 millicuries I 123 Ioflupane COMPARISON:  None Available. FINDINGS: Normal, shape of the LEFT and RIGHT striata. There is greater relative activity in the LEFT striatum compared to the RIGHT. No focal deficit identified. IMPRESSION: Mild asymmetry of activity within the striata. Findings within normal limits. Of note, DaTSCAN is not diagnostic of Parkinsonian syndromes, which remains a clinical diagnosis. DaTscan is an adjuvant test to aid in the clinical diagnosis of Parkinsonian syndromes. Electronically Signed   By: Genevive Bi M.D.   On: 03/23/2022 16:12    Assessment & Plan:   Hypertension, unspecified type- His BP is well controlled. -     EKG 12-Lead -     TSH; Future -     Urinalysis, Routine w reflex microscopic; Future -     Hepatic function panel; Future -     Basic metabolic panel; Future -     CBC with Differential/Platelet; Future  Hyperlipidemia with target LDL less than 130 - LDL goal achieved. Doing well on the statin  -     Lipid panel; Future -     TSH; Future -     Hepatic function panel; Future -     CK; Future  Encounter for general adult medical examination with abnormal findings- Exam completed, labs reviewed, vaccines reviewed, cancer screenings addressed, pt ed material was given.   Moderate persistent asthma, uncomplicated -     CBC with Differential/Platelet; Future  Screening for prostate cancer -     PSA; Future  Current moderate episode of major depressive disorder without prior episode (HCC) -     TSH; Future -     Hepatic function panel; Future  Hyperglycemia- He is prediabetic. -  Hemoglobin A1c; Future -     Basic metabolic panel; Future  Abnormal EKG -     ECHOCARDIOGRAM  COMPLETE; Future  Pill dysphagia -     Ambulatory referral to Gastroenterology     Follow-up: Return in about 6 months (around 07/11/2023).  Kenneth Linger, MD

## 2023-01-14 DIAGNOSIS — R1310 Dysphagia, unspecified: Secondary | ICD-10-CM | POA: Insufficient documentation

## 2023-01-20 ENCOUNTER — Other Ambulatory Visit: Payer: Self-pay

## 2023-01-20 ENCOUNTER — Other Ambulatory Visit: Payer: Self-pay | Admitting: Internal Medicine

## 2023-01-20 ENCOUNTER — Other Ambulatory Visit (HOSPITAL_COMMUNITY): Payer: Self-pay

## 2023-01-20 DIAGNOSIS — I10 Essential (primary) hypertension: Secondary | ICD-10-CM

## 2023-01-20 DIAGNOSIS — F321 Major depressive disorder, single episode, moderate: Secondary | ICD-10-CM

## 2023-01-20 DIAGNOSIS — G250 Essential tremor: Secondary | ICD-10-CM

## 2023-01-20 DIAGNOSIS — E785 Hyperlipidemia, unspecified: Secondary | ICD-10-CM

## 2023-01-20 DIAGNOSIS — L280 Lichen simplex chronicus: Secondary | ICD-10-CM

## 2023-01-20 MED ORDER — GABAPENTIN 100 MG PO CAPS
200.0000 mg | ORAL_CAPSULE | Freq: Three times a day (TID) | ORAL | 1 refills | Status: DC
Start: 2023-01-20 — End: 2023-12-30
  Filled 2023-01-20: qty 540, 90d supply, fill #0
  Filled 2023-04-21 – 2023-04-22 (×2): qty 540, 90d supply, fill #1

## 2023-01-20 MED ORDER — EZETIMIBE-SIMVASTATIN 10-20 MG PO TABS
1.0000 | ORAL_TABLET | Freq: Every day | ORAL | 1 refills | Status: DC
  Filled 2023-01-20 – 2023-03-11 (×2): qty 90, 90d supply, fill #0
  Filled 2023-05-31 – 2023-06-01 (×2): qty 90, 90d supply, fill #1

## 2023-01-20 MED ORDER — ALISKIREN FUMARATE 150 MG PO TABS
150.0000 mg | ORAL_TABLET | Freq: Every day | ORAL | 1 refills | Status: DC
  Filled 2023-01-20: qty 90, 90d supply, fill #0
  Filled 2023-04-21: qty 90, 90d supply, fill #1

## 2023-01-20 MED ORDER — DULOXETINE HCL 60 MG PO CPEP
60.0000 mg | ORAL_CAPSULE | Freq: Every day | ORAL | 1 refills | Status: DC
  Filled 2023-01-20: qty 90, 90d supply, fill #0
  Filled 2023-04-21 – 2023-04-22 (×2): qty 90, 90d supply, fill #1

## 2023-01-22 ENCOUNTER — Other Ambulatory Visit (HOSPITAL_COMMUNITY): Payer: Self-pay

## 2023-01-26 ENCOUNTER — Telehealth: Payer: Self-pay

## 2023-01-26 NOTE — Telephone Encounter (Signed)
Copied from CRM [id number]. Topic: Referral - Status >> Jan 26, 2023 12:10 PM Corin V wrote: Reason for CRM: Patient has not been able to reach the [facility] at [facility] to schedule echocardiogram. I confirmed he has called [phone] (number provided by office). Patient is requesting assistance from clinic in getting appointment scheduled or having clinic reach out to Heart Clinic to call patient and schedule.

## 2023-01-27 NOTE — Telephone Encounter (Signed)
Patient states that he spoke with somebody this morning and they advised him they would get authorization from his insurance and then give him a call back to have him scheduled. He gave them a verbal understanding and advised me he didn't need anymore assistance.

## 2023-01-28 ENCOUNTER — Other Ambulatory Visit (HOSPITAL_COMMUNITY): Payer: Self-pay

## 2023-02-21 ENCOUNTER — Other Ambulatory Visit (HOSPITAL_COMMUNITY): Payer: Self-pay

## 2023-02-22 ENCOUNTER — Other Ambulatory Visit (HOSPITAL_COMMUNITY): Payer: Self-pay

## 2023-02-22 MED ORDER — TADALAFIL 20 MG PO TABS
20.0000 mg | ORAL_TABLET | Freq: Two times a day (BID) | ORAL | 11 refills | Status: DC | PRN
  Filled 2023-02-22: qty 20, 20d supply, fill #0
  Filled 2023-05-31 – 2023-06-01 (×2): qty 20, 20d supply, fill #1
  Filled 2023-06-13 – 2023-08-01 (×3): qty 20, 20d supply, fill #0

## 2023-02-28 ENCOUNTER — Ambulatory Visit (INDEPENDENT_AMBULATORY_CARE_PROVIDER_SITE_OTHER): Payer: Medicare PPO

## 2023-02-28 DIAGNOSIS — R9431 Abnormal electrocardiogram [ECG] [EKG]: Secondary | ICD-10-CM | POA: Diagnosis not present

## 2023-02-28 LAB — ECHOCARDIOGRAM COMPLETE
AR max vel: 2.96 cm2
AV Area VTI: 3.16 cm2
AV Area mean vel: 2.64 cm2
AV Mean grad: 4 mm[Hg]
AV Peak grad: 7.8 mm[Hg]
Ao pk vel: 1.4 m/s
Area-P 1/2: 4.89 cm2
S' Lateral: 2.88 cm

## 2023-02-28 MED ORDER — PERFLUTREN LIPID MICROSPHERE
1.0000 mL | INTRAVENOUS | Status: AC | PRN
  Administered 2023-02-28: 4 mL via INTRAVENOUS

## 2023-03-01 ENCOUNTER — Encounter: Payer: Self-pay | Admitting: Internal Medicine

## 2023-03-02 ENCOUNTER — Ambulatory Visit: Payer: Medicare PPO

## 2023-03-02 VITALS — BP 140/80 | HR 117 | Ht 69.5 in | Wt 199.4 lb

## 2023-03-02 DIAGNOSIS — Z0000 Encounter for general adult medical examination without abnormal findings: Secondary | ICD-10-CM | POA: Diagnosis not present

## 2023-03-02 NOTE — Progress Notes (Signed)
Subjective:   Kenneth Sellers is a 68 y.o. male who presents for Medicare Annual/Subsequent preventive examination.  Visit Complete: In person  Patient Medicare AWV questionnaire was completed by the patient on 02/28/2023; I have confirmed that all information answered by patient is correct and no changes since this date.  Cardiac Risk Factors include: advanced age (>63men, >26 women);male gender;hypertension;Other (see comment);dyslipidemia, Risk factor comments: OAS,     Objective:    Today's Vitals   03/02/23 1051  BP: (!) 140/80  Pulse: (!) 117  SpO2: 99%  Weight: 199 lb 6.4 oz (90.4 kg)  Height: 5' 9.5" (1.765 m)   Body mass index is 29.02 kg/m.     03/02/2023   10:58 AM 04/30/2022    1:00 PM 02/18/2022    9:41 AM 01/06/2022    8:48 AM 01/19/2021    8:07 AM 03/26/2019   11:56 AM 10/30/2015   10:54 AM  Advanced Directives  Does Patient Have a Medical Advance Directive? Yes Yes No Yes Yes No No  Type of Estate agent of [place];Living will Living will  Healthcare Power of [employer] Power of Attorney    Does patient want to make changes to medical advance directive?    No - Patient declined     Copy of Healthcare Power of Attorney in Chart? No - copy requested   No - copy requested No - copy requested    Would patient like information on creating a medical advance directive?      Yes (ED - Information included in AVS)     Current Medications (verified) Outpatient Encounter Medications as of 03/02/2023  Medication Sig   albuterol (VENTOLIN HFA) 108 (90 Base) MCG/ACT inhaler Inhale 1-2 puffs into the lungs every 4-6 hours as needed for cough/wheeze.   aliskiren (TEKTURNA) 150 MG tablet Take 1 tablet (150 mg total) by mouth daily.   AMBULATORY NON FORMULARY MEDICATION CPAP machine nightly   azelastine (ASTELIN) 0.1 % nasal spray Place 2 sprays into both nostrils 2 (two) times daily. Use in each nostril as directed (Patient taking differently:  Place 2 sprays into both nostrils as needed for rhinitis or allergies. Use in each nostril as directed)   DULoxetine (CYMBALTA) 60 MG capsule Take 1 capsule (60 mg total) by mouth daily.   EPINEPHrine 0.3 mg/0.3 mL IJ SOAJ injection Inject as directed as needed   ezetimibe-simvastatin (VYTORIN) 10-20 MG tablet Take 1 tablet by mouth at bedtime.   fluticasone (FLONASE) 50 MCG/ACT nasal spray Place 1 - 2 sprays in each nostril once a day (Patient taking differently: Place 2 sprays into both nostrils as needed for allergies or rhinitis.)   Fluticasone-Umeclidin-Vilant (TRELEGY ELLIPTA) 200-62.5-25 MCG/ACT AEPB Take 1 puff by mouth daily.   gabapentin (NEURONTIN) 100 MG capsule Take 2 capsules (200 mg total) by mouth 3 (three) times daily.   levocetirizine (XYZAL) 5 MG tablet Take 1 tablet (5 mg total) by mouth daily.   montelukast (SINGULAIR) 10 MG tablet Take 1 tablet (10 mg total) by mouth daily.   Multiple Vitamins-Minerals (MULTIVITAMIN ADULTS PO) Take 1 tablet by mouth daily.    Probiotic Product (RESTORA) CAPS TAKE 1 CAPSULE BY MOUTH DAILY.   tadalafil (CIALIS) 20 MG tablet Take 1 tablet by mouth as directed as needed   No facility-administered encounter medications on file as of 03/02/2023.    Allergies (verified) Patient has no known allergies.   History: Past Medical History:  Diagnosis Date   Allergic rhinitis  Dr. [place] Callas   Allergy    Arthritis    hands   Asthma    Cataract    small immature left eye   Diverticulosis of colon    DJD (degenerative joint disease)    lower back   History of kidney stones 2020   x 1 passed on own   Hyperlipidemia    Hypertension    OSA (obstructive sleep apnea)    PSG 08/26/02 AHI 59--CPAP 7 cm   Phimosis 01/14/2021   Pneumonia 1988   treated as OP   Sleep apnea    wears cpap    Tachycardia 2003   Dr. Patty Sermons, negative cardiac work up   Past Surgical History:  Procedure Laterality Date   CIRCUMCISION N/A 01/19/2021   Procedure:  CIRCUMCISION ADULT;  Surgeon: Marcine Matar, MD;  Location: [facility];  Service: Urology;  Laterality: N/A;   colon tics  03/2005   [facility]   COLONOSCOPY  03/2005   colonscopy  12/25/2018   EYE SURGERY Right 2020   right ioc lens replacement for cataract   lesion removed L neck; pigmented mole at hairline, non melanoma  07/2003   NISSEN FUNDOPLICATION  1997    Dr Jamey Ripa; complicated by herniation post op with retching requiring 2nd surgery   POLYPECTOMY     RHINOPLASTY  1983   Family History  Problem Relation Age of Onset   Diabetes Mother    Hypertension Mother    Stroke Mother 69   Heart failure Mother    Heart attack Father 44   CVA Sister    Diabetes Sister    Allergies Sister    CVA Sister    CVA Sister    Arthritis Sister    Hypertension Brother    Heart disease Brother    Diabetes Brother        2 brothers   Heart attack Brother 43       smoker   COPD Brother        died in his 31s   Cancer Neg Hx    Early death Neg Hx    Hyperlipidemia Neg Hx    Kidney disease Neg Hx    Colon cancer Neg Hx    Colon polyps Neg Hx    Esophageal cancer Neg Hx    Rectal cancer Neg Hx    Stomach cancer Neg Hx    Social History   Socioeconomic History   Marital status: Married    Spouse name: [place]   Number of children: 2   Years of education: Not on file   Highest education level: Bachelor's degree (e.g., BA, AB, BS)  Occupational History   Occupation: retired    Comment: Runner, broadcasting/film/video  Tobacco Use   Smoking status: Never   Smokeless tobacco: Never  Vaping Use   Vaping status: Never Used  Substance and Sexual Activity   Alcohol use: Yes    Alcohol/week: 2.0 standard drinks of alcohol    Types: 2 Cans of beer per week    Comment: occasionally   Drug use: No   Sexual activity: Yes  Other Topics Concern   Not on file  Social History Narrative   Lives with wife-2025   Social Drivers of Health   Financial Resource Strain: Low Risk   (02/28/2023)   Overall Financial Resource Strain (CARDIA)    Difficulty of Paying Living Expenses: Not hard at all  Food Insecurity: No Food Insecurity (02/28/2023)   Hunger Vital Sign  Worried About Programme researcher, broadcasting/film/video in the Last Year: Never true    Ran Out of Food in the Last Year: Never true  Transportation Needs: No Transportation Needs (02/28/2023)   PRAPARE - Administrator, Civil Service (Medical): No    Lack of Transportation (Non-Medical): No  Physical Activity: Unknown (02/28/2023)   Exercise Vital Sign    Days of Exercise per Week: 0 days    Minutes of Exercise per Session: Not on file  Stress: Stress Concern Present (02/28/2023)   [employer] of Occupational Health - Occupational Stress Questionnaire    Feeling of Stress : To some extent  Social Connections: Socially Integrated (02/28/2023)   Social Connection and Isolation Panel [NHANES]    Frequency of Communication with Friends and Family: More than three times a week    Frequency of Social Gatherings with Friends and Family: Once a week    Attends Religious Services: More than 4 times per year    Active Member of [employer] or Organizations: Yes    Attends Engineer, structural: More than 4 times per year    Marital Status: Married    Tobacco Counseling Counseling given: Not Answered   Clinical Intake:  Pre-visit preparation completed: Yes  Pain : No/denies pain     BMI - recorded: 29.02 Nutritional Status: BMI 25 -29 Overweight Nutritional Risks: None  How often do you need to have someone help you when you read instructions, pamphlets, or other written materials from your doctor or pharmacy?: 1 - Never  Interpreter Needed?: No  Information entered by :: Breland Elders, RMA   Activities of Daily Living    02/28/2023    1:13 AM  In your present state of health, do you have any difficulty performing the following activities:  Hearing? 0  Vision? 0  Difficulty concentrating or  making decisions? 0  Walking or climbing stairs? 0  Dressing or bathing? 0  Doing errands, shopping? 0  Preparing Food and eating ? N  Using the Toilet? N  Do you have problems with loss of bowel control? N  Managing your Medications? N  Managing your Finances? N  Housekeeping or managing your Housekeeping? N    Patient Care Team: Etta Grandchild, MD as PCP - General (Internal Medicine) Sinda Du, MD as Consulting Physician (Ophthalmology)  Indicate any recent Medical Services you may have received from other than [facility] providers in the past year (date may be approximate).     Assessment:   This is a routine wellness examination for Gaddiel.  Hearing/Vision screen Hearing Screening - Comments:: Denies hearing difficulties   Vision Screening - Comments:: Denies vision issues.    Goals Addressed             This Visit's Progress    Patient Stated   Not on track    I want to start to go back to the gym.       Depression Screen    03/02/2023   11:00 AM 01/10/2023    9:16 AM 07/08/2022    9:56 AM 01/06/2022    8:49 AM 10/14/2021    3:45 PM 07/07/2021    9:05 AM 01/05/2021    8:59 AM  PHQ 2/9 Scores  PHQ - 2 Score 0 0 0 0 1 0 0  PHQ- 9 Score 0 0 0   2     Fall Risk    02/28/2023    1:13 AM 01/10/2023    9:16 AM  04/30/2022    1:00 PM 02/18/2022    9:40 AM 01/06/2022    8:50 AM  Fall Risk   Falls in the past year? 1 0 0 0 0  Number falls in past yr: 0 0 0 0 0  Injury with Fall? 0 0 0 0 0  Risk for fall due to :  No Fall Risks   No Fall Risks  Follow up Falls evaluation completed;Falls prevention discussed Falls evaluation completed Falls evaluation completed Falls evaluation completed Falls evaluation completed    MEDICARE RISK AT HOME: Medicare Risk at Home Any stairs in or around the home?: (Patient-Rptd) Yes If so, are there any without handrails?: (Patient-Rptd) No Home free of loose throw rugs in walkways, pet beds, electrical cords, etc?: (Patient-Rptd)  Yes Adequate lighting in your home to reduce risk of falls?: (Patient-Rptd) Yes Life alert?: (Patient-Rptd) No Use of a cane, walker or w/c?: (Patient-Rptd) No Grab bars in the bathroom?: (Patient-Rptd) No Shower chair or bench in shower?: (Patient-Rptd) No Elevated toilet seat or a handicapped toilet?: (Patient-Rptd) Yes  TIMED UP AND GO:  Was the test performed?  Yes  Length of time to ambulate 10 feet: 10 sec Gait steady and fast without use of assistive device    Cognitive Function:        03/02/2023   10:43 AM 01/06/2022    8:50 AM  6CIT Screen  What Year? 0 points 0 points  What month? 0 points 0 points  What time? 0 points 0 points  Count back from 20 0 points 0 points  Months in reverse 0 points 0 points  Repeat phrase 0 points 0 points  Total Score 0 points 0 points    Immunizations Immunization History  Administered Date(s) Administered   Fluad Quad(high Dose 65+) 11/18/2018, 11/22/2019, 11/24/2022   Influenza Whole 01/10/2007, 10/28/2009   Influenza, High Dose Seasonal PF 01/21/2017, 01/25/2018, 01/24/2019   Influenza,inj,Quad PF,6+ Mos 12/14/2012, 11/21/2014, 11/25/2015, 11/27/2016, 11/16/2017   Influenza-Unspecified 11/11/2020   PFIZER Comirnaty(Gray Top)Covid-19 Tri-Sucrose Vaccine 06/20/2020   PFIZER(Purple Top)SARS-COV-2 Vaccination 02/26/2019, 04/02/2019, 07/30/2019, 11/13/2019   Pfizer Covid-19 Vaccine Bivalent Booster 62yrs & up 11/04/2020   Pneumococcal Conjugate-13 12/28/2018   Pneumococcal Polysaccharide-23 04/27/2017   Tdap 09/16/2014   Zoster Recombinant(Shingrix) 01/05/2021, 04/09/2021    TDAP status: Up to date  Flu Vaccine status: Up to date  Pneumococcal vaccine status: Up to date  Covid-19 vaccine status: Completed vaccines  Qualifies for Shingles Vaccine? Yes   Zostavax completed Yes   Shingrix Completed?: Yes  Screening Tests Health Maintenance  Topic Date Due   Pneumonia Vaccine 66+ Years old (3 of 3 - PPSV23 or PCV20)  04/28/2022   COVID-19 Vaccine (7 - 2024-25 season) 10/10/2022   Colonoscopy  12/25/2023   Medicare Annual Wellness (AWV)  03/01/2024   DTaP/Tdap/Td (2 - Td or Tdap) 09/15/2024   INFLUENZA VACCINE  Completed   Hepatitis C Screening  Completed   Zoster Vaccines- Shingrix  Completed   HPV VACCINES  Aged Out    Health Maintenance  Health Maintenance Due  Topic Date Due   Pneumonia Vaccine 62+ Years old (3 of 3 - PPSV23 or PCV20) 04/28/2022   COVID-19 Vaccine (7 - 2024-25 season) 10/10/2022    Colorectal cancer screening: Type of screening: Colonoscopy. Completed 12/25/2018. Repeat every 5 years  Lung Cancer Screening: (Low Dose CT Chest recommended if Age 14-80 years, 20 pack-year currently smoking OR have quit w/in 15years.) does not qualify.   Lung Cancer Screening Referral:  N/A  Additional Screening:  Hepatitis C Screening: does qualify; Completed 09/23/2015  Vision Screening: Recommended annual ophthalmology exams for early detection of glaucoma and other disorders of the eye. Is the patient up to date with their annual eye exam?  Yes  Who is the provider or what is the name of the office in which the patient attends annual eye exams? Dr. Cathey Endow If pt is not established with a provider, would they like to be referred to a provider to establish care? Yes .   Dental Screening: Recommended annual dental exams for proper oral hygiene   Community Resource Referral / Chronic Care Management: CRR required this visit?  No   CCM required this visit?  No     Plan:     I have personally reviewed and noted the following in the patient's chart:   Medical and social history Use of alcohol, tobacco or illicit drugs  Current medications and supplements including opioid prescriptions. Patient is not currently taking opioid prescriptions. Functional ability and status Nutritional status Physical activity Advanced directives List of other physicians Hospitalizations, surgeries,  and ER visits in previous 12 months Vitals Screenings to include cognitive, depression, and falls Referrals and appointments  In addition, I have reviewed and discussed with patient certain preventive protocols, quality metrics, and best practice recommendations. A written personalized care plan for preventive services as well as general preventive health recommendations were provided to patient.     Darsha Zumstein L Lorraine Cimmino, CMA   03/02/2023   After Visit Summary: (MyChart) Due to this being a telephonic visit, the after visit summary with patients personalized plan was offered to patient via MyChart   Nurse Notes: Patient is up to date with all health maintenance.  He has no concerns to address today.

## 2023-03-02 NOTE — Patient Instructions (Addendum)
Kenneth Sellers , Thank you for taking time to come for your Medicare Wellness Visit. I appreciate your ongoing commitment to your health goals. Please review the following plan we discussed and let me know if I can assist you in the future.   Referrals/Orders/Follow-Ups/Clinician Recommendations: It was nice meeting you today.  You are due for a PCV20 (Pneumococcal conjugate vaccines), however, this is optional.  Aim for 30 minutes of exercise or brisk walking, 6-8 glasses of water, and 5 servings of fruits and vegetables each day.   This is a list of the screening recommended for you and due dates:  Health Maintenance  Topic Date Due   Pneumonia Vaccine (3 of 3 - PPSV23 or PCV20) 04/28/2022   COVID-19 Vaccine (7 - 2024-25 season) 10/10/2022   Colon Cancer Screening  12/25/2023   Medicare Annual Wellness Visit  03/01/2024   DTaP/Tdap/Td vaccine (2 - Td or Tdap) 09/15/2024   Flu Shot  Completed   Hepatitis C Screening  Completed   Zoster (Shingles) Vaccine  Completed   HPV Vaccine  Aged Out    Advanced directives: (Copy Requested) Please bring a copy of your health care power of attorney and living will to the office to be added to your chart at your convenience.  Next Medicare Annual Wellness Visit scheduled for next year: Yes

## 2023-03-11 ENCOUNTER — Other Ambulatory Visit (HOSPITAL_COMMUNITY): Payer: Self-pay

## 2023-03-11 ENCOUNTER — Other Ambulatory Visit: Payer: Self-pay

## 2023-03-23 DIAGNOSIS — G4733 Obstructive sleep apnea (adult) (pediatric): Secondary | ICD-10-CM | POA: Diagnosis not present

## 2023-04-04 DIAGNOSIS — G4733 Obstructive sleep apnea (adult) (pediatric): Secondary | ICD-10-CM | POA: Diagnosis not present

## 2023-04-08 DIAGNOSIS — J3081 Allergic rhinitis due to animal (cat) (dog) hair and dander: Secondary | ICD-10-CM | POA: Diagnosis not present

## 2023-04-08 DIAGNOSIS — J301 Allergic rhinitis due to pollen: Secondary | ICD-10-CM | POA: Diagnosis not present

## 2023-04-08 DIAGNOSIS — J3089 Other allergic rhinitis: Secondary | ICD-10-CM | POA: Diagnosis not present

## 2023-04-13 DIAGNOSIS — J3081 Allergic rhinitis due to animal (cat) (dog) hair and dander: Secondary | ICD-10-CM | POA: Diagnosis not present

## 2023-04-13 DIAGNOSIS — J3089 Other allergic rhinitis: Secondary | ICD-10-CM | POA: Diagnosis not present

## 2023-04-13 DIAGNOSIS — J4540 Moderate persistent asthma, uncomplicated: Secondary | ICD-10-CM | POA: Diagnosis not present

## 2023-04-13 DIAGNOSIS — J301 Allergic rhinitis due to pollen: Secondary | ICD-10-CM | POA: Diagnosis not present

## 2023-04-19 DIAGNOSIS — J3081 Allergic rhinitis due to animal (cat) (dog) hair and dander: Secondary | ICD-10-CM | POA: Diagnosis not present

## 2023-04-19 DIAGNOSIS — J3089 Other allergic rhinitis: Secondary | ICD-10-CM | POA: Diagnosis not present

## 2023-04-19 DIAGNOSIS — J301 Allergic rhinitis due to pollen: Secondary | ICD-10-CM | POA: Diagnosis not present

## 2023-04-22 ENCOUNTER — Other Ambulatory Visit: Payer: Self-pay

## 2023-04-22 ENCOUNTER — Other Ambulatory Visit (HOSPITAL_COMMUNITY): Payer: Self-pay

## 2023-04-26 ENCOUNTER — Other Ambulatory Visit (HOSPITAL_COMMUNITY): Payer: Self-pay

## 2023-04-26 ENCOUNTER — Other Ambulatory Visit: Payer: Self-pay

## 2023-04-26 DIAGNOSIS — J3081 Allergic rhinitis due to animal (cat) (dog) hair and dander: Secondary | ICD-10-CM | POA: Diagnosis not present

## 2023-04-26 DIAGNOSIS — J3089 Other allergic rhinitis: Secondary | ICD-10-CM | POA: Diagnosis not present

## 2023-04-26 DIAGNOSIS — J301 Allergic rhinitis due to pollen: Secondary | ICD-10-CM | POA: Diagnosis not present

## 2023-04-27 NOTE — Progress Notes (Unsigned)
 Chief Complaint:pill dysphagia Primary GI Doctor:(previously Dr. Russella Dar)   HPI:  Patient is a  68  year old male patient with past medical history of *****who was referred to me by Etta Grandchild, MD on 01/14/23 for a complaint of pill dysphagia .    01/10/20 patient last seen in GI office by Dr. Russella Dar***  Interval History  Patient admits/denies GERD Patient admits/denies dysphagia Patient admits/denies nausea, vomiting, or weight loss  Patient admits/denies altered bowel habits Patient admits/denies abdominal pain Patient admits/denies rectal bleeding   Denies/Admits alcohol Denies/Admits smoking Denies/Admits NSAID use. Denies/Admits they are on blood thinners.  Patients last colonoscopy Patients last EGD  Patient's family history includes  Wt Readings from Last 3 Encounters:  03/02/23 199 lb 6.4 oz (90.4 kg)  01/10/23 192 lb 9.6 oz (87.4 kg)  07/08/22 188 lb (85.3 kg)      Past Medical History:  Diagnosis Date   Allergic rhinitis    Dr. [place] Callas   Allergy    Arthritis    hands   Asthma    Cataract    small immature left eye   Diverticulosis of colon    DJD (degenerative joint disease)    lower back   History of kidney stones 2020   x 1 passed on own   Hyperlipidemia    Hypertension    OSA (obstructive sleep apnea)    PSG 08/26/02 AHI 59--CPAP 7 cm   Phimosis 01/14/2021   Pneumonia 1988   treated as OP   Sleep apnea    wears cpap    Tachycardia 2003   Dr. Patty Sermons, negative cardiac work up    Past Surgical History:  Procedure Laterality Date   CIRCUMCISION N/A 01/19/2021   Procedure: CIRCUMCISION ADULT;  Surgeon: Marcine Matar, MD;  [facility];  Service: Urology;  Laterality: N/A;   colon tics  03/2005   [facility]   COLONOSCOPY  03/2005   colonscopy  12/25/2018   EYE SURGERY Right 2020   right ioc lens replacement for cataract   lesion removed L neck; pigmented mole at hairline, non melanoma  07/2003    NISSEN FUNDOPLICATION  1997    Dr Jamey Ripa; complicated by herniation post op with retching requiring 2nd surgery   POLYPECTOMY     RHINOPLASTY  1983    Current Outpatient Medications  Medication Sig Dispense Refill   albuterol (VENTOLIN HFA) 108 (90 Base) MCG/ACT inhaler Inhale 1-2 puffs into the lungs every 4-6 hours as needed for cough/wheeze. 6.7 g 0   aliskiren (TEKTURNA) 150 MG tablet Take 1 tablet (150 mg total) by mouth daily. 90 tablet 1   AMBULATORY NON FORMULARY MEDICATION CPAP machine nightly     azelastine (ASTELIN) 0.1 % nasal spray Place 2 sprays into both nostrils 2 (two) times daily. Use in each nostril as directed (Patient taking differently: Place 2 sprays into both nostrils as needed for rhinitis or allergies. Use in each nostril as directed) 30 mL 5   DULoxetine (CYMBALTA) 60 MG capsule Take 1 capsule (60 mg total) by mouth daily. 90 capsule 1   EPINEPHrine 0.3 mg/0.3 mL IJ SOAJ injection Inject as directed as needed 60 each 1   ezetimibe-simvastatin (VYTORIN) 10-20 MG tablet Take 1 tablet by mouth at bedtime. 90 tablet 1   fluticasone (FLONASE) 50 MCG/ACT nasal spray Place 1 - 2 sprays in each nostril once a day (Patient taking differently: Place 2 sprays into both nostrils as needed for allergies or rhinitis.)  48 g 1   Fluticasone-Umeclidin-Vilant (TRELEGY ELLIPTA) 200-62.5-25 MCG/ACT AEPB Take 1 puff by mouth daily. 60 each 6   gabapentin (NEURONTIN) 100 MG capsule Take 2 capsules (200 mg total) by mouth 3 (three) times daily. 540 capsule 1   levocetirizine (XYZAL) 5 MG tablet Take 1 tablet (5 mg total) by mouth daily. 90 tablet 2   montelukast (SINGULAIR) 10 MG tablet Take 1 tablet (10 mg total) by mouth daily. 90 tablet 3   Multiple Vitamins-Minerals (MULTIVITAMIN ADULTS PO) Take 1 tablet by mouth daily.      Probiotic Product (RESTORA) CAPS TAKE 1 CAPSULE BY MOUTH DAILY. 90 capsule 1   tadalafil (CIALIS) 20 MG tablet Take 1 tablet by mouth as directed as needed 20  tablet 11   No current facility-administered medications for this visit.    Allergies as of 04/28/2023   (No Known Allergies)    Family History  Problem Relation Age of Onset   Diabetes Mother    Hypertension Mother    Stroke Mother 40   Heart failure Mother    Heart attack Father 63   CVA Sister    Diabetes Sister    Allergies Sister    CVA Sister    CVA Sister    Arthritis Sister    Hypertension Brother    Heart disease Brother    Diabetes Brother        2 brothers   Heart attack Brother 30       smoker   COPD Brother        died in his 23s   Cancer Neg Hx    Early death Neg Hx    Hyperlipidemia Neg Hx    Kidney disease Neg Hx    Colon cancer Neg Hx    Colon polyps Neg Hx    Esophageal cancer Neg Hx    Rectal cancer Neg Hx    Stomach cancer Neg Hx     Review of Systems:    Constitutional: No weight loss, fever, chills, weakness or fatigue HEENT: Eyes: No change in vision               Ears, Nose, Throat:  No change in hearing or congestion Skin: No rash or itching Cardiovascular: No chest pain, chest pressure or palpitations   Respiratory: No SOB or cough Gastrointestinal: See HPI and otherwise negative Genitourinary: No dysuria or change in urinary frequency Neurological: No headache, dizziness or syncope Musculoskeletal: No new muscle or joint pain Hematologic: No bleeding or bruising Psychiatric: No history of depression or anxiety    Physical Exam:  Vital signs: There were no vitals taken for this visit.  Constitutional:   Pleasant *** male appears to be in NAD, Well developed, Well nourished, alert and cooperative Head:  Normocephalic and atraumatic. Eyes:   PEERL, EOMI. No icterus. Conjunctiva pink. Ears:  Normal auditory acuity. Neck:  Supple Throat: Oral cavity and pharynx without inflammation, swelling or lesion.  Respiratory: Respirations even and unlabored. Lungs clear to auscultation bilaterally.   No wheezes, crackles, or rhonchi.   Cardiovascular: Normal S1, S2. Regular rate and rhythm. No peripheral edema, cyanosis or pallor.  Gastrointestinal:  Soft, nondistended, nontender. No rebound or guarding. Normal bowel sounds. No appreciable masses or hepatomegaly. Rectal:  Not performed.  Anoscopy: Msk:  Symmetrical without gross deformities. Without edema, no deformity or joint abnormality.  Neurologic:  Alert and  oriented x4;  grossly normal neurologically.  Skin:   Dry and intact without significant lesions  or rashes. Psychiatric: Oriented to person, place and time. Demonstrates good judgement and reason without abnormal affect or behaviors.  RELEVANT LABS AND IMAGING: CBC    Latest Ref Rng & Units 01/10/2023   10:19 AM 07/08/2022   10:25 AM 01/07/2022    9:52 AM  CBC  WBC 4.0 - 10.5 K/uL 6.0  5.7  5.8   Hemoglobin 13.0 - 17.0 g/dL 78.4  69.6  29.5   Hematocrit 39.0 - 52.0 % 48.5  45.1  48.5   Platelets 150.0 - 400.0 K/uL 214.0  197.0  192.0      CMP     Latest Ref Rng & Units 01/10/2023   10:19 AM 07/08/2022   10:25 AM 02/18/2022   10:52 AM  CMP  Glucose 70 - 99 mg/dL 284  98    BUN 6 - 23 mg/dL 17  17    Creatinine 1.32 - 1.50 mg/dL 4.40  1.02    Sodium 725 - 145 mEq/L 137  138    Potassium 3.5 - 5.1 mEq/L 4.0  4.0    Chloride 96 - 112 mEq/L 99  100    CO2 19 - 32 mEq/L 29  29    Calcium 8.4 - 10.5 mg/dL 9.3  9.1    Total Protein 6.0 - 8.3 g/dL 6.7   6.9   Total Bilirubin 0.2 - 1.2 mg/dL 0.6     Alkaline Phos 39 - 117 U/L 62     AST 0 - 37 U/L 25     ALT 0 - 53 U/L 25        Lab Results  Component Value Date   TSH 3.63 01/10/2023  02/28/23 echo-Left ventricular ejection fraction, by estimation, is 65 to 70%.  12/25/18 colonoscopy 12/13/2018 colonoscopy 11/24/2015 colonoscopy 03/18/2005 colonoscopy 04/11/1995 esophageal manometry 03/03/1995 PH 24 hour probe study    Assessment: 1. ***  Plan: 1. ***   Thank you for the courtesy of this consult. Please call me with any questions or concerns.    Jakwon Gayton, FNP-C [facility] 04/27/2023, 4:56 PM  Cc: Etta Grandchild, MD

## 2023-04-28 ENCOUNTER — Ambulatory Visit: Payer: Medicare PPO | Admitting: Gastroenterology

## 2023-04-28 DIAGNOSIS — R09A2 Foreign body sensation, throat: Secondary | ICD-10-CM

## 2023-04-28 DIAGNOSIS — R1310 Dysphagia, unspecified: Secondary | ICD-10-CM

## 2023-04-28 DIAGNOSIS — Z9889 Other specified postprocedural states: Secondary | ICD-10-CM

## 2023-04-28 NOTE — Patient Instructions (Signed)
 _______________________________________________________  If your blood pressure at your visit was 140/90 or greater, please contact your primary care physician to follow up on this.  If you are age 70 or younger, your body mass index should be between 19-25. Your Body mass index is 28.9 kg/m. If this is out of the aformentioned range listed, please consider follow up with your Primary Care Provider.  ________________________________________________________  The [facility] providers would like to encourage you to use [facility] to communicate with providers for non-urgent requests or questions.  Due to long hold times on the telephone, sending your provider a message by [facility] may be a faster and more efficient way to get a response.  Please allow 48 business hours for a response.  Please remember that this is for non-urgent requests.  _______________________________________________________  Bonita Quin have been scheduled for an endoscopy. Please follow written instructions given to you at your visit today.  If you use inhalers (even only as needed), please bring them with you on the day of your procedure.  If you take any of the following medications, they will need to be adjusted prior to your procedure:   DO NOT TAKE 7 DAYS PRIOR TO TEST- Trulicity (dulaglutide) Ozempic, Wegovy (semaglutide) Mounjaro (tirzepatide) Bydureon Bcise (exanatide extended release)  DO NOT TAKE 1 DAY PRIOR TO YOUR TEST Rybelsus (semaglutide) Adlyxin (lixisenatide) Victoza (liraglutide) Byetta (exanatide) ___________________________________________________________________________  Due to recent changes in healthcare laws, you may see the results of your imaging and laboratory studies on MyChart before your provider has had a chance to review them.  We understand that in some cases there may be results that are confusing or concerning to you. Not all laboratory results come back in the same time frame and the provider  may be waiting for multiple results in order to interpret others.  Please give [place] 48 hours in order for your provider to thoroughly review all the results before contacting the office for clarification of your results.   Thank you for entrusting me with your care and choosing [facility].  Deanna May

## 2023-05-02 DIAGNOSIS — G4733 Obstructive sleep apnea (adult) (pediatric): Secondary | ICD-10-CM | POA: Diagnosis not present

## 2023-05-03 DIAGNOSIS — J301 Allergic rhinitis due to pollen: Secondary | ICD-10-CM | POA: Diagnosis not present

## 2023-05-03 DIAGNOSIS — J3081 Allergic rhinitis due to animal (cat) (dog) hair and dander: Secondary | ICD-10-CM | POA: Diagnosis not present

## 2023-05-03 DIAGNOSIS — J3089 Other allergic rhinitis: Secondary | ICD-10-CM | POA: Diagnosis not present

## 2023-05-09 NOTE — Progress Notes (Signed)
 Addendum: Reviewed and agree with assessment and management plan. Asha Grumbine, Carie Caddy, MD

## 2023-05-10 DIAGNOSIS — J3089 Other allergic rhinitis: Secondary | ICD-10-CM | POA: Diagnosis not present

## 2023-05-10 DIAGNOSIS — J301 Allergic rhinitis due to pollen: Secondary | ICD-10-CM | POA: Diagnosis not present

## 2023-05-10 DIAGNOSIS — J3081 Allergic rhinitis due to animal (cat) (dog) hair and dander: Secondary | ICD-10-CM | POA: Diagnosis not present

## 2023-05-17 ENCOUNTER — Telehealth: Payer: Self-pay | Admitting: Internal Medicine

## 2023-05-17 NOTE — Telephone Encounter (Signed)
 Copied from CRM [id number]. Topic: Referral - Request for Referral >> May 17, 2023 11:14 AM Drema Balzarine wrote: Did the patient discuss referral with their provider in the last year? Yes (If No - schedule appointment) (If Yes - send message)  Appointment offered? No  Type of order/referral and detailed reason for visit: Echo showed heart failure  Preference of office, provider, location: [facility] - Dr.Bensimhon - [address]   If referral order, have you been seen by this specialty before? No (If Yes, this issue or another issue? When? Where?  Can we respond through MyChart? Yes, please message him when the referral is in.

## 2023-05-18 DIAGNOSIS — J301 Allergic rhinitis due to pollen: Secondary | ICD-10-CM | POA: Diagnosis not present

## 2023-05-18 DIAGNOSIS — J3081 Allergic rhinitis due to animal (cat) (dog) hair and dander: Secondary | ICD-10-CM | POA: Diagnosis not present

## 2023-05-18 DIAGNOSIS — J3089 Other allergic rhinitis: Secondary | ICD-10-CM | POA: Diagnosis not present

## 2023-05-19 DIAGNOSIS — G4733 Obstructive sleep apnea (adult) (pediatric): Secondary | ICD-10-CM | POA: Diagnosis not present

## 2023-05-20 NOTE — Telephone Encounter (Signed)
 Patient advised me that he has an appointment coming up with Dr. Yetta Barre and he will discuss this further then. I advised him that if he has any SOB or leg swelling to call us again and we will fit him on the schedule before that jun appointment and get this handled.

## 2023-05-25 DIAGNOSIS — J301 Allergic rhinitis due to pollen: Secondary | ICD-10-CM | POA: Diagnosis not present

## 2023-05-25 DIAGNOSIS — J3081 Allergic rhinitis due to animal (cat) (dog) hair and dander: Secondary | ICD-10-CM | POA: Diagnosis not present

## 2023-05-25 DIAGNOSIS — J3089 Other allergic rhinitis: Secondary | ICD-10-CM | POA: Diagnosis not present

## 2023-06-01 ENCOUNTER — Telehealth: Payer: Self-pay | Admitting: Neurology

## 2023-06-01 ENCOUNTER — Other Ambulatory Visit (HOSPITAL_COMMUNITY): Payer: Self-pay

## 2023-06-01 ENCOUNTER — Telehealth: Payer: Self-pay

## 2023-06-01 DIAGNOSIS — N529 Male erectile dysfunction, unspecified: Secondary | ICD-10-CM | POA: Diagnosis not present

## 2023-06-01 DIAGNOSIS — F5232 Male orgasmic disorder: Secondary | ICD-10-CM | POA: Diagnosis not present

## 2023-06-01 DIAGNOSIS — N481 Balanitis: Secondary | ICD-10-CM | POA: Diagnosis not present

## 2023-06-01 NOTE — Telephone Encounter (Signed)
 Pt. Needs assitance with decrease of dosage due to other Rx now taking

## 2023-06-01 NOTE — Telephone Encounter (Signed)
 Called patient he was wanting to reduse his Cymbalta  informed patient we do not prescribe that med and in fact think it could be causing his tremors

## 2023-06-01 NOTE — Telephone Encounter (Signed)
 Already called pateint to let him know that Dr. Winferd Hatter did not prescribe the Cymbalta  and he needed to call that Dr he just picked it up from Dr. Andy Bannister

## 2023-06-01 NOTE — Telephone Encounter (Signed)
 Pt called in and left a message with the after hours service. He stated Dr. Winferd Hatter put him on generic duloxetine  60 mg. He is having issues and the urologist suggested that he go off the medication. How would he go about stepping down?

## 2023-06-01 NOTE — Telephone Encounter (Signed)
 Copied from CRM [id number]. Topic: Clinical - Medication Question >> Jun 01, 2023  2:27 PM Allyne Areola wrote: Reason for CRM: Patient's urologist told patient he would need to stop DULoxetine  (CYMBALTA ) 60 MG capsule, patient knows that it needs to be cut off gradually and would like to know Dr.Jones recommendations.

## 2023-06-02 ENCOUNTER — Other Ambulatory Visit: Payer: Self-pay | Admitting: Internal Medicine

## 2023-06-02 ENCOUNTER — Other Ambulatory Visit (HOSPITAL_COMMUNITY): Payer: Self-pay

## 2023-06-02 DIAGNOSIS — L280 Lichen simplex chronicus: Secondary | ICD-10-CM

## 2023-06-02 DIAGNOSIS — F321 Major depressive disorder, single episode, moderate: Secondary | ICD-10-CM

## 2023-06-02 DIAGNOSIS — G4733 Obstructive sleep apnea (adult) (pediatric): Secondary | ICD-10-CM | POA: Diagnosis not present

## 2023-06-02 MED ORDER — DULOXETINE HCL 30 MG PO CPEP
30.0000 mg | ORAL_CAPSULE | Freq: Every day | ORAL | 0 refills | Status: DC
  Filled 2023-06-02: qty 30, 30d supply, fill #0

## 2023-06-02 NOTE — Telephone Encounter (Signed)
 Decrease to 30 mg QD

## 2023-06-02 NOTE — Telephone Encounter (Signed)
Unable to reach patient. Left message to return call.

## 2023-06-02 NOTE — Telephone Encounter (Signed)
**Note De-identified  [place] Obfuscation** Please advise 

## 2023-06-03 NOTE — Telephone Encounter (Signed)
 Copied from CRM [id number]. Topic: General - Other >> Jun 02, 2023  4:00 PM [place] A wrote: Reason for CRM: Patient returned call from nurse but at the time nurse was in with a patient-please call back

## 2023-06-03 NOTE — Telephone Encounter (Deleted)
 Copied from CRM [id number]. Topic: General - Other >> Jun 02, 2023  4:00 PM [place] A wrote: Reason for CRM: Patient returned call from nurse but at the time nurse was in with a patient-please call back

## 2023-06-06 NOTE — Telephone Encounter (Signed)
 Patient has been made aware of the medication change. He has also been made aware of Dr. Rochelle Chu comments.

## 2023-06-06 NOTE — Telephone Encounter (Signed)
 Unable to reach patient. LMTRC. Please try and reach me before just taking a message from this patient.

## 2023-06-08 ENCOUNTER — Encounter: Payer: Self-pay | Admitting: Internal Medicine

## 2023-06-08 DIAGNOSIS — J3081 Allergic rhinitis due to animal (cat) (dog) hair and dander: Secondary | ICD-10-CM | POA: Diagnosis not present

## 2023-06-08 DIAGNOSIS — J301 Allergic rhinitis due to pollen: Secondary | ICD-10-CM | POA: Diagnosis not present

## 2023-06-08 DIAGNOSIS — J3089 Other allergic rhinitis: Secondary | ICD-10-CM | POA: Diagnosis not present

## 2023-06-13 ENCOUNTER — Other Ambulatory Visit (HOSPITAL_BASED_OUTPATIENT_CLINIC_OR_DEPARTMENT_OTHER): Payer: Self-pay

## 2023-06-14 DIAGNOSIS — J301 Allergic rhinitis due to pollen: Secondary | ICD-10-CM | POA: Diagnosis not present

## 2023-06-14 DIAGNOSIS — J3089 Other allergic rhinitis: Secondary | ICD-10-CM | POA: Diagnosis not present

## 2023-06-14 DIAGNOSIS — J3081 Allergic rhinitis due to animal (cat) (dog) hair and dander: Secondary | ICD-10-CM | POA: Diagnosis not present

## 2023-06-15 ENCOUNTER — Encounter: Payer: Self-pay | Admitting: Internal Medicine

## 2023-06-15 ENCOUNTER — Ambulatory Visit: Admitting: Internal Medicine

## 2023-06-15 VITALS — BP 130/77 | HR 69 | Temp 97.7°F | Resp 14 | Ht 69.0 in | Wt 200.0 lb

## 2023-06-15 DIAGNOSIS — E785 Hyperlipidemia, unspecified: Secondary | ICD-10-CM | POA: Diagnosis not present

## 2023-06-15 DIAGNOSIS — R1310 Dysphagia, unspecified: Secondary | ICD-10-CM | POA: Diagnosis not present

## 2023-06-15 DIAGNOSIS — G4733 Obstructive sleep apnea (adult) (pediatric): Secondary | ICD-10-CM | POA: Diagnosis not present

## 2023-06-15 DIAGNOSIS — J45909 Unspecified asthma, uncomplicated: Secondary | ICD-10-CM | POA: Diagnosis not present

## 2023-06-15 DIAGNOSIS — K3189 Other diseases of stomach and duodenum: Secondary | ICD-10-CM | POA: Diagnosis not present

## 2023-06-15 DIAGNOSIS — I10 Essential (primary) hypertension: Secondary | ICD-10-CM | POA: Diagnosis not present

## 2023-06-15 DIAGNOSIS — Z9889 Other specified postprocedural states: Secondary | ICD-10-CM

## 2023-06-15 DIAGNOSIS — K2970 Gastritis, unspecified, without bleeding: Secondary | ICD-10-CM | POA: Diagnosis not present

## 2023-06-15 MED ORDER — SODIUM CHLORIDE 0.9 % IV SOLN
500.0000 mL | Freq: Once | INTRAVENOUS | Status: DC
Start: 2023-06-15 — End: 2023-06-15

## 2023-06-15 NOTE — Progress Notes (Signed)
 Pt's states no medical or surgical changes since previsit or office visit.

## 2023-06-15 NOTE — Progress Notes (Signed)
 GASTROENTEROLOGY PROCEDURE H&P NOTE   Primary Care Physician: Arcadio Knuckles, MD    Reason for Procedure:  Pill dysphagia, history of Nissen fundoplication  Plan:    EGD with possible dilation  Patient is appropriate for endoscopic procedure(s) in the ambulatory ([facility]) setting.  The nature of the procedure, as well as the risks, benefits, and alternatives were carefully and thoroughly reviewed with the patient. Ample time for discussion and questions allowed. The patient understood, was satisfied, and agreed to proceed.     HPI: Kenneth Sellers is a 68 y.o. male who presents for EGD.  Medical history as below.   No recent chest pain or shortness of breath.  No abdominal pain today.  Past Medical History:  Diagnosis Date   Abnormal EKG 01/2023   Allergic rhinitis    Dr. Almeda Jacobs   Allergy    Arthritis    hands   Asthma    Cataract    small immature left eye   Diverticulosis of colon    DJD (degenerative joint disease)    lower back   History of kidney stones 2020   x 1 passed on own   Hyperlipidemia    Hypertension    OSA (obstructive sleep apnea)    PSG 08/26/02 AHI 59--CPAP 7 cm   Phimosis 01/14/2021   Pneumonia 1988   treated as OP   Sleep apnea    wears cpap    Tachycardia 2003   Dr. Santiago Cuff, negative cardiac work up   Tremor of both hands     Past Surgical History:  Procedure Laterality Date   CIRCUMCISION N/A 01/19/2021   Procedure: CIRCUMCISION ADULT;  Surgeon: Trent Frizzle, MD;  Location: [facility];  Service: Urology;  Laterality: N/A;   colon tics  03/2005   [facility]   COLONOSCOPY  03/2005   colonscopy  12/25/2018   EYE SURGERY Right 2020   right ioc lens replacement for cataract   lesion removed L neck; pigmented mole at hairline, non melanoma  07/2003   NISSEN FUNDOPLICATION  1997    Dr Linell Rhymes; complicated by herniation post op with retching requiring 2nd surgery   POLYPECTOMY     RHINOPLASTY  1983    Prior to  Admission medications   Medication Sig Start Date End Date Taking? Authorizing Provider  aliskiren  (TEKTURNA ) 150 MG tablet Take 1 tablet (150 mg total) by mouth daily. 01/20/23 07/25/23 Yes Arcadio Knuckles, MD  AMBULATORY NON FORMULARY MEDICATION CPAP machine nightly   Yes [provider]  Desoximetasone  0.05 % GEL Apply topically. 08/04/12  Yes [provider]  DULoxetine  (CYMBALTA ) 30 MG capsule Take 1 capsule (30 mg total) by mouth daily. 06/02/23  Yes Arcadio Knuckles, MD  ezetimibe -simvastatin  (VYTORIN ) 10-20 MG tablet Take 1 tablet by mouth at bedtime. 01/20/23 09/07/23 Yes Arcadio Knuckles, MD  Fluticasone -Umeclidin-Vilant (TRELEGY ELLIPTA ) 200-62.5-25 MCG/ACT AEPB Take 1 puff by mouth daily. 11/29/22  Yes   gabapentin  (NEURONTIN ) 100 MG capsule Take 2 capsules (200 mg total) by mouth 3 (three) times daily. Patient taking differently: Take 200 mg by mouth 2 (two) times daily. 200 mg in the AM and 300 mg qhs 01/20/23  Yes Arcadio Knuckles, MD  levocetirizine (XYZAL ) 5 MG tablet Take 1 tablet (5 mg total) by mouth daily. 10/22/22  Yes   Multiple Vitamins-Minerals (MULTIVITAMIN ADULTS PO) Take 1 tablet by mouth daily.    Yes [provider]  Probiotic Product (RESTORA) CAPS TAKE 1 CAPSULE  BY MOUTH DAILY. 12/15/18  Yes Arcadio Knuckles, MD  tadalafil  (CIALIS ) 20 MG tablet Take 1 tablet by mouth as directed as needed 02/22/23  Yes   albuterol  (VENTOLIN  HFA) 108 (90 Base) MCG/ACT inhaler Inhale 1-2 puffs into the lungs every 4-6 hours as needed for cough/wheeze. 07/26/22     azelastine  (ASTELIN ) 0.1 % nasal spray Place 2 sprays into both nostrils 2 (two) times daily. Use in each nostril as directed Patient taking differently: Place 2 sprays into both nostrils as needed for rhinitis or allergies. Use in each nostril as directed 11/16/21   Cobb, Mariah Shines, NP  EPINEPHrine  0.3 mg/0.3 mL IJ SOAJ injection Inject as directed as needed 04/09/22     fluticasone  (FLONASE ) 50 MCG/ACT nasal  spray Place 1 - 2 sprays in each nostril once a day Patient taking differently: Place 2 sprays into both nostrils as needed for allergies or rhinitis. 01/28/21     montelukast  (SINGULAIR ) 10 MG tablet Take 1 tablet (10 mg total) by mouth daily. Patient not taking: Reported on 06/15/2023 07/26/22       Current Outpatient Medications  Medication Sig Dispense Refill   aliskiren  (TEKTURNA ) 150 MG tablet Take 1 tablet (150 mg total) by mouth daily. 90 tablet 1   AMBULATORY NON FORMULARY MEDICATION CPAP machine nightly     Desoximetasone  0.05 % GEL Apply topically.     DULoxetine  (CYMBALTA ) 30 MG capsule Take 1 capsule (30 mg total) by mouth daily. 30 capsule 0   ezetimibe -simvastatin  (VYTORIN ) 10-20 MG tablet Take 1 tablet by mouth at bedtime. 90 tablet 1   Fluticasone -Umeclidin-Vilant (TRELEGY ELLIPTA ) 200-62.5-25 MCG/ACT AEPB Take 1 puff by mouth daily. 60 each 6   gabapentin  (NEURONTIN ) 100 MG capsule Take 2 capsules (200 mg total) by mouth 3 (three) times daily. (Patient taking differently: Take 200 mg by mouth 2 (two) times daily. 200 mg in the AM and 300 mg qhs) 540 capsule 1   levocetirizine (XYZAL ) 5 MG tablet Take 1 tablet (5 mg total) by mouth daily. 90 tablet 2   Multiple Vitamins-Minerals (MULTIVITAMIN ADULTS PO) Take 1 tablet by mouth daily.      Probiotic Product (RESTORA) CAPS TAKE 1 CAPSULE BY MOUTH DAILY. 90 capsule 1   tadalafil  (CIALIS ) 20 MG tablet Take 1 tablet by mouth as directed as needed 20 tablet 11   albuterol  (VENTOLIN  HFA) 108 (90 Base) MCG/ACT inhaler Inhale 1-2 puffs into the lungs every 4-6 hours as needed for cough/wheeze. 6.7 g 0   azelastine  (ASTELIN ) 0.1 % nasal spray Place 2 sprays into both nostrils 2 (two) times daily. Use in each nostril as directed (Patient taking differently: Place 2 sprays into both nostrils as needed for rhinitis or allergies. Use in each nostril as directed) 30 mL 5   EPINEPHrine  0.3 mg/0.3 mL IJ SOAJ injection Inject as directed as needed 60  each 1   fluticasone  (FLONASE ) 50 MCG/ACT nasal spray Place 1 - 2 sprays in each nostril once a day (Patient taking differently: Place 2 sprays into both nostrils as needed for allergies or rhinitis.) 48 g 1   montelukast  (SINGULAIR ) 10 MG tablet Take 1 tablet (10 mg total) by mouth daily. (Patient not taking: Reported on 06/15/2023) 90 tablet 3   Current Facility-Administered Medications  Medication Dose Route Frequency Provider Last Rate Last Admin   0.9 %  sodium chloride  infusion  500 mL Intravenous Once Kenneisha Cochrane, Amber Bail, MD        Allergies as of 06/15/2023   (  No Known Allergies)    Family History  Problem Relation Age of Onset   Diabetes Mother    Hypertension Mother    Stroke Mother 66   Heart failure Mother    Heart attack Father 31   CVA Sister    Diabetes Sister    Allergies Sister    CVA Sister    CVA Sister    Arthritis Sister    Hypertension Brother    Heart disease Brother    Diabetes Brother        2 brothers   Heart attack Brother 67       smoker   COPD Brother        died in his 40s   Cancer Neg Hx    Early death Neg Hx    Hyperlipidemia Neg Hx    Kidney disease Neg Hx    Colon cancer Neg Hx    Colon polyps Neg Hx    Esophageal cancer Neg Hx    Rectal cancer Neg Hx    Stomach cancer Neg Hx     Social History   Socioeconomic History   Marital status: Married    Spouse name: Norma Beckers   Number of children: 2   Years of education: Not on file   Highest education level: Bachelor's degree (e.g., BA, AB, BS)  Occupational History   Occupation: retired    Comment: Runner, broadcasting/film/video  Tobacco Use   Smoking status: Never   Smokeless tobacco: Never  Vaping Use   Vaping status: Never Used  Substance and Sexual Activity   Alcohol use: Yes    Alcohol/week: 2.0 standard drinks of alcohol    Types: 2 Cans of beer per week    Comment: occasionally   Drug use: No   Sexual activity: Yes  Other Topics Concern   Not on file  Social History Narrative   Lives with  wife-2025   Social Drivers of Health   Financial Resource Strain: Low Risk  (02/28/2023)   Overall Financial Resource Strain (CARDIA)    Difficulty of Paying Living Expenses: Not hard at all  Food Insecurity: Low Risk  (03/23/2023)   Received from [facility]   Hunger Vital Sign    Worried About Running Out of Food in the Last Year: Never true    Ran Out of Food in the Last Year: Never true  Transportation Needs: No Transportation Needs (03/23/2023)   Received from [employer]    In the past 12 months, has lack of reliable transportation kept you from medical appointments, meetings, work or from getting things needed for daily living? : No  Physical Activity: Unknown (02/28/2023)   Exercise Vital Sign    Days of Exercise per Week: 0 days    Minutes of Exercise per Session: Not on file  Stress: Stress Concern Present (02/28/2023)   [employer] of Occupational Health - Occupational Stress Questionnaire    Feeling of Stress : To some extent  Social Connections: Socially Integrated (02/28/2023)   Social Connection and Isolation Panel [NHANES]    Frequency of Communication with Friends and Family: More than three times a week    Frequency of Social Gatherings with Friends and Family: Once a week    Attends Religious Services: More than 4 times per year    Active Member of [employer] or Organizations: Yes    Attends Engineer, structural: More than 4 times per year    Marital Status: Married  Catering manager  Violence: Not At Risk (03/02/2023)   Humiliation, Afraid, Rape, and Kick questionnaire    Fear of Current or Ex-Partner: No    Emotionally Abused: No    Physically Abused: No    Sexually Abused: No    Physical Exam: Vital signs in last 24 hours: @BP  137/87   Pulse 87   Temp 97.7 F (36.5 C)   Ht 5\' 9"  (1.753 m)   Wt 200 lb (90.7 kg)   SpO2 98%   BMI 29.53 kg/m  GEN: NAD EYE: Sclerae anicteric ENT: MMM CV: Non-tachycardic Pulm: CTA b/l GI:  Soft, NT/ND NEURO:  Alert & Oriented x 3   Laurell Pond, MD [facility]  06/15/2023 10:20 AM

## 2023-06-15 NOTE — Op Note (Signed)
 [facility] Patient Name: Kenneth Sellers Procedure Date: 06/15/2023 10:22 AM MRN: [id number] Endoscopist: Nannette Babe , MD, [id number] Age: 68 Referring MD:  Date of Birth: 06/22/1955 Gender: Male Account #: [id number] Procedure:                Upper GI endoscopy Indications:              Dysphagia, hx of Nissen fundoplication Medicines:                Monitored Anesthesia Care Procedure:                Pre-Anesthesia Assessment:                           - Prior to the procedure, a History and Physical                            was performed, and patient medications and                            allergies were reviewed. The patient's tolerance of                            previous anesthesia was also reviewed. The risks                            and benefits of the procedure and the sedation                            options and risks were discussed with the patient.                            All questions were answered, and informed consent                            was obtained. Prior Anticoagulants: The patient has                            taken no anticoagulant or antiplatelet agents. ASA                            Grade Assessment: III - A patient with severe                            systemic disease. After reviewing the risks and                            benefits, the patient was deemed in satisfactory                            condition to undergo the procedure.                           After obtaining informed consent, the endoscope was  passed under direct vision. Throughout the                            procedure, the patient's blood pressure, pulse, and                            oxygen saturations were monitored continuously. The                            GIF [id number] was introduced through the                            mouth, and advanced to the second part of duodenum.                            The upper GI  endoscopy was accomplished without                            difficulty. The patient tolerated the procedure                            well. Scope In: Scope Out: Findings:                 Normal mucosa was found in the entire esophagus.                           Evidence of a Nissen fundoplication was found at                            the gastroesophageal junction. The wrap appeared                            intact. A TTS dilator was passed through the scope.                            Dilation with a 15-16.5-18 mm balloon dilator was                            performed to 18 mm.                           Scattered mild inflammation characterized by                            erythema was found in the gastric body and in the                            gastric antrum. Biopsies were taken with a cold                            forceps for Helicobacter pylori testing.                           The examined  duodenum was normal. Complications:            No immediate complications. Estimated Blood Loss:     Estimated blood loss: none. Impression:               - Normal mucosa was found in the entire esophagus.                           - A Nissen fundoplication was found. The wrap                            appears intact. Dilated in distal esophagus and                            across the GE junction to 18 mm.                           - Gastritis. Biopsied.                           - Normal examined duodenum. Recommendation:           - Patient has a contact number available for                            emergencies. The signs and symptoms of potential                            delayed complications were discussed with the                            patient. Return to normal activities tomorrow.                            Written discharge instructions were provided to the                            patient.                           - Resume previous diet.                            - Continue present medications.                           - Await pathology results.                           - If persistent trouble swallowing after dilation                            proceed with barium esophagram with tablet. Nannette Babe, MD 06/15/2023 10:57:46 AM This report has been signed electronically.

## 2023-06-15 NOTE — Progress Notes (Signed)
 A/o x 3, VSS, gd SR's, pleased with anesthesia, report to RN

## 2023-06-15 NOTE — Progress Notes (Signed)
 Called to room to assist during endoscopic procedure.  Patient ID and intended procedure confirmed with present staff. Received instructions for my participation in the procedure from the performing physician.

## 2023-06-15 NOTE — Patient Instructions (Signed)
Please read handouts provided. Continue present medications. Resume previous diet. Await pathology results.  YOU HAD AN ENDOSCOPIC PROCEDURE TODAY AT [facility]:   Refer to the procedure report that was given to you for any specific questions about what was found during the examination.  If the procedure report does not answer your questions, please call your gastroenterologist to clarify.  If you requested that your care partner not be given the details of your procedure findings, then the procedure report has been included in a sealed envelope for you to review at your convenience later.  YOU SHOULD EXPECT: Some feelings of bloating in the abdomen. Passage of more gas than usual.  Walking can help get rid of the air that was put into your GI tract during the procedure and reduce the bloating. If you had a lower endoscopy (such as a colonoscopy or flexible sigmoidoscopy) you may notice spotting of blood in your stool or on the toilet paper. If you underwent a bowel prep for your procedure, you may not have a normal bowel movement for a few days.  Please Note:  You might notice some irritation and congestion in your nose or some drainage.  This is from the oxygen used during your procedure.  There is no need for concern and it should clear up in a day or so.  SYMPTOMS TO REPORT IMMEDIATELY:   Following upper endoscopy (EGD)  Vomiting of blood or coffee ground material  New chest pain or pain under the shoulder blades  Painful or persistently difficult swallowing  New shortness of breath  Fever of 100F or higher  Black, tarry-looking stools  For urgent or emergent issues, a gastroenterologist can be reached at any hour by calling [phone]. Do not use MyChart messaging for urgent concerns.    DIET:  We do recommend a small meal at first, but then you may proceed to your regular diet.  Drink plenty of fluids but you should avoid alcoholic beverages for 24  hours.  ACTIVITY:  You should plan to take it easy for the rest of today and you should NOT DRIVE or use heavy machinery until tomorrow (because of the sedation medicines used during the test).    FOLLOW UP: Our staff will call the number listed on your records the next business day following your procedure.  We will call around 7:15- 8:00 am to check on you and address any questions or concerns that you may have regarding the information given to you following your procedure. If we do not reach you, we will leave a message.     If any biopsies were taken you will be contacted by phone or by letter within the next 1-3 weeks.  Please call us at [phone] if you have not heard about the biopsies in 3 weeks.    SIGNATURES/CONFIDENTIALITY: You and/or your care partner have signed paperwork which will be entered into your electronic medical record.  These signatures attest to the fact that that the information above on your After Visit Summary has been reviewed and is understood.  Full responsibility of the confidentiality of this discharge information lies with you and/or your care-partner.

## 2023-06-16 ENCOUNTER — Telehealth: Payer: Self-pay

## 2023-06-16 NOTE — Telephone Encounter (Signed)
 Left message on follow up call.

## 2023-06-17 LAB — SURGICAL PATHOLOGY

## 2023-06-20 ENCOUNTER — Encounter: Payer: Self-pay | Admitting: Internal Medicine

## 2023-06-21 DIAGNOSIS — J301 Allergic rhinitis due to pollen: Secondary | ICD-10-CM | POA: Diagnosis not present

## 2023-06-21 DIAGNOSIS — J3089 Other allergic rhinitis: Secondary | ICD-10-CM | POA: Diagnosis not present

## 2023-06-21 DIAGNOSIS — J3081 Allergic rhinitis due to animal (cat) (dog) hair and dander: Secondary | ICD-10-CM | POA: Diagnosis not present

## 2023-06-28 DIAGNOSIS — J3089 Other allergic rhinitis: Secondary | ICD-10-CM | POA: Diagnosis not present

## 2023-06-28 DIAGNOSIS — J301 Allergic rhinitis due to pollen: Secondary | ICD-10-CM | POA: Diagnosis not present

## 2023-06-28 DIAGNOSIS — J3081 Allergic rhinitis due to animal (cat) (dog) hair and dander: Secondary | ICD-10-CM | POA: Diagnosis not present

## 2023-07-02 ENCOUNTER — Other Ambulatory Visit (HOSPITAL_COMMUNITY): Payer: Self-pay

## 2023-07-02 DIAGNOSIS — G4733 Obstructive sleep apnea (adult) (pediatric): Secondary | ICD-10-CM | POA: Diagnosis not present

## 2023-07-03 DIAGNOSIS — G4733 Obstructive sleep apnea (adult) (pediatric): Secondary | ICD-10-CM | POA: Diagnosis not present

## 2023-07-05 ENCOUNTER — Other Ambulatory Visit (HOSPITAL_BASED_OUTPATIENT_CLINIC_OR_DEPARTMENT_OTHER): Payer: Self-pay

## 2023-07-05 ENCOUNTER — Other Ambulatory Visit (HOSPITAL_COMMUNITY): Payer: Self-pay

## 2023-07-05 ENCOUNTER — Other Ambulatory Visit: Payer: Self-pay | Admitting: Internal Medicine

## 2023-07-05 DIAGNOSIS — E785 Hyperlipidemia, unspecified: Secondary | ICD-10-CM

## 2023-07-05 DIAGNOSIS — I10 Essential (primary) hypertension: Secondary | ICD-10-CM

## 2023-07-05 DIAGNOSIS — J3089 Other allergic rhinitis: Secondary | ICD-10-CM | POA: Diagnosis not present

## 2023-07-05 DIAGNOSIS — J3081 Allergic rhinitis due to animal (cat) (dog) hair and dander: Secondary | ICD-10-CM | POA: Diagnosis not present

## 2023-07-05 DIAGNOSIS — J301 Allergic rhinitis due to pollen: Secondary | ICD-10-CM | POA: Diagnosis not present

## 2023-07-05 MED ORDER — EZETIMIBE-SIMVASTATIN 10-20 MG PO TABS
1.0000 | ORAL_TABLET | Freq: Every day | ORAL | 1 refills | Status: DC
  Filled 2023-07-05 – 2023-09-06 (×2): qty 90, 90d supply, fill #0
  Filled 2023-12-01: qty 90, 90d supply, fill #1

## 2023-07-05 MED ORDER — LEVOCETIRIZINE DIHYDROCHLORIDE 5 MG PO TABS
5.0000 mg | ORAL_TABLET | Freq: Every day | ORAL | 2 refills | Status: AC
  Filled 2023-07-05 – 2023-07-06 (×2): qty 90, 90d supply, fill #0
  Filled 2023-10-16: qty 90, 90d supply, fill #1
  Filled 2024-01-13: qty 90, 90d supply, fill #2

## 2023-07-05 MED ORDER — TRELEGY ELLIPTA 200-62.5-25 MCG/ACT IN AEPB
1.0000 | INHALATION_SPRAY | Freq: Every day | RESPIRATORY_TRACT | 6 refills | Status: DC
  Filled 2023-07-05 (×2): qty 60, 30d supply, fill #0
  Filled 2023-08-01: qty 60, 30d supply, fill #1
  Filled 2023-09-06: qty 60, 30d supply, fill #2
  Filled 2023-10-04: qty 60, 30d supply, fill #3
  Filled 2023-11-07: qty 60, 30d supply, fill #4
  Filled 2023-12-05: qty 60, 30d supply, fill #5
  Filled 2024-01-03: qty 60, 30d supply, fill #6

## 2023-07-05 MED ORDER — MONTELUKAST SODIUM 10 MG PO TABS
10.0000 mg | ORAL_TABLET | Freq: Every day | ORAL | 2 refills | Status: AC
  Filled 2023-07-05 – 2023-07-06 (×2): qty 90, 90d supply, fill #0
  Filled 2023-10-16: qty 90, 90d supply, fill #1
  Filled 2024-01-13: qty 90, 90d supply, fill #2

## 2023-07-05 MED ORDER — ALISKIREN FUMARATE 150 MG PO TABS
150.0000 mg | ORAL_TABLET | Freq: Every day | ORAL | 1 refills | Status: DC
  Filled 2023-07-05 – 2023-08-01 (×2): qty 90, 90d supply, fill #0
  Filled 2023-10-27: qty 90, 90d supply, fill #1

## 2023-07-06 ENCOUNTER — Other Ambulatory Visit (HOSPITAL_BASED_OUTPATIENT_CLINIC_OR_DEPARTMENT_OTHER): Payer: Self-pay

## 2023-07-06 MED ORDER — CAPVAXIVE 0.5 ML IM SOSY
PREFILLED_SYRINGE | INTRAMUSCULAR | 0 refills | Status: DC
  Filled 2023-07-06: qty 0.5, 1d supply, fill #0

## 2023-07-07 DIAGNOSIS — D3131 Benign neoplasm of right choroid: Secondary | ICD-10-CM | POA: Diagnosis not present

## 2023-07-07 DIAGNOSIS — H2512 Age-related nuclear cataract, left eye: Secondary | ICD-10-CM | POA: Diagnosis not present

## 2023-07-08 DIAGNOSIS — D225 Melanocytic nevi of trunk: Secondary | ICD-10-CM | POA: Diagnosis not present

## 2023-07-08 DIAGNOSIS — Z1283 Encounter for screening for malignant neoplasm of skin: Secondary | ICD-10-CM | POA: Diagnosis not present

## 2023-07-12 DIAGNOSIS — J301 Allergic rhinitis due to pollen: Secondary | ICD-10-CM | POA: Diagnosis not present

## 2023-07-12 DIAGNOSIS — J3089 Other allergic rhinitis: Secondary | ICD-10-CM | POA: Diagnosis not present

## 2023-07-12 DIAGNOSIS — J3081 Allergic rhinitis due to animal (cat) (dog) hair and dander: Secondary | ICD-10-CM | POA: Diagnosis not present

## 2023-07-15 ENCOUNTER — Ambulatory Visit: Attending: Internal Medicine

## 2023-07-15 ENCOUNTER — Ambulatory Visit: Admitting: Internal Medicine

## 2023-07-15 ENCOUNTER — Encounter: Payer: Self-pay | Admitting: Internal Medicine

## 2023-07-15 VITALS — BP 144/86 | HR 78 | Temp 98.4°F | Resp 16 | Ht 69.0 in | Wt 198.4 lb

## 2023-07-15 DIAGNOSIS — R0609 Other forms of dyspnea: Secondary | ICD-10-CM | POA: Insufficient documentation

## 2023-07-15 DIAGNOSIS — R002 Palpitations: Secondary | ICD-10-CM | POA: Insufficient documentation

## 2023-07-15 DIAGNOSIS — I10 Essential (primary) hypertension: Secondary | ICD-10-CM

## 2023-07-15 DIAGNOSIS — Z113 Encounter for screening for infections with a predominantly sexual mode of transmission: Secondary | ICD-10-CM | POA: Insufficient documentation

## 2023-07-15 DIAGNOSIS — R7303 Prediabetes: Secondary | ICD-10-CM | POA: Diagnosis not present

## 2023-07-15 DIAGNOSIS — I5189 Other ill-defined heart diseases: Secondary | ICD-10-CM | POA: Diagnosis not present

## 2023-07-15 NOTE — Patient Instructions (Signed)
 Heart Failure: How to Prevent It Heart failure is a condition where the heart has trouble pumping blood. This may mean that the heart can't pump enough blood out to the body or that the heart doesn't fill up with enough blood. These problems can lead to symptoms like tiredness, trouble breathing, and swelling in the body. Heart failure is a common medical condition that affects the whole body, not just the heart. By making changes to your diet and lifestyle, you can help prevent heart failure and avoid serious health problems. How can this condition affect me? Heart failure can cause some serious problems that may get worse over time. These may include: Feeling tired during normal physical activities. Trouble breathing. Swelling in your legs, ankles, or feet. Weight gain. A cough. What can increase my risk? The risk of heart failure increases as a person gets older. The most common causes are high blood pressure and blocked blood vessels known as coronary artery disease.  Other things that may make you more likely to get heart failure include: Having any of these medical conditions: Heart valve problems. Diabetes. Thyroid  problems. Heart rhythm problems. Low blood counts. This is called anemia. Lung disease. Chronic kidney disease. Using tobacco or nicotine products. Using alcohol or other substances. Taking medicines that can damage the heart, like chemotherapy drugs. Having a family history of heart failure. Being very overweight or obese. Infections caused by viruses. What actions can I take to prevent heart failure? Eating and drinking  If you're overweight or obese, talk with your health care provider about how many calories you should have each day to help you lose weight. Eat foods that are low in salt (sodium) and avoid adding extra salt to foods. Eat a balanced diet that includes: Fresh fruits and vegetables. Whole grains. Lean protein. Beans. Fat-free or low-fat dairy  products. Seafood 1-2 days a week. Avoid foods that have a lot of: Trans fats. Saturated fats. Sugar. Cholesterol. You may want to work with an expert in healthy eating called a dietitian. Alcohol Do not drink alcohol if: Your provider tells you not to drink. You're pregnant, may be pregnant, or plan to become pregnant. If you drink alcohol: Limit how much you have to: 0-1 drink a day if you're male. 0-2 drinks a day if you're male. Know how much alcohol is in your drink. In the [place] one drink is one 12 oz bottle of beer (355 mL), one 5 oz glass of wine (148 mL), or one 1 oz glass of hard liquor (44 mL). Lifestyle  Do not smoke, vape, or use nicotine or tobacco. Exercise for at least 30 minutes, 5 days each week, or as much as told by your provider. Do moderate-intensity exercise, like brisk walking, biking, or water aerobics. Ask your provider which activities are safe for you. Try to get 7-9 hours of sleep each night. To help with sleep: Keep your bedroom cool and dark. Do not eat a heavy meal during the hour before you go to bed. Do not drink alcohol or drinks with caffeine before bed. Avoid screentime before bedtime. This means avoiding TV, computers, tablets, and mobile phones. Find ways to relax and manage stress. These may include: Breathing exercises. Meditation. Yoga. Listening to music. General instructions See a provider regularly for screening and wellness checks. Work with your provider to manage your: Blood pressure. Cholesterol levels. Blood sugar, also called glucose levels. Weight. Where to find more information National Heart, Lung, and Blood Institute: [place]DryCleaner.gl National Institute  on Aging: BaseRingTones.pl American Heart Association: heart.org Contact a health care provider if: You're gaining weight quickly. You have increasing shortness of breath that's unusual for you. You tire easily or you're not able to do your usual activities. You cough  more than normal, especially when doing physical activity. You have swelling, or more swelling, in areas such as your hands, feet, or ankles. You have fast or irregular heartbeats (palpitations). Get help right away if: You have trouble breathing. You have pain or discomfort in your chest. You faint. These symptoms may be an emergency. Call 911 right away. Do not wait to see if the symptoms will go away. Do not drive yourself to the hospital. This information is not intended to replace advice given to you by your health care provider. Make sure you discuss any questions you have with your health care provider. Document Revised: 07/02/2022 Document Reviewed: 07/02/2022 Elsevier Patient Education  2025 [employer].

## 2023-07-15 NOTE — Progress Notes (Unsigned)
 Subjective:  Patient ID: Kenneth Sellers, male    DOB: 12-Jul-1955  Age: 68 y.o. MRN: [id number]  CC: Hypertension   HPI Kenneth Sellers presents for f/up ----  Discussed the use of AI scribe software for clinical note transcription with the patient, who gave verbal consent to proceed.  History of Present Illness   Kenneth Sellers is a 68 year old male with a history of arrhythmia who presents with concerns about heart failure symptoms and medication side effects.  He discontinued Cymbalta  two weeks ago due to difficulty achieving climax, as advised by his urologist. Since stopping the medication, he has noticed an improvement in his ability to climax without significant changes in mood or mental state. No increase in depression or anxiety symptoms since discontinuation.  He experiences shortness of breath during physical activity, particularly on inclines, and is uncertain if it is related to asthma or another cause. He denies associated chest pain or cold sweats. He has a history of arrhythmia, which previously required hospitalization. Recently, he has noticed occasional extra beats and a faster heart rate during physical activity, such as yard work, with the last episode of irregular heartbeats occurring about a week ago. He has had occasional leg swelling but denies any current swelling.  His wife is concerned about his heart health and mentioned a previous echocardiogram that showed mild heart failure. He has not yet consulted a cardiologist.       Outpatient Medications Prior to Visit  Medication Sig Dispense Refill   albuterol  (VENTOLIN  HFA) 108 (90 Base) MCG/ACT inhaler Inhale 1-2 puffs into the lungs every 4-6 hours as needed for cough/wheeze. 6.7 g 0   aliskiren  (TEKTURNA ) 150 MG tablet Take 1 tablet (150 mg total) by mouth daily. 90 tablet 1   AMBULATORY NON FORMULARY MEDICATION CPAP machine nightly     azelastine  (ASTELIN ) 0.1 % nasal spray Place 2 sprays into both nostrils 2 (two)  times daily. Use in each nostril as directed (Patient taking differently: Place 2 sprays into both nostrils as needed for rhinitis or allergies. Use in each nostril as directed) 30 mL 5   Desoximetasone  0.05 % GEL Apply topically.     EPINEPHrine  0.3 mg/0.3 mL IJ SOAJ injection Inject as directed as needed 60 each 1   ezetimibe -simvastatin  (VYTORIN ) 10-20 MG tablet Take 1 tablet by mouth at bedtime. 90 tablet 1   fluticasone  (FLONASE ) 50 MCG/ACT nasal spray Place 1 - 2 sprays in each nostril once a day (Patient taking differently: Place 2 sprays into both nostrils as needed for allergies or rhinitis.) 48 g 1   Fluticasone -Umeclidin-Vilant (TRELEGY ELLIPTA ) 200-62.5-25 MCG/ACT AEPB Inhale 1 puff into the lungs daily. 60 each 6   gabapentin  (NEURONTIN ) 100 MG capsule Take 2 capsules (200 mg total) by mouth 3 (three) times daily. (Patient taking differently: Take 200 mg by mouth 2 (two) times daily. 200 mg in the AM and 300 mg qhs) 540 capsule 1   levocetirizine (XYZAL ) 5 MG tablet Take 1 tablet (5 mg total) by mouth daily. 90 tablet 2   montelukast  (SINGULAIR ) 10 MG tablet Take 1 tablet (10 mg total) by mouth daily. 90 tablet 2   Multiple Vitamins-Minerals (MULTIVITAMIN ADULTS PO) Take 1 tablet by mouth daily.      Probiotic Product (RESTORA) CAPS TAKE 1 CAPSULE BY MOUTH DAILY. 90 capsule 1   tadalafil  (CIALIS ) 20 MG tablet Take 1 tablet by mouth as directed as needed 20 tablet 11   pneumococcal 21-valent  conjugate vaccine (CAPVAXIVE) 0.5 ML injection Inject into the muscle. 0.5 mL 0   DULoxetine  (CYMBALTA ) 30 MG capsule Take 1 capsule (30 mg total) by mouth daily. 30 capsule 0   No facility-administered medications prior to visit.    ROS Review of Systems  Objective:  BP (!) 144/86 (BP Location: Left Arm, Patient Position: Sitting, Cuff Size: Normal)   Pulse 78   Temp 98.4 F (36.9 C) (Oral)   Resp 16   Ht 5\' 9"  (1.753 m)   Wt 198 lb 6.4 oz (90 kg)   SpO2 98%   BMI 29.30 kg/m   BP  Readings from Last 3 Encounters:  07/15/23 (!) 144/86  06/15/23 130/77  04/28/23 118/68    Wt Readings from Last 3 Encounters:  07/15/23 198 lb 6.4 oz (90 kg)  06/15/23 200 lb (90.7 kg)  04/28/23 200 lb (90.7 kg)    Physical Exam Vitals reviewed.  Constitutional:      Appearance: Normal appearance.  HENT:     Nose: Nose normal.     Mouth/Throat:     Mouth: Mucous membranes are moist.  Eyes:     General: No scleral icterus.    Conjunctiva/sclera: Conjunctivae normal.  Cardiovascular:     Rate and Rhythm: Normal rate and regular rhythm.     Heart sounds: No murmur heard.    No friction rub. No gallop.     Comments: EKG- NSR, 71 bpm LAFB No LVH, Q waves, or ST/T wave changes  Unchanged  Pulmonary:     Effort: Pulmonary effort is normal.     Breath sounds: No stridor. No wheezing, rhonchi or rales.  Abdominal:     General: Abdomen is flat.     Palpations: There is no mass.     Tenderness: There is no abdominal tenderness. There is no guarding.     Hernia: No hernia is present.  Musculoskeletal:        General: Normal range of motion.     Cervical back: Neck supple.     Right lower leg: No edema.     Left lower leg: No edema.  Lymphadenopathy:     Cervical: No cervical adenopathy.  Skin:    General: Skin is warm and dry.  Neurological:     General: No focal deficit present.     Mental Status: He is alert. Mental status is at baseline.  Psychiatric:        Mood and Affect: Mood normal.        Behavior: Behavior normal.     Lab Results  Component Value Date   WBC 6.0 01/10/2023   HGB 16.3 01/10/2023   HCT 48.5 01/10/2023   PLT 214.0 01/10/2023   GLUCOSE 107 (H) 07/18/2023   CHOL 175 01/10/2023   TRIG 96.0 01/10/2023   HDL 56.20 01/10/2023   LDLCALC 100 (H) 01/10/2023   ALT 25 01/10/2023   AST 25 01/10/2023   NA 138 07/18/2023   K 4.0 07/18/2023   CL 102 07/18/2023   CREATININE 0.93 07/18/2023   BUN 12 07/18/2023   CO2 26 07/18/2023   TSH 3.63  01/10/2023   PSA 0.35 01/10/2023   HGBA1C 6.1 07/18/2023    NM BRAIN DATSCAN  TUMOR LOC INFLAM SPECT 1 DAY Result Date: 03/23/2022 CLINICAL DATA:  68 year old male with longstanding tremor. Increasing intensity. EXAM: NUCLEAR MEDICINE BRAIN IMAGING WITH SPECT  (DaTscan  ) TECHNIQUE: SPECT images of the brain were obtained after intravenous injection of radiopharmaceutical. 4 hour post injection  imaging. Appropriate positioning. 130 mg IO STAT given orally for thyroid  blockade. RADIOPHARMACEUTICALS:  4.6 millicuries I 123 Ioflupane COMPARISON:  None Available. FINDINGS: Normal, shape of the LEFT and RIGHT striata. There is greater relative activity in the LEFT striatum compared to the RIGHT. No focal deficit identified. IMPRESSION: Mild asymmetry of activity within the striata. Findings within normal limits. Of note, DaTSCAN  is not diagnostic of Parkinsonian syndromes, which remains a clinical diagnosis. DaTscan  is an adjuvant test to aid in the clinical diagnosis of Parkinsonian syndromes. Electronically Signed   By: Deboraha Fallow M.D.   On: 03/23/2022 16:12    Assessment & Plan:  Primary hypertension -     Basic metabolic panel with GFR; Future -     Urinalysis, Routine w reflex microscopic; Future  Prediabetes -     Basic metabolic panel with GFR; Future -     Hemoglobin A1c; Future  Diastolic dysfunction without heart failure -     Brain natriuretic peptide; Future -     Ambulatory referral to Cardiology -     Brain natriuretic peptide; Future  DOE (dyspnea on exertion) -     Brain natriuretic peptide; Future -     EKG 12-Lead -     Brain natriuretic peptide; Future -     Troponin T, High Sensitivity (hs-TnT); Future  Palpitations -     EKG 12-Lead -     LONG TERM MONITOR (3-14 DAYS); Future  Screening examination for STI -     HIV Antibody (routine testing w rflx); Future -     RPR; Future -     GC/Chlamydia Probe Amp; Future     Follow-up: Return in about 6 months  (around 01/14/2024).  Sandra Crouch, MD

## 2023-07-18 ENCOUNTER — Other Ambulatory Visit (INDEPENDENT_AMBULATORY_CARE_PROVIDER_SITE_OTHER)

## 2023-07-18 DIAGNOSIS — R0609 Other forms of dyspnea: Secondary | ICD-10-CM | POA: Diagnosis not present

## 2023-07-18 DIAGNOSIS — Z113 Encounter for screening for infections with a predominantly sexual mode of transmission: Secondary | ICD-10-CM

## 2023-07-18 DIAGNOSIS — I10 Essential (primary) hypertension: Secondary | ICD-10-CM

## 2023-07-18 DIAGNOSIS — I5189 Other ill-defined heart diseases: Secondary | ICD-10-CM | POA: Diagnosis not present

## 2023-07-18 DIAGNOSIS — R7303 Prediabetes: Secondary | ICD-10-CM

## 2023-07-18 LAB — HEMOGLOBIN A1C: Hgb A1c MFr Bld: 6.1 % (ref 4.6–6.5)

## 2023-07-18 LAB — URINALYSIS, ROUTINE W REFLEX MICROSCOPIC
Bilirubin Urine: NEGATIVE
Hgb urine dipstick: NEGATIVE
Ketones, ur: NEGATIVE
Leukocytes,Ua: NEGATIVE
Nitrite: NEGATIVE
RBC / HPF: NONE SEEN (ref 0–?)
Specific Gravity, Urine: >=1.03 — AB (ref 1.000–1.030)
Total Protein, Urine: NEGATIVE
Urine Glucose: NEGATIVE
Urobilinogen, UA: 0.2 (ref 0.0–1.0)
pH: 6 (ref 5.0–8.0)

## 2023-07-18 LAB — BASIC METABOLIC PANEL WITH GFR
BUN: 12 mg/dL (ref 6–23)
CO2: 26 meq/L (ref 19–32)
Calcium: 9.1 mg/dL (ref 8.4–10.5)
Chloride: 102 meq/L (ref 96–112)
Creatinine, Ser: 0.93 mg/dL (ref 0.40–1.50)
GFR: 83.62 mL/min (ref >60.00)
Glucose, Bld: 107 mg/dL — ABNORMAL HIGH (ref 70–99)
Potassium: 4 meq/L (ref 3.5–5.1)
Sodium: 138 meq/L (ref 135–145)

## 2023-07-18 NOTE — Progress Notes (Unsigned)
 EP to read.

## 2023-07-19 ENCOUNTER — Ambulatory Visit: Admitting: Internal Medicine

## 2023-07-19 DIAGNOSIS — J3081 Allergic rhinitis due to animal (cat) (dog) hair and dander: Secondary | ICD-10-CM | POA: Diagnosis not present

## 2023-07-19 DIAGNOSIS — J301 Allergic rhinitis due to pollen: Secondary | ICD-10-CM | POA: Diagnosis not present

## 2023-07-19 DIAGNOSIS — J3089 Other allergic rhinitis: Secondary | ICD-10-CM | POA: Diagnosis not present

## 2023-07-19 LAB — HIV ANTIBODY (ROUTINE TESTING W REFLEX): HIV 1&2 Ab, 4th Generation: NONREACTIVE

## 2023-07-19 LAB — RPR: RPR Ser Ql: NONREACTIVE

## 2023-07-20 ENCOUNTER — Ambulatory Visit: Payer: Self-pay | Admitting: Internal Medicine

## 2023-07-20 LAB — GC/CHLAMYDIA PROBE AMP
Chlamydia trachomatis, NAA: NEGATIVE
Neisseria Gonorrhoeae by PCR: NEGATIVE

## 2023-07-21 LAB — TROPONIN T, HIGH SENSITIVITY (HS-TNT): Troponin T (Highly Sensitive): 8 ng/L (ref <23)

## 2023-07-21 LAB — BRAIN NATRIURETIC PEPTIDE: Brain Natriuretic Peptide: 13 pg/mL (ref <100)

## 2023-07-26 DIAGNOSIS — J3089 Other allergic rhinitis: Secondary | ICD-10-CM | POA: Diagnosis not present

## 2023-07-26 DIAGNOSIS — J301 Allergic rhinitis due to pollen: Secondary | ICD-10-CM | POA: Diagnosis not present

## 2023-07-26 DIAGNOSIS — J3081 Allergic rhinitis due to animal (cat) (dog) hair and dander: Secondary | ICD-10-CM | POA: Diagnosis not present

## 2023-08-01 ENCOUNTER — Other Ambulatory Visit (HOSPITAL_BASED_OUTPATIENT_CLINIC_OR_DEPARTMENT_OTHER): Payer: Self-pay

## 2023-08-01 ENCOUNTER — Other Ambulatory Visit: Payer: Self-pay

## 2023-08-02 DIAGNOSIS — J3081 Allergic rhinitis due to animal (cat) (dog) hair and dander: Secondary | ICD-10-CM | POA: Diagnosis not present

## 2023-08-02 DIAGNOSIS — G4733 Obstructive sleep apnea (adult) (pediatric): Secondary | ICD-10-CM | POA: Diagnosis not present

## 2023-08-02 DIAGNOSIS — J3089 Other allergic rhinitis: Secondary | ICD-10-CM | POA: Diagnosis not present

## 2023-08-02 DIAGNOSIS — J301 Allergic rhinitis due to pollen: Secondary | ICD-10-CM | POA: Diagnosis not present

## 2023-08-09 DIAGNOSIS — J3089 Other allergic rhinitis: Secondary | ICD-10-CM | POA: Diagnosis not present

## 2023-08-09 DIAGNOSIS — J3081 Allergic rhinitis due to animal (cat) (dog) hair and dander: Secondary | ICD-10-CM | POA: Diagnosis not present

## 2023-08-09 DIAGNOSIS — J301 Allergic rhinitis due to pollen: Secondary | ICD-10-CM | POA: Diagnosis not present

## 2023-08-17 DIAGNOSIS — R002 Palpitations: Secondary | ICD-10-CM | POA: Diagnosis not present

## 2023-08-21 DIAGNOSIS — R002 Palpitations: Secondary | ICD-10-CM

## 2023-08-23 DIAGNOSIS — J301 Allergic rhinitis due to pollen: Secondary | ICD-10-CM | POA: Diagnosis not present

## 2023-08-23 DIAGNOSIS — J3081 Allergic rhinitis due to animal (cat) (dog) hair and dander: Secondary | ICD-10-CM | POA: Diagnosis not present

## 2023-08-23 DIAGNOSIS — J3089 Other allergic rhinitis: Secondary | ICD-10-CM | POA: Diagnosis not present

## 2023-09-01 DIAGNOSIS — G4733 Obstructive sleep apnea (adult) (pediatric): Secondary | ICD-10-CM | POA: Diagnosis not present

## 2023-09-06 ENCOUNTER — Other Ambulatory Visit (HOSPITAL_BASED_OUTPATIENT_CLINIC_OR_DEPARTMENT_OTHER): Payer: Self-pay

## 2023-09-20 DIAGNOSIS — J3089 Other allergic rhinitis: Secondary | ICD-10-CM | POA: Diagnosis not present

## 2023-09-20 DIAGNOSIS — J301 Allergic rhinitis due to pollen: Secondary | ICD-10-CM | POA: Diagnosis not present

## 2023-09-20 DIAGNOSIS — J3081 Allergic rhinitis due to animal (cat) (dog) hair and dander: Secondary | ICD-10-CM | POA: Diagnosis not present

## 2023-09-27 DIAGNOSIS — J3081 Allergic rhinitis due to animal (cat) (dog) hair and dander: Secondary | ICD-10-CM | POA: Diagnosis not present

## 2023-09-27 DIAGNOSIS — J301 Allergic rhinitis due to pollen: Secondary | ICD-10-CM | POA: Diagnosis not present

## 2023-09-27 DIAGNOSIS — J3089 Other allergic rhinitis: Secondary | ICD-10-CM | POA: Diagnosis not present

## 2023-10-02 DIAGNOSIS — G4733 Obstructive sleep apnea (adult) (pediatric): Secondary | ICD-10-CM | POA: Diagnosis not present

## 2023-10-03 DIAGNOSIS — G4733 Obstructive sleep apnea (adult) (pediatric): Secondary | ICD-10-CM | POA: Diagnosis not present

## 2023-10-05 ENCOUNTER — Other Ambulatory Visit (HOSPITAL_BASED_OUTPATIENT_CLINIC_OR_DEPARTMENT_OTHER): Payer: Self-pay

## 2023-10-05 DIAGNOSIS — N529 Male erectile dysfunction, unspecified: Secondary | ICD-10-CM | POA: Diagnosis not present

## 2023-10-05 DIAGNOSIS — J3081 Allergic rhinitis due to animal (cat) (dog) hair and dander: Secondary | ICD-10-CM | POA: Diagnosis not present

## 2023-10-05 DIAGNOSIS — N481 Balanitis: Secondary | ICD-10-CM | POA: Diagnosis not present

## 2023-10-05 DIAGNOSIS — J3089 Other allergic rhinitis: Secondary | ICD-10-CM | POA: Diagnosis not present

## 2023-10-05 DIAGNOSIS — F5232 Male orgasmic disorder: Secondary | ICD-10-CM | POA: Diagnosis not present

## 2023-10-05 DIAGNOSIS — J301 Allergic rhinitis due to pollen: Secondary | ICD-10-CM | POA: Diagnosis not present

## 2023-10-05 MED ORDER — TADALAFIL 20 MG PO TABS
20.0000 mg | ORAL_TABLET | Freq: Every day | ORAL | 11 refills | Status: AC | PRN
  Filled 2023-10-05: qty 20, 20d supply, fill #0

## 2023-10-11 ENCOUNTER — Other Ambulatory Visit (HOSPITAL_BASED_OUTPATIENT_CLINIC_OR_DEPARTMENT_OTHER): Payer: Self-pay

## 2023-10-11 DIAGNOSIS — L720 Epidermal cyst: Secondary | ICD-10-CM | POA: Diagnosis not present

## 2023-10-11 MED ORDER — DOXYCYCLINE HYCLATE 100 MG PO TABS
100.0000 mg | ORAL_TABLET | Freq: Two times a day (BID) | ORAL | 1 refills | Status: DC
  Filled 2023-10-11: qty 28, 14d supply, fill #0
  Filled 2023-10-21 – 2023-10-22 (×2): qty 28, 14d supply, fill #1

## 2023-10-18 ENCOUNTER — Other Ambulatory Visit (HOSPITAL_BASED_OUTPATIENT_CLINIC_OR_DEPARTMENT_OTHER): Payer: Self-pay

## 2023-10-21 ENCOUNTER — Other Ambulatory Visit (HOSPITAL_BASED_OUTPATIENT_CLINIC_OR_DEPARTMENT_OTHER): Payer: Self-pay

## 2023-10-21 DIAGNOSIS — J3081 Allergic rhinitis due to animal (cat) (dog) hair and dander: Secondary | ICD-10-CM | POA: Diagnosis not present

## 2023-10-21 DIAGNOSIS — J301 Allergic rhinitis due to pollen: Secondary | ICD-10-CM | POA: Diagnosis not present

## 2023-10-21 DIAGNOSIS — J3089 Other allergic rhinitis: Secondary | ICD-10-CM | POA: Diagnosis not present

## 2023-10-27 ENCOUNTER — Other Ambulatory Visit: Payer: Self-pay

## 2023-11-01 DIAGNOSIS — Z20828 Contact with and (suspected) exposure to other viral communicable diseases: Secondary | ICD-10-CM | POA: Diagnosis not present

## 2023-11-02 DIAGNOSIS — G4733 Obstructive sleep apnea (adult) (pediatric): Secondary | ICD-10-CM | POA: Diagnosis not present

## 2023-11-08 DIAGNOSIS — J3081 Allergic rhinitis due to animal (cat) (dog) hair and dander: Secondary | ICD-10-CM | POA: Diagnosis not present

## 2023-11-08 DIAGNOSIS — J3089 Other allergic rhinitis: Secondary | ICD-10-CM | POA: Diagnosis not present

## 2023-11-08 DIAGNOSIS — J301 Allergic rhinitis due to pollen: Secondary | ICD-10-CM | POA: Diagnosis not present

## 2023-11-15 ENCOUNTER — Encounter: Payer: Self-pay | Admitting: Internal Medicine

## 2023-11-16 ENCOUNTER — Ambulatory Visit

## 2023-11-16 DIAGNOSIS — Z23 Encounter for immunization: Secondary | ICD-10-CM | POA: Diagnosis not present

## 2023-11-16 NOTE — Progress Notes (Signed)
 Pt was given High dose flu shot with no complications.

## 2023-11-23 ENCOUNTER — Other Ambulatory Visit (HOSPITAL_BASED_OUTPATIENT_CLINIC_OR_DEPARTMENT_OTHER): Payer: Self-pay

## 2023-11-23 MED ORDER — COMIRNATY 30 MCG/0.3ML IM SUSY
0.3000 mL | PREFILLED_SYRINGE | Freq: Once | INTRAMUSCULAR | 0 refills | Status: AC
  Filled 2023-11-23: qty 0.3, 1d supply, fill #0

## 2023-12-02 ENCOUNTER — Other Ambulatory Visit: Payer: Self-pay

## 2023-12-02 DIAGNOSIS — J3081 Allergic rhinitis due to animal (cat) (dog) hair and dander: Secondary | ICD-10-CM | POA: Diagnosis not present

## 2023-12-02 DIAGNOSIS — J301 Allergic rhinitis due to pollen: Secondary | ICD-10-CM | POA: Diagnosis not present

## 2023-12-02 DIAGNOSIS — J3089 Other allergic rhinitis: Secondary | ICD-10-CM | POA: Diagnosis not present

## 2023-12-03 ENCOUNTER — Other Ambulatory Visit (HOSPITAL_BASED_OUTPATIENT_CLINIC_OR_DEPARTMENT_OTHER): Payer: Self-pay

## 2023-12-05 ENCOUNTER — Other Ambulatory Visit (HOSPITAL_BASED_OUTPATIENT_CLINIC_OR_DEPARTMENT_OTHER): Payer: Self-pay

## 2023-12-06 ENCOUNTER — Other Ambulatory Visit (HOSPITAL_BASED_OUTPATIENT_CLINIC_OR_DEPARTMENT_OTHER): Payer: Self-pay

## 2023-12-06 DIAGNOSIS — J301 Allergic rhinitis due to pollen: Secondary | ICD-10-CM | POA: Diagnosis not present

## 2023-12-06 DIAGNOSIS — J3081 Allergic rhinitis due to animal (cat) (dog) hair and dander: Secondary | ICD-10-CM | POA: Diagnosis not present

## 2023-12-06 DIAGNOSIS — J3089 Other allergic rhinitis: Secondary | ICD-10-CM | POA: Diagnosis not present

## 2023-12-06 MED ORDER — EPINEPHRINE 0.3 MG/0.3ML IJ SOAJ
0.3000 mg | INTRAMUSCULAR | 1 refills | Status: AC | PRN
  Filled 2023-12-06: qty 4, 30d supply, fill #0

## 2023-12-09 ENCOUNTER — Encounter

## 2023-12-13 DIAGNOSIS — J3089 Other allergic rhinitis: Secondary | ICD-10-CM | POA: Diagnosis not present

## 2023-12-13 DIAGNOSIS — J301 Allergic rhinitis due to pollen: Secondary | ICD-10-CM | POA: Diagnosis not present

## 2023-12-13 DIAGNOSIS — J3081 Allergic rhinitis due to animal (cat) (dog) hair and dander: Secondary | ICD-10-CM | POA: Diagnosis not present

## 2023-12-20 DIAGNOSIS — J301 Allergic rhinitis due to pollen: Secondary | ICD-10-CM | POA: Diagnosis not present

## 2023-12-20 DIAGNOSIS — J3081 Allergic rhinitis due to animal (cat) (dog) hair and dander: Secondary | ICD-10-CM | POA: Diagnosis not present

## 2023-12-20 DIAGNOSIS — J3089 Other allergic rhinitis: Secondary | ICD-10-CM | POA: Diagnosis not present

## 2023-12-27 DIAGNOSIS — J3081 Allergic rhinitis due to animal (cat) (dog) hair and dander: Secondary | ICD-10-CM | POA: Diagnosis not present

## 2023-12-27 DIAGNOSIS — J301 Allergic rhinitis due to pollen: Secondary | ICD-10-CM | POA: Diagnosis not present

## 2023-12-27 DIAGNOSIS — J3089 Other allergic rhinitis: Secondary | ICD-10-CM | POA: Diagnosis not present

## 2023-12-29 ENCOUNTER — Encounter: Admitting: Internal Medicine

## 2023-12-30 ENCOUNTER — Ambulatory Visit

## 2023-12-30 ENCOUNTER — Other Ambulatory Visit (HOSPITAL_BASED_OUTPATIENT_CLINIC_OR_DEPARTMENT_OTHER): Payer: Self-pay

## 2023-12-30 VITALS — Ht 69.0 in | Wt 190.0 lb

## 2023-12-30 DIAGNOSIS — Z860100 Personal history of colon polyps, unspecified: Secondary | ICD-10-CM

## 2023-12-30 MED ORDER — NA SULFATE-K SULFATE-MG SULF 17.5-3.13-1.6 GM/177ML PO SOLN
1.0000 | Freq: Once | ORAL | 0 refills | Status: AC
  Filled 2023-12-30: qty 354, 1d supply, fill #0

## 2023-12-30 NOTE — Progress Notes (Signed)

## 2024-01-01 DIAGNOSIS — G4733 Obstructive sleep apnea (adult) (pediatric): Secondary | ICD-10-CM | POA: Diagnosis not present

## 2024-01-02 DIAGNOSIS — G4733 Obstructive sleep apnea (adult) (pediatric): Secondary | ICD-10-CM | POA: Diagnosis not present

## 2024-01-03 ENCOUNTER — Other Ambulatory Visit (HOSPITAL_BASED_OUTPATIENT_CLINIC_OR_DEPARTMENT_OTHER): Payer: Self-pay

## 2024-01-03 ENCOUNTER — Encounter: Payer: Self-pay | Admitting: Internal Medicine

## 2024-01-03 ENCOUNTER — Ambulatory Visit: Admitting: Internal Medicine

## 2024-01-03 VITALS — BP 124/82 | Temp 98.5°F

## 2024-01-03 DIAGNOSIS — J4541 Moderate persistent asthma with (acute) exacerbation: Secondary | ICD-10-CM | POA: Diagnosis not present

## 2024-01-03 DIAGNOSIS — J22 Unspecified acute lower respiratory infection: Secondary | ICD-10-CM

## 2024-01-03 DIAGNOSIS — J019 Acute sinusitis, unspecified: Secondary | ICD-10-CM

## 2024-01-03 MED ORDER — METHYLPREDNISOLONE ACETATE 80 MG/ML IJ SUSP
80.0000 mg | Freq: Once | INTRAMUSCULAR | Status: AC
  Administered 2024-01-03: 80 mg via INTRAMUSCULAR

## 2024-01-03 MED ORDER — HYDROCODONE BIT-HOMATROP MBR 5-1.5 MG/5ML PO SOLN
5.0000 mL | Freq: Three times a day (TID) | ORAL | 0 refills | Status: DC | PRN
  Filled 2024-01-03: qty 120, 8d supply, fill #0

## 2024-01-03 MED ORDER — PREDNISONE 20 MG PO TABS
40.0000 mg | ORAL_TABLET | Freq: Every day | ORAL | 0 refills | Status: AC
  Filled 2024-01-03: qty 10, 5d supply, fill #0

## 2024-01-03 MED ORDER — AMOXICILLIN-POT CLAVULANATE 875-125 MG PO TABS
1.0000 | ORAL_TABLET | Freq: Two times a day (BID) | ORAL | 0 refills | Status: AC
  Filled 2024-01-03: qty 20, 10d supply, fill #0

## 2024-01-03 NOTE — Patient Instructions (Addendum)
      You had a steroid injection.      Medications changes include :   prednisone  40 mg daily x 5 days - starting tomorrow.  Augmentin  twice daily for 10 days and cough syrup       Return if symptoms worsen or fail to improve.

## 2024-01-03 NOTE — Progress Notes (Signed)
 Subjective:    Patient ID: Kenneth Sellers, male    DOB: 12/04/1955, 68 y.o.   MRN: [id number]      HPI Kenneth Sellers is here for  Chief Complaint  Patient presents with   Acute Visit    3rd week of having mucus and phlegm with cough.    Discussed the use of AI scribe software for clinical note transcription with the patient, who gave verbal consent to proceed.  History of Present Illness Kenneth Sellers is a 68 year old male with asthma who presents with a persistent cold lasting three weeks.  Kenneth Sellers has been experiencing a cold for three weeks, using three boxes of Tylenol  Cold without relief. Kenneth Sellers also used leftover codeine  cough syrup, which is now depleted. Symptoms include significant mucus drainage, chest congestion, and coughing, which worsens when lying down. No fever, but Kenneth Sellers felt 'chillish' last night. Kenneth Sellers has headaches, likely due to sinus pressure, and lightheadedness during physical activity. Kenneth Sellers reports nasal congestion and sinus pain/pressure.  Kenneth Sellers has a history of bronchitis and sinus infections. Kenneth Sellers has been using his asthma inhalers, which provide some relief, but notes increased shortness of breath and wheezing compared to his baseline. Kenneth Sellers describes mild chest tightness.         Medications and allergies reviewed with patient and updated if appropriate.  Current Outpatient Medications on File Prior to Visit  Medication Sig Dispense Refill   albuterol  (VENTOLIN  HFA) 108 (90 Base) MCG/ACT inhaler Inhale 1-2 puffs into the lungs every 4-6 hours as needed for cough/wheeze. 6.7 g 0   aliskiren  (TEKTURNA ) 150 MG tablet Take 1 tablet (150 mg total) by mouth daily. 90 tablet 1   AMBULATORY NON FORMULARY MEDICATION CPAP machine nightly     azelastine  (ASTELIN ) 0.1 % nasal spray Place 2 sprays into both nostrils 2 (two) times daily. Use in each nostril as directed 30 mL 5   calcium carbonate (OSCAL) 1500 (600 Ca) MG TABS tablet Take by mouth 2 (two) times daily with a meal.      Desoximetasone  0.05 % GEL Apply topically. (Patient not taking: Reported on 12/30/2023)     EPINEPHrine  0.3 mg/0.3 mL IJ SOAJ injection Inject 0.3 mg into the muscle as needed. 60 each 1   ezetimibe -simvastatin  (VYTORIN ) 10-20 MG tablet Take 1 tablet by mouth at bedtime. 90 tablet 1   fluticasone  (FLONASE ) 50 MCG/ACT nasal spray Place 1 - 2 sprays in each nostril once a day 48 g 1   Fluticasone -Umeclidin-Vilant (TRELEGY ELLIPTA ) 200-62.5-25 MCG/ACT AEPB Inhale 1 puff into the lungs daily. 60 each 6   levocetirizine (XYZAL ) 5 MG tablet Take 1 tablet (5 mg total) by mouth daily. 90 tablet 2   montelukast  (SINGULAIR ) 10 MG tablet Take 1 tablet (10 mg total) by mouth daily. 90 tablet 2   Multiple Vitamins-Minerals (MULTIVITAMIN ADULTS PO) Take 1 tablet by mouth daily.      Probiotic Product (RESTORA) CAPS TAKE 1 CAPSULE BY MOUTH DAILY. 90 capsule 1   tadalafil  (CIALIS ) 20 MG tablet Take 1 tablet (20 mg total) by mouth daily as needed. 20 tablet 11   No current facility-administered medications on file prior to visit.    Review of Systems  Constitutional:  Negative for chills and fever.  HENT:  Positive for congestion, postnasal drip, sinus pressure, sinus pain and voice change. Negative for sore throat.   Respiratory:  Positive for cough (productive), chest tightness, shortness of breath and wheezing.   Neurological:  Positive for  light-headedness and headaches (sinus).       Objective:   Vitals:   01/03/24 1533  BP: 124/82  Temp: 98.5 F (36.9 C)   BP Readings from Last 3 Encounters:  01/03/24 124/82  07/15/23 (!) 144/86  06/15/23 130/77   Wt Readings from Last 3 Encounters:  12/30/23 190 lb (86.2 kg)  07/15/23 198 lb 6.4 oz (90 kg)  06/15/23 200 lb (90.7 kg)   There is no height or weight on file to calculate BMI.    Physical Exam Constitutional:      General: Kenneth Sellers is not in acute distress.    Appearance: Normal appearance. Kenneth Sellers is not ill-appearing.  HENT:     Head:  Normocephalic.     Right Ear: Tympanic membrane, ear canal and external ear normal. There is no impacted cerumen.     Left Ear: Tympanic membrane, ear canal and external ear normal. There is no impacted cerumen.     Nose: Congestion present.     Mouth/Throat:     Mouth: Mucous membranes are moist.     Pharynx: No oropharyngeal exudate or posterior oropharyngeal erythema.  Eyes:     Conjunctiva/sclera: Conjunctivae normal.  Cardiovascular:     Rate and Rhythm: Normal rate and regular rhythm.  Pulmonary:     Effort: Pulmonary effort is normal. No respiratory distress.     Breath sounds: Wheezing (Mild, expiratory) present. No rales.  Musculoskeletal:     Cervical back: Neck supple. No tenderness.  Lymphadenopathy:     Cervical: No cervical adenopathy.  Skin:    General: Skin is warm and dry.     Findings: No rash.  Neurological:     Mental Status: Kenneth Sellers is alert.            Assessment & Plan:    See Problem List for Assessment and Plan of chronic medical problems.   Assessment and Plan Assessment & Plan Lower respiratory tract infection, sinus infection Persistent symptoms for three weeks with mucus drainage, chest cough, sinus pressure, nasal congestion, and hoarseness. No fever, but chills present.  With asthma exacerbation - Augmentin  875-125 mg twice daily x 10 days - Hycodan cough syrup for nighttime - Steroids for asthma exacerbation  Asthma exacerbation Increased shortness of breath and wheezing. Inhalers provide some relief. - Depo-Medrol  80 mg IM x 1 - Augmentin  875-125 mg twice daily x 10 days, Hycodan cough syrup for nighttime, prednisone  40 mg daily x 5 days starting tomorrow - Continue current inhaler regimen.  Call or follow-up if symptoms are not improving

## 2024-01-13 ENCOUNTER — Encounter: Payer: Self-pay | Admitting: Internal Medicine

## 2024-01-14 ENCOUNTER — Other Ambulatory Visit (HOSPITAL_BASED_OUTPATIENT_CLINIC_OR_DEPARTMENT_OTHER): Payer: Self-pay

## 2024-01-16 ENCOUNTER — Other Ambulatory Visit (HOSPITAL_BASED_OUTPATIENT_CLINIC_OR_DEPARTMENT_OTHER): Payer: Self-pay

## 2024-01-16 ENCOUNTER — Ambulatory Visit: Payer: Self-pay | Admitting: Internal Medicine

## 2024-01-16 ENCOUNTER — Ambulatory Visit: Admitting: Internal Medicine

## 2024-01-16 ENCOUNTER — Encounter: Payer: Self-pay | Admitting: Internal Medicine

## 2024-01-16 VITALS — BP 134/74 | HR 75 | Temp 98.0°F | Resp 16 | Ht 69.0 in | Wt 183.6 lb

## 2024-01-16 DIAGNOSIS — Z0001 Encounter for general adult medical examination with abnormal findings: Secondary | ICD-10-CM

## 2024-01-16 DIAGNOSIS — Z0000 Encounter for general adult medical examination without abnormal findings: Secondary | ICD-10-CM

## 2024-01-16 DIAGNOSIS — Z125 Encounter for screening for malignant neoplasm of prostate: Secondary | ICD-10-CM

## 2024-01-16 DIAGNOSIS — R7303 Prediabetes: Secondary | ICD-10-CM

## 2024-01-16 DIAGNOSIS — I10 Essential (primary) hypertension: Secondary | ICD-10-CM

## 2024-01-16 DIAGNOSIS — E785 Hyperlipidemia, unspecified: Secondary | ICD-10-CM

## 2024-01-16 LAB — CBC WITH DIFFERENTIAL/PLATELET
Basophils Absolute: 0.1 K/uL (ref 0.0–0.1)
Basophils Relative: 1 % (ref 0.0–3.0)
Eosinophils Absolute: 0.3 K/uL (ref 0.0–0.7)
Eosinophils Relative: 3.1 % (ref 0.0–5.0)
HCT: 46.9 % (ref 39.0–52.0)
Hemoglobin: 15.9 g/dL (ref 13.0–17.0)
Lymphocytes Relative: 13.2 % (ref 12.0–46.0)
Lymphs Abs: 1.1 K/uL (ref 0.7–4.0)
MCHC: 33.9 g/dL (ref 30.0–36.0)
MCV: 91 fl (ref 78.0–100.0)
Monocytes Absolute: 1.3 K/uL — ABNORMAL HIGH (ref 0.1–1.0)
Monocytes Relative: 15.7 % — ABNORMAL HIGH (ref 3.0–12.0)
Neutro Abs: 5.7 K/uL (ref 1.4–7.7)
Neutrophils Relative %: 67 % (ref 43.0–77.0)
Platelets: 218 K/uL (ref 150.0–400.0)
RBC: 5.16 Mil/uL (ref 4.22–5.81)
RDW: 13.8 % (ref 11.5–15.5)
WBC: 8.5 K/uL (ref 4.0–10.5)

## 2024-01-16 LAB — URINALYSIS, ROUTINE W REFLEX MICROSCOPIC
Bilirubin Urine: NEGATIVE
Hgb urine dipstick: NEGATIVE
Ketones, ur: NEGATIVE
Leukocytes,Ua: NEGATIVE
Nitrite: NEGATIVE
RBC / HPF: NONE SEEN (ref 0–?)
Specific Gravity, Urine: 1.025 (ref 1.000–1.030)
Total Protein, Urine: NEGATIVE
Urine Glucose: NEGATIVE
Urobilinogen, UA: 0.2 (ref 0.0–1.0)
pH: 6 (ref 5.0–8.0)

## 2024-01-16 LAB — HEMOGLOBIN A1C: Hgb A1c MFr Bld: 5.9 % (ref 4.6–6.5)

## 2024-01-16 LAB — LIPID PANEL
Cholesterol: 164 mg/dL (ref 0–200)
HDL: 52.6 mg/dL (ref >39.00)
LDL Cholesterol: 85 mg/dL (ref 0–99)
NonHDL: 111.24
Total CHOL/HDL Ratio: 3
Triglycerides: 130 mg/dL (ref 0.0–149.0)
VLDL: 26 mg/dL (ref 0.0–40.0)

## 2024-01-16 LAB — BASIC METABOLIC PANEL WITH GFR
BUN: 14 mg/dL (ref 6–23)
CO2: 29 meq/L (ref 19–32)
Calcium: 9.6 mg/dL (ref 8.4–10.5)
Chloride: 100 meq/L (ref 96–112)
Creatinine, Ser: 0.89 mg/dL (ref 0.40–1.50)
GFR: 86.97 mL/min (ref >60.00)
Glucose, Bld: 105 mg/dL — ABNORMAL HIGH (ref 70–99)
Potassium: 4.1 meq/L (ref 3.5–5.1)
Sodium: 139 meq/L (ref 135–145)

## 2024-01-16 LAB — TSH: TSH: 3.45 u[IU]/mL (ref 0.35–5.50)

## 2024-01-16 LAB — HEPATIC FUNCTION PANEL
ALT: 23 U/L (ref 0–53)
AST: 22 U/L (ref 0–37)
Albumin: 4.5 g/dL (ref 3.5–5.2)
Alkaline Phosphatase: 59 U/L (ref 39–117)
Bilirubin, Direct: 0.1 mg/dL (ref 0.0–0.3)
Total Bilirubin: 0.6 mg/dL (ref 0.2–1.2)
Total Protein: 6.8 g/dL (ref 6.0–8.3)

## 2024-01-16 LAB — PSA: PSA: 0.55 ng/mL (ref 0.10–4.00)

## 2024-01-16 MED ORDER — TRELEGY ELLIPTA 200-62.5-25 MCG/ACT IN AEPB
1.0000 | INHALATION_SPRAY | Freq: Every day | RESPIRATORY_TRACT | 3 refills | Status: AC
  Filled 2024-01-16: qty 60, 30d supply, fill #0

## 2024-01-16 NOTE — Progress Notes (Unsigned)
 Subjective:  Patient ID: Kenneth Sellers, male    DOB: [birth year]  Age: 68 y.o. MRN: [id number]  CC: Annual Exam, Hypertension, Hyperlipidemia, and URI   HPI Kenneth Sellers presents for a CPX and f/up ----  Discussed the use of AI scribe software for clinical note transcription with the patient, who gave verbal consent to proceed.  History of Present Illness Kenneth Sellers is a 68 year old male who presents for an annual physical exam and follow-up of a recent respiratory illness.  He has been experiencing symptoms of a respiratory illness since before Thanksgiving, which he believes he contracted from a fellow cast member during a play. The symptoms began with a persistent cough and significant mucus drainage in his head and chest. These symptoms are now beginning to subside.  He was seen on November 25th and was prescribed an antibiotic and received a steroid shot along with prednisone . Since then, his symptoms have improved, although he continues to experience some cough and mucus production. No fever, chills, or night sweats have been present during the illness. He is concerned about the potential for developing bronchitis.  No chest pain or cardiac symptoms. He feels tired and worn down, attributing this to the illness and his recent activities, including participating in a play. He is fully vaccinated against shingles and pneumonia.  Socially, he recently participated in a play, '[employer]', which was a new experience for him. He turned 68 in August and has been active socially, including attending a dinner party where he was encouraged to audition for the play.     Outpatient Medications Prior to Visit  Medication Sig Dispense Refill   albuterol  (VENTOLIN  HFA) 108 (90 Base) MCG/ACT inhaler Inhale 1-2 puffs into the lungs every 4-6 hours as needed for cough/wheeze. 6.7 g 0   AMBULATORY NON FORMULARY MEDICATION CPAP machine nightly     azelastine  (ASTELIN ) 0.1 % nasal spray Place  2 sprays into both nostrils 2 (two) times daily. Use in each nostril as directed 30 mL 5   calcium carbonate (OSCAL) 1500 (600 Ca) MG TABS tablet Take by mouth 2 (two) times daily with a meal.     EPINEPHrine  0.3 mg/0.3 mL IJ SOAJ injection Inject 0.3 mg into the muscle as needed. 60 each 1   fluticasone  (FLONASE ) 50 MCG/ACT nasal spray Place 1 - 2 sprays in each nostril once a day 48 g 1   Fluticasone -Umeclidin-Vilant (TRELEGY ELLIPTA ) 200-62.5-25 MCG/ACT AEPB Inhale 1 puff into the lungs daily. 60 each 3   levocetirizine (XYZAL ) 5 MG tablet Take 1 tablet (5 mg total) by mouth daily. 90 tablet 2   montelukast  (SINGULAIR ) 10 MG tablet Take 1 tablet (10 mg total) by mouth daily. 90 tablet 2   Multiple Vitamins-Minerals (MULTIVITAMIN ADULTS PO) Take 1 tablet by mouth daily.      Probiotic Product (RESTORA) CAPS TAKE 1 CAPSULE BY MOUTH DAILY. 90 capsule 1   tadalafil  (CIALIS ) 20 MG tablet Take 1 tablet (20 mg total) by mouth daily as needed. 20 tablet 11   aliskiren  (TEKTURNA ) 150 MG tablet Take 1 tablet (150 mg total) by mouth daily. 90 tablet 1   ezetimibe -simvastatin  (VYTORIN ) 10-20 MG tablet Take 1 tablet by mouth at bedtime. 90 tablet 1   HYDROcodone  bit-homatropine (HYCODAN) 5-1.5 MG/5ML syrup Take 5 mLs by mouth every 8 (eight) hours as needed for cough. 120 mL 0   Desoximetasone  0.05 % GEL Apply topically. (Patient not taking: Reported on 12/30/2023)  No facility-administered medications prior to visit.    ROS Review of Systems  Constitutional:  Negative for appetite change, chills, diaphoresis, fatigue and fever.  HENT:  Positive for postnasal drip and rhinorrhea. Negative for congestion, facial swelling, nosebleeds, sinus pressure, sinus pain, sore throat and trouble swallowing.   Respiratory:  Positive for cough. Negative for chest tightness, shortness of breath and wheezing.   Cardiovascular:  Negative for chest pain, palpitations and leg swelling.  Gastrointestinal: Negative.   Negative for abdominal pain, constipation, diarrhea, nausea and vomiting.  Genitourinary: Negative.  Negative for difficulty urinating and dysuria.  Musculoskeletal: Negative.  Negative for arthralgias and myalgias.  Skin: Negative.   Neurological:  Positive for tremors. Negative for dizziness, weakness, light-headedness and headaches.  Hematological:  Negative for adenopathy. Does not bruise/bleed easily.    Objective:  BP 134/74 (BP Location: Left Arm, Patient Position: Sitting, Cuff Size: Normal)   Pulse 75   Temp 98 F (36.7 C) (Oral)   Resp 16   Ht 5' 9 (1.753 m)   Wt 183 lb 9.6 oz (83.3 kg)   SpO2 96%   BMI 27.11 kg/m   BP Readings from Last 3 Encounters:  01/16/24 134/74  01/03/24 124/82  07/15/23 (!) 144/86    Wt Readings from Last 3 Encounters:  01/16/24 183 lb 9.6 oz (83.3 kg)  12/30/23 190 lb (86.2 kg)  07/15/23 198 lb 6.4 oz (90 kg)    Physical Exam Vitals reviewed.  Constitutional:      Appearance: Normal appearance.  HENT:     Nose: Nose normal.     Mouth/Throat:     Mouth: Mucous membranes are moist.  Eyes:     General: No scleral icterus.    Conjunctiva/sclera: Conjunctivae normal.  Cardiovascular:     Rate and Rhythm: Normal rate and regular rhythm.     Heart sounds: No murmur heard.    No friction rub. No gallop.  Pulmonary:     Effort: Pulmonary effort is normal.     Breath sounds: No stridor. No wheezing, rhonchi or rales.  Abdominal:     General: Abdomen is flat. There is no distension.     Palpations: There is no mass.     Tenderness: There is no abdominal tenderness. There is no guarding.     Hernia: No hernia is present. There is no hernia in the left inguinal area or right inguinal area.  Genitourinary:    Pubic Area: No rash.      Penis: Normal.      Testes: Normal.     Epididymis:     Right: Normal.     Left: Normal.     Prostate: Normal. Not enlarged, not tender and no nodules present.     Rectum: Normal. Guaiac result  negative. No mass, tenderness, anal fissure, external hemorrhoid or internal hemorrhoid. Normal anal tone.  Musculoskeletal:        General: Normal range of motion.     Cervical back: Neck supple.     Right lower leg: No edema.     Left lower leg: No edema.  Lymphadenopathy:     Cervical: No cervical adenopathy.     Lower Body: No right inguinal adenopathy. No left inguinal adenopathy.  Skin:    General: Skin is warm and dry.     Coloration: Skin is not pale.     Findings: No rash.  Neurological:     General: No focal deficit present.     Mental Status: He  is alert. Mental status is at baseline.     Cranial Nerves: Cranial nerves 2-12 are intact.     Motor: Tremor present.     Coordination: Coordination is intact.     Gait: Gait is intact.     Deep Tendon Reflexes: Reflexes are normal and symmetric.  Psychiatric:        Mood and Affect: Mood normal.        Behavior: Behavior normal.     Lab Results  Component Value Date   WBC 8.5 01/16/2024   HGB 15.9 01/16/2024   HCT 46.9 01/16/2024   PLT 218.0 01/16/2024   GLUCOSE 105 (H) 01/16/2024   CHOL 164 01/16/2024   TRIG 130.0 01/16/2024   HDL 52.60 01/16/2024   LDLCALC 85 01/16/2024   ALT 23 01/16/2024   AST 22 01/16/2024   NA 139 01/16/2024   K 4.1 01/16/2024   CL 100 01/16/2024   CREATININE 0.89 01/16/2024   BUN 14 01/16/2024   CO2 29 01/16/2024   TSH 3.45 01/16/2024   PSA 0.55 01/16/2024   HGBA1C 5.9 01/16/2024    NM BRAIN DATSCAN  TUMOR LOC INFLAM SPECT 1 DAY Result Date: 03/23/2022 CLINICAL DATA:  68 year old male with longstanding tremor. Increasing intensity. EXAM: NUCLEAR MEDICINE BRAIN IMAGING WITH SPECT  (DaTscan  ) TECHNIQUE: SPECT images of the brain were obtained after intravenous injection of radiopharmaceutical. 4 hour post injection imaging. Appropriate positioning. 130 mg IO STAT given orally for thyroid  blockade. RADIOPHARMACEUTICALS:  4.6 millicuries I 123 Ioflupane COMPARISON:  None Available. FINDINGS:  Normal, shape of the LEFT and RIGHT striata. There is greater relative activity in the LEFT striatum compared to the RIGHT. No focal deficit identified. IMPRESSION: Mild asymmetry of activity within the striata. Findings within normal limits. Of note, DaTSCAN  is not diagnostic of Parkinsonian syndromes, which remains a clinical diagnosis. DaTscan  is an adjuvant test to aid in the clinical diagnosis of Parkinsonian syndromes. Electronically Signed   By: Jackquline Boxer M.D.   On: 03/23/2022 16:12   The 10-year ASCVD risk score (Arnett DK, et al., 2019) is: 17.3%   Values used to calculate the score:     Age: 81 years     Clincally relevant sex: Male     Is Non-Hispanic African American: No     Diabetic: No     Tobacco smoker: No     Systolic Blood Pressure: 134 mmHg     Is BP treated: No     HDL Cholesterol: 52.6 mg/dL     Total Cholesterol: 164 mg/dL   Assessment & Plan:   Hyperlipidemia with target LDL less than 130- LDL goal achieved. Doing well on the statin  -     Lipid panel; Future -     Hepatic function panel; Future -     Ezetimibe -Simvastatin ; Take 1 tablet by mouth at bedtime.  Dispense: 90 tablet; Refill: 1  Encounter for general adult medical examination with abnormal findings- Exam completed, labs reviewed, vaccines reviewed, cancer screenings addressed, pt ed material was given.   Prediabetes -     Hemoglobin A1c; Future -     Basic metabolic panel with GFR; Future  Primary hypertension- BP is well controlled. -     CBC with Differential/Platelet; Future -     TSH; Future -     Urinalysis, Routine w reflex microscopic; Future -     Aliskiren  Fumarate; Take 1 tablet (150 mg total) by mouth daily.  Dispense: 90 tablet; Refill: 1  Screening for  prostate cancer -     PSA; Future     Follow-up: Return in about 6 months (around 07/16/2024).  Debby Molt, MD

## 2024-01-16 NOTE — Patient Instructions (Signed)
 Health Maintenance, Male  Adopting a healthy lifestyle and getting preventive care are important in promoting health and wellness. Ask your health care provider about:  The right schedule for you to have regular tests and exams.  Things you can do on your own to prevent diseases and keep yourself healthy.  What should I know about diet, weight, and exercise?  Eat a healthy diet    Eat a diet that includes plenty of vegetables, fruits, low-fat dairy products, and lean protein.  Do not eat a lot of foods that are high in solid fats, added sugars, or sodium.  Maintain a healthy weight  Body mass index (BMI) is a measurement that can be used to identify possible weight problems. It estimates body fat based on height and weight. Your health care provider can help determine your BMI and help you achieve or maintain a healthy weight.  Get regular exercise  Get regular exercise. This is one of the most important things you can do for your health. Most adults should:  Exercise for at least 150 minutes each week. The exercise should increase your heart rate and make you sweat (moderate-intensity exercise).  Do strengthening exercises at least twice a week. This is in addition to the moderate-intensity exercise.  Spend less time sitting. Even light physical activity can be beneficial.  Watch cholesterol and blood lipids  Have your blood tested for lipids and cholesterol at 70 years of age, then have this test every 5 years.  You may need to have your cholesterol levels checked more often if:  Your lipid or cholesterol levels are high.  You are older than 70 years of age.  You are at high risk for heart disease.  What should I know about cancer screening?  Many types of cancers can be detected early and may often be prevented. Depending on your health history and family history, you may need to have cancer screening at various ages. This may include screening for:  Colorectal cancer.  Prostate cancer.  Skin cancer.  Lung  cancer.  What should I know about heart disease, diabetes, and high blood pressure?  Blood pressure and heart disease  High blood pressure causes heart disease and increases the risk of stroke. This is more likely to develop in people who have high blood pressure readings or are overweight.  Talk with your health care provider about your target blood pressure readings.  Have your blood pressure checked:  Every 3-5 years if you are 24-52 years of age.  Every year if you are 3 years old or older.  If you are between the ages of 60 and 72 and are a current or former smoker, ask your health care provider if you should have a one-time screening for abdominal aortic aneurysm (AAA).  Diabetes  Have regular diabetes screenings. This checks your fasting blood sugar level. Have the screening done:  Once every three years after age 66 if you are at a normal weight and have a low risk for diabetes.  More often and at a younger age if you are overweight or have a high risk for diabetes.  What should I know about preventing infection?  Hepatitis B  If you have a higher risk for hepatitis B, you should be screened for this virus. Talk with your health care provider to find out if you are at risk for hepatitis B infection.  Hepatitis C  Blood testing is recommended for:  Everyone born from [birth year] through 1967.  Anyone  with known risk factors for hepatitis C.  Sexually transmitted infections (STIs)  You should be screened each year for STIs, including gonorrhea and chlamydia, if:  You are sexually active and are younger than 70 years of age.  You are older than 70 years of age and your health care provider tells you that you are at risk for this type of infection.  Your sexual activity has changed since you were last screened, and you are at increased risk for chlamydia or gonorrhea. Ask your health care provider if you are at risk.  Ask your health care provider about whether you are at high risk for HIV. Your health care provider  may recommend a prescription medicine to help prevent HIV infection. If you choose to take medicine to prevent HIV, you should first get tested for HIV. You should then be tested every 3 months for as long as you are taking the medicine.  Follow these instructions at home:  Alcohol use  Do not drink alcohol if your health care provider tells you not to drink.  If you drink alcohol:  Limit how much you have to 0-2 drinks a day.  Know how much alcohol is in your drink. In the [place] one drink equals one 12 oz bottle of beer (355 mL), one 5 oz glass of wine (148 mL), or one 1 oz glass of hard liquor (44 mL).  Lifestyle  Do not use any products that contain nicotine or tobacco. These products include cigarettes, chewing tobacco, and vaping devices, such as e-cigarettes. If you need help quitting, ask your health care provider.  Do not use street drugs.  Do not share needles.  Ask your health care provider for help if you need support or information about quitting drugs.  General instructions  Schedule regular health, dental, and eye exams.  Stay current with your vaccines.  Tell your health care provider if:  You often feel depressed.  You have ever been abused or do not feel safe at home.  Summary  Adopting a healthy lifestyle and getting preventive care are important in promoting health and wellness.  Follow your health care provider's instructions about healthy diet, exercising, and getting tested or screened for diseases.  Follow your health care provider's instructions on monitoring your cholesterol and blood pressure.  This information is not intended to replace advice given to you by your health care provider. Make sure you discuss any questions you have with your health care provider.  Document Revised: 06/16/2020 Document Reviewed: 06/16/2020  Elsevier Patient Education  2024 [employer].

## 2024-01-17 ENCOUNTER — Other Ambulatory Visit: Payer: Self-pay

## 2024-01-17 ENCOUNTER — Other Ambulatory Visit (HOSPITAL_BASED_OUTPATIENT_CLINIC_OR_DEPARTMENT_OTHER): Payer: Self-pay

## 2024-01-17 DIAGNOSIS — J3089 Other allergic rhinitis: Secondary | ICD-10-CM | POA: Diagnosis not present

## 2024-01-17 DIAGNOSIS — J3081 Allergic rhinitis due to animal (cat) (dog) hair and dander: Secondary | ICD-10-CM | POA: Diagnosis not present

## 2024-01-17 DIAGNOSIS — J301 Allergic rhinitis due to pollen: Secondary | ICD-10-CM | POA: Diagnosis not present

## 2024-01-17 MED ORDER — TRELEGY ELLIPTA 200-62.5-25 MCG/ACT IN AEPB
1.0000 | INHALATION_SPRAY | Freq: Every day | RESPIRATORY_TRACT | 1 refills | Status: AC
  Filled 2024-01-17 – 2024-01-28 (×3): qty 180, 90d supply, fill #0

## 2024-01-17 MED ORDER — ALISKIREN FUMARATE 150 MG PO TABS
150.0000 mg | ORAL_TABLET | Freq: Every day | ORAL | 1 refills | Status: AC
  Filled 2024-01-17: qty 90, 90d supply, fill #0

## 2024-01-17 MED ORDER — EZETIMIBE-SIMVASTATIN 10-20 MG PO TABS
1.0000 | ORAL_TABLET | Freq: Every day | ORAL | 1 refills | Status: DC
  Filled 2024-01-17: qty 90, 90d supply, fill #0

## 2024-01-18 ENCOUNTER — Other Ambulatory Visit (HOSPITAL_BASED_OUTPATIENT_CLINIC_OR_DEPARTMENT_OTHER): Payer: Self-pay

## 2024-01-18 MED ORDER — ALBUTEROL SULFATE HFA 108 (90 BASE) MCG/ACT IN AERS
INHALATION_SPRAY | RESPIRATORY_TRACT | 0 refills | Status: AC
  Filled 2024-01-18: qty 6.7, 17d supply, fill #0

## 2024-01-19 ENCOUNTER — Other Ambulatory Visit (HOSPITAL_BASED_OUTPATIENT_CLINIC_OR_DEPARTMENT_OTHER): Payer: Self-pay

## 2024-01-20 ENCOUNTER — Encounter: Payer: Self-pay | Admitting: Internal Medicine

## 2024-01-20 ENCOUNTER — Ambulatory Visit: Admitting: Internal Medicine

## 2024-01-20 VITALS — BP 95/50 | HR 86 | Temp 97.4°F | Resp 18 | Ht 69.0 in | Wt 183.0 lb

## 2024-01-20 DIAGNOSIS — D120 Benign neoplasm of cecum: Secondary | ICD-10-CM | POA: Diagnosis not present

## 2024-01-20 DIAGNOSIS — D122 Benign neoplasm of ascending colon: Secondary | ICD-10-CM

## 2024-01-20 DIAGNOSIS — Z860100 Personal history of colon polyps, unspecified: Secondary | ICD-10-CM

## 2024-01-20 DIAGNOSIS — D123 Benign neoplasm of transverse colon: Secondary | ICD-10-CM

## 2024-01-20 MED ORDER — SODIUM CHLORIDE 0.9 % IV SOLN: 500.0000 mL | Freq: Once | INTRAVENOUS | Status: DC

## 2024-01-20 NOTE — Progress Notes (Signed)
 Called to room to assist during endoscopic procedure.  Patient ID and intended procedure confirmed with present staff. Received instructions for my participation in the procedure from the performing physician.

## 2024-01-20 NOTE — Patient Instructions (Signed)
 Handouts provided about diverticulosis and polyps.  Resume present medications.  Await pathology results.  Repeat colonoscopy is recommended for surveillance.  The colonoscopy date will be determined after pathology results from today's exam become available for review.     YOU HAD AN ENDOSCOPIC PROCEDURE TODAY AT THE [facility]:   Refer to the procedure report that was given to you for any specific questions about what was found during the examination.  If the procedure report does not answer your questions, please call your gastroenterologist to clarify.  If you requested that your care partner not be given the details of your procedure findings, then the procedure report has been included in a sealed envelope for you to review at your convenience later.  YOU SHOULD EXPECT: Some feelings of bloating in the abdomen. Passage of more gas than usual.  Walking can help get rid of the air that was put into your GI tract during the procedure and reduce the bloating. If you had a lower endoscopy (such as a colonoscopy or flexible sigmoidoscopy) you may notice spotting of blood in your stool or on the toilet paper. If you underwent a bowel prep for your procedure, you may not have a normal bowel movement for a few days.  Please Note:  You might notice some irritation and congestion in your nose or some drainage.  This is from the oxygen used during your procedure.  There is no need for concern and it should clear up in a day or so.  SYMPTOMS TO REPORT IMMEDIATELY:  Following lower endoscopy (colonoscopy or flexible sigmoidoscopy):  Excessive amounts of blood in the stool  Significant tenderness or worsening of abdominal pains  Swelling of the abdomen that is new, acute  Fever of 100F or higher   For urgent or emergent issues, a gastroenterologist can be reached at any hour by calling [phone]. Do not use MyChart messaging for urgent concerns.    DIET:  We do recommend a  small meal at first, but then you may proceed to your regular diet.  Drink plenty of fluids but you should avoid alcoholic beverages for 24 hours.  ACTIVITY:  You should plan to take it easy for the rest of today and you should NOT DRIVE or use heavy machinery until tomorrow (because of the sedation medicines used during the test).    FOLLOW UP: Our staff will call the number listed on your records the next business day following your procedure.  We will call around 7:15- 8:00 am to check on you and address any questions or concerns that you may have regarding the information given to you following your procedure. If we do not reach you, we will leave a message.     If any biopsies were taken you will be contacted by phone or by letter within the next 1-3 weeks.  Please call us  at [phone] if you have not heard about the biopsies in 3 weeks.    SIGNATURES/CONFIDENTIALITY: You and/or your care partner have signed paperwork which will be entered into your electronic medical record.  These signatures attest to the fact that that the information above on your After Visit Summary has been reviewed and is understood.  Full responsibility of the confidentiality of this discharge information lies with you and/or your care-partner.

## 2024-01-20 NOTE — Progress Notes (Signed)
VS by MO    Pt's states no medical or surgical changes since previsit or office visit.

## 2024-01-20 NOTE — Progress Notes (Signed)
 Sedate, gd SR, tolerated procedure well, VSS, report to RN

## 2024-01-20 NOTE — Progress Notes (Signed)
 GASTROENTEROLOGY PROCEDURE H&P NOTE   Primary Care Physician: Joshua Debby CROME, MD    Reason for Procedure:  History of adenomatous colonic polyps  Plan:    Colonoscopy  Patient is appropriate for endoscopic procedure(s) in the ambulatory ([facility]) setting.  The nature of the procedure, as well as the risks, benefits, and alternatives were carefully and thoroughly reviewed with the patient. Ample time for discussion and questions allowed.  All questions were answered. The patient understood, was satisfied, and agreed with the plan to proceed.    HPI: Kenneth Sellers is a 68 y.o. male who presents for surveillance colonoscopy.  Medical history as below.  Tolerated the prep.  No recent chest pain or shortness of breath.  No abdominal pain today.  Past Medical History:  Diagnosis Date   Abnormal EKG 01/2023   Allergic rhinitis    Dr. Frutoso   Allergy    Arthritis    hands   Asthma    Cataract    small immature left eye   Diverticulosis of colon    DJD (degenerative joint disease)    lower back   GERD (gastroesophageal reflux disease)    History of kidney stones 2020   x 1 passed on own   Hyperlipidemia    Hypertension    OSA (obstructive sleep apnea)    PSG 08/26/02 AHI 59--CPAP 7 cm   Phimosis 01/14/2021   Pneumonia 1988   treated as OP   Sleep apnea    wears cpap    Tachycardia 2003   Dr. Dominick, negative cardiac work up   Tremor of both hands     Past Surgical History:  Procedure Laterality Date   CIRCUMCISION N/A 01/19/2021   Procedure: CIRCUMCISION ADULT;  Surgeon: Matilda Senior, MD;  Location: [facility];  Service: Urology;  Laterality: N/A;   colon tics  03/2005   [facility]   COLONOSCOPY  03/2005   colonscopy  12/25/2018   EYE SURGERY Right 2020   right ioc lens replacement for cataract   lesion removed L neck; pigmented mole at hairline, non melanoma  07/2003   NISSEN FUNDOPLICATION  1997    Dr Merrilyn; complicated by herniation  post op with retching requiring 2nd surgery   POLYPECTOMY     RHINOPLASTY  1983    Prior to Admission medications  Medication Sig Start Date End Date Taking? Authorizing Provider  albuterol  (VENTOLIN  HFA) 108 (90 Base) MCG/ACT inhaler Inale 1-2 puff Inhalation every 4-6 hours as needed for cough/wheeze 01/18/24  Yes   AMBULATORY NON FORMULARY MEDICATION CPAP machine nightly   Yes [provider]  calcium carbonate (OSCAL) 1500 (600 Ca) MG TABS tablet Take by mouth 2 (two) times daily with a meal.   Yes [provider]  ezetimibe -simvastatin  (VYTORIN ) 10-20 MG tablet Take 1 tablet by mouth at bedtime. 01/17/24 07/15/24 Yes Joshua Debby CROME, MD  Fluticasone -Umeclidin-Vilant (TRELEGY ELLIPTA ) 200-62.5-25 MCG/ACT AEPB Inhale 1 puff into the lungs daily. 01/15/24  Yes   levocetirizine (XYZAL ) 5 MG tablet Take 1 tablet (5 mg total) by mouth daily. 07/05/23  Yes   montelukast  (SINGULAIR ) 10 MG tablet Take 1 tablet (10 mg total) by mouth daily. 07/05/23  Yes   Multiple Vitamins-Minerals (MULTIVITAMIN ADULTS PO) Take 1 tablet by mouth daily.    Yes [provider]  Probiotic Product (RESTORA) CAPS TAKE 1 CAPSULE BY MOUTH DAILY. 12/15/18  Yes Joshua Debby CROME, MD  aliskiren  (TEKTURNA ) 150 MG tablet Take 1 tablet (150  mg total) by mouth daily. 01/17/24 07/15/24  Joshua Debby CROME, MD  azelastine  (ASTELIN ) 0.1 % nasal spray Place 2 sprays into both nostrils 2 (two) times daily. Use in each nostril as directed 11/16/21   Cobb, Comer GAILS, NP  EPINEPHrine  0.3 mg/0.3 mL IJ SOAJ injection Inject 0.3 mg into the muscle as needed. 12/06/23     fluticasone  (FLONASE ) 50 MCG/ACT nasal spray Place 1 - 2 sprays in each nostril once a day 01/28/21     Fluticasone -Umeclidin-Vilant (TRELEGY ELLIPTA ) 200-62.5-25 MCG/ACT AEPB Inhale 1 puff into the lungs daily. Patient not taking: Reported on 01/20/2024 01/17/24     tadalafil  (CIALIS ) 20 MG tablet Take 1 tablet (20 mg total) by mouth daily as needed. 10/05/23        Current Outpatient Medications  Medication Sig Dispense Refill   albuterol  (VENTOLIN  HFA) 108 (90 Base) MCG/ACT inhaler Inale 1-2 puff Inhalation every 4-6 hours as needed for cough/wheeze 6.7 g 0   AMBULATORY NON FORMULARY MEDICATION CPAP machine nightly     calcium carbonate (OSCAL) 1500 (600 Ca) MG TABS tablet Take by mouth 2 (two) times daily with a meal.     ezetimibe -simvastatin  (VYTORIN ) 10-20 MG tablet Take 1 tablet by mouth at bedtime. 90 tablet 1   Fluticasone -Umeclidin-Vilant (TRELEGY ELLIPTA ) 200-62.5-25 MCG/ACT AEPB Inhale 1 puff into the lungs daily. 60 each 3   levocetirizine (XYZAL ) 5 MG tablet Take 1 tablet (5 mg total) by mouth daily. 90 tablet 2   montelukast  (SINGULAIR ) 10 MG tablet Take 1 tablet (10 mg total) by mouth daily. 90 tablet 2   Multiple Vitamins-Minerals (MULTIVITAMIN ADULTS PO) Take 1 tablet by mouth daily.      Probiotic Product (RESTORA) CAPS TAKE 1 CAPSULE BY MOUTH DAILY. 90 capsule 1   aliskiren  (TEKTURNA ) 150 MG tablet Take 1 tablet (150 mg total) by mouth daily. 90 tablet 1   azelastine  (ASTELIN ) 0.1 % nasal spray Place 2 sprays into both nostrils 2 (two) times daily. Use in each nostril as directed 30 mL 5   EPINEPHrine  0.3 mg/0.3 mL IJ SOAJ injection Inject 0.3 mg into the muscle as needed. 60 each 1   fluticasone  (FLONASE ) 50 MCG/ACT nasal spray Place 1 - 2 sprays in each nostril once a day 48 g 1   Fluticasone -Umeclidin-Vilant (TRELEGY ELLIPTA ) 200-62.5-25 MCG/ACT AEPB Inhale 1 puff into the lungs daily. (Patient not taking: Reported on 01/20/2024) 180 each 1   tadalafil  (CIALIS ) 20 MG tablet Take 1 tablet (20 mg total) by mouth daily as needed. 20 tablet 11   Current Facility-Administered Medications  Medication Dose Route Frequency Provider Last Rate Last Admin   0.9 %  sodium chloride  infusion  500 mL Intravenous Once Cassady Stanczak, Gordy HERO, MD        Allergies as of 01/20/2024   (No Known Allergies)    Family History  Problem Relation Age of  Onset   Diabetes Mother    Hypertension Mother    Stroke Mother 57   Heart failure Mother    Heart attack Father 22   CVA Sister    Diabetes Sister    Allergies Sister    CVA Sister    CVA Sister    Arthritis Sister    Hypertension Brother    Heart disease Brother    Diabetes Brother        2 brothers   Heart attack Brother 86       smoker   COPD Brother  died in his 43s   Cancer Neg Hx    Early death Neg Hx    Hyperlipidemia Neg Hx    Kidney disease Neg Hx    Colon cancer Neg Hx    Colon polyps Neg Hx    Esophageal cancer Neg Hx    Rectal cancer Neg Hx    Stomach cancer Neg Hx     Social History   Socioeconomic History   Marital status: Married    Spouse name: Ronal Dragon   Number of children: 2   Years of education: Not on file   Highest education level: Bachelor's degree (e.g., BA, AB, BS)  Occupational History   Occupation: retired    Comment: runner, broadcasting/film/video  Tobacco Use   Smoking status: Never   Smokeless tobacco: Never  Vaping Use   Vaping status: Never Used  Substance and Sexual Activity   Alcohol use: Yes    Alcohol/week: 2.0 standard drinks of alcohol    Types: 2 Cans of beer per week    Comment: occasionally   Drug use: No   Sexual activity: Yes  Other Topics Concern   Not on file  Social History Narrative   Lives with wife-2025   Social Drivers of Health   Tobacco Use: Low Risk (01/20/2024)   Patient History    Smoking Tobacco Use: Never    Smokeless Tobacco Use: Never    Passive Exposure: Not on file  Financial Resource Strain: Low Risk (01/02/2024)   Overall Financial Resource Strain (CARDIA)    Difficulty of Paying Living Expenses: Not very hard  Food Insecurity: No Food Insecurity (01/02/2024)   Epic    Worried About Programme Researcher, Broadcasting/film/video in the Last Year: Never true    Ran Out of Food in the Last Year: Never true  Transportation Needs: No Transportation Needs (01/02/2024)   Epic    Lack of Transportation (Medical): No    Lack of  Transportation (Non-Medical): No  Physical Activity: Sufficiently Active (01/02/2024)   Exercise Vital Sign    Days of Exercise per Week: 5 days    Minutes of Exercise per Session: 30 min  Stress: No Stress Concern Present (01/02/2024)   [employer] of Occupational Health - Occupational Stress Questionnaire    Feeling of Stress: Only a little  Social Connections: Socially Integrated (01/02/2024)   Social Connection and Isolation Panel    Frequency of Communication with Friends and Family: More than three times a week    Frequency of Social Gatherings with Friends and Family: Twice a week    Attends Religious Services: More than 4 times per year    Active Member of [employer] or Organizations: Yes    Attends Banker Meetings: More than 4 times per year    Marital Status: Married  Catering Manager Violence: Not At Risk (03/02/2023)   Humiliation, Afraid, Rape, and Kick questionnaire    Fear of Current or Ex-Partner: No    Emotionally Abused: No    Physically Abused: No    Sexually Abused: No  Depression (PHQ2-9): Low Risk (01/16/2024)   Depression (PHQ2-9)    PHQ-2 Score: 1  Alcohol Screen: Low Risk (01/02/2024)   Alcohol Screen    Last Alcohol Screening Score (AUDIT): 2  Housing: Low Risk (01/02/2024)   Epic    Unable to Pay for Housing in the Last Year: No    Number of Times Moved in the Last Year: 0    Homeless in the Last Year: No  Utilities: Low Risk (03/23/2023)   Received from [facility]   Utilities    In the past 12 months has the electric, gas, oil, or water company threatened to shut off services in your home? : No  Health Literacy: Adequate Health Literacy (03/02/2023)   B1300 Health Literacy    Frequency of need for help with medical instructions: Never    Physical Exam: Vital signs in last 24 hours: @BP  (!) 156/85   Pulse 75   Temp (!) 97.4 F (36.3 C)   Ht 5' 9 (1.753 m)   Wt 183 lb (83 kg)   SpO2 95%   BMI 27.02 kg/m  GEN: NAD EYE:  Sclerae anicteric ENT: MMM CV: Non-tachycardic Pulm: CTA b/l GI: Soft, NT/ND NEURO:  Alert & Oriented x 3   Gordy Starch, MD [facility]  01/20/2024 10:27 AM

## 2024-01-20 NOTE — Op Note (Signed)
 [facility] Patient Name: Kenneth Sellers Procedure Date: 01/20/2024 10:37 AM MRN: [id number] Endoscopist: Gordy CHRISTELLA Starch , MD, [id number] Age: 68 Referring MD:  Date of Birth: 02-24-1955 Gender: Male Account #: [id number] Procedure:                Colonoscopy Indications:              High risk colon cancer surveillance: Personal                            history of multiple adenomas, Last colonoscopy:                            November 2020 (TA x 2), Oct 2017 (TA x 6) Medicines:                Monitored Anesthesia Care Procedure:                Pre-Anesthesia Assessment:                           - Prior to the procedure, a History and Physical                            was performed, and patient medications and                            allergies were reviewed. The patient's tolerance of                            previous anesthesia was also reviewed. The risks                            and benefits of the procedure and the sedation                            options and risks were discussed with the patient.                            All questions were answered, and informed consent                            was obtained. Prior Anticoagulants: The patient has                            taken no anticoagulant or antiplatelet agents. ASA                            Grade Assessment: III - A patient with severe                            systemic disease. After reviewing the risks and                            benefits, the patient was deemed in satisfactory  condition to undergo the procedure.                           After obtaining informed consent, the colonoscope                            was passed under direct vision. Throughout the                            procedure, the patient's blood pressure, pulse, and                            oxygen saturations were monitored continuously. The                            Olympus Scope SN  [id number] was introduced through the                            anus and advanced to the cecum, identified by                            appendiceal orifice and ileocecal valve. The                            colonoscopy was performed without difficulty. The                            patient tolerated the procedure well. The quality                            of the bowel preparation was good after copious                            irrigation and lavage. The ileocecal valve,                            appendiceal orifice, and rectum were photographed. Scope In: 10:42:54 AM Scope Out: 11:02:56 AM Scope Withdrawal Time: 0 hours 16 minutes 22 seconds  Total Procedure Duration: 0 hours 20 minutes 2 seconds  Findings:                 The digital rectal exam was normal.                           Two sessile polyps were found in the cecum. The                            polyps were 3 to 5 mm in size. These polyps were                            removed with a cold snare. Resection and retrieval                            were complete.  Two sessile polyps were found in the ascending                            colon. The polyps were 4 to 6 mm in size. These                            polyps were removed with a cold snare. Resection                            and retrieval were complete.                           A 6 mm polyp was found in the transverse colon. The                            polyp was sessile. The polyp was removed with a                            cold snare. Resection and retrieval were complete.                           Multiple medium-mouthed and small-mouthed                            diverticula were found in the sigmoid colon,                            descending colon and transverse colon.                           The retroflexed view of the distal rectum and anal                            verge was normal and showed no anal or rectal                             abnormalities. Complications:            No immediate complications. Estimated Blood Loss:     Estimated blood loss was minimal. Impression:               - Two 3 to 5 mm polyps in the cecum, removed with a                            cold snare. Resected and retrieved.                           - Two 4 to 6 mm polyps in the ascending colon,                            removed with a cold snare. Resected and retrieved.                           -  One 6 mm polyp in the transverse colon, removed                            with a cold snare. Resected and retrieved.                           - Moderate diverticulosis in the sigmoid colon, in                            the descending colon and in the transverse colon.                           - The distal rectum and anal verge are normal on                            retroflexion view. Recommendation:           - Patient has a contact number available for                            emergencies. The signs and symptoms of potential                            delayed complications were discussed with the                            patient. Return to normal activities tomorrow.                            Written discharge instructions were provided to the                            patient.                           - Resume previous diet.                           - Continue present medications.                           - Await pathology results.                           - Repeat colonoscopy is recommended for                            surveillance. The colonoscopy date will be                            determined after pathology results from today's                            exam become available for review. Gordy CHRISTELLA Starch, MD 01/20/2024 11:06:45 AM This report has been signed electronically.

## 2024-01-23 ENCOUNTER — Telehealth: Payer: Self-pay | Admitting: *Deleted

## 2024-01-23 NOTE — Telephone Encounter (Signed)
 Left message on f/u call

## 2024-01-24 LAB — SURGICAL PATHOLOGY

## 2024-01-25 ENCOUNTER — Ambulatory Visit: Payer: Self-pay | Admitting: Internal Medicine

## 2024-01-26 ENCOUNTER — Inpatient Hospital Stay: Admission: RE | Admit: 2024-01-26 | Discharge: 2024-01-26 | Attending: Internal Medicine | Admitting: Internal Medicine

## 2024-01-26 ENCOUNTER — Other Ambulatory Visit (HOSPITAL_BASED_OUTPATIENT_CLINIC_OR_DEPARTMENT_OTHER): Payer: Self-pay

## 2024-01-26 DIAGNOSIS — I10 Essential (primary) hypertension: Secondary | ICD-10-CM

## 2024-01-26 DIAGNOSIS — E785 Hyperlipidemia, unspecified: Secondary | ICD-10-CM

## 2024-01-28 ENCOUNTER — Other Ambulatory Visit: Payer: Self-pay | Admitting: Internal Medicine

## 2024-01-28 ENCOUNTER — Other Ambulatory Visit (HOSPITAL_BASED_OUTPATIENT_CLINIC_OR_DEPARTMENT_OTHER): Payer: Self-pay

## 2024-01-28 DIAGNOSIS — R931 Abnormal findings on diagnostic imaging of heart and coronary circulation: Secondary | ICD-10-CM | POA: Insufficient documentation

## 2024-01-30 ENCOUNTER — Other Ambulatory Visit: Payer: Self-pay

## 2024-01-30 ENCOUNTER — Other Ambulatory Visit (HOSPITAL_BASED_OUTPATIENT_CLINIC_OR_DEPARTMENT_OTHER): Payer: Self-pay

## 2024-01-30 ENCOUNTER — Ambulatory Visit: Payer: Self-pay | Admitting: Internal Medicine

## 2024-01-30 ENCOUNTER — Encounter: Payer: Self-pay | Admitting: Internal Medicine

## 2024-01-30 ENCOUNTER — Ambulatory Visit

## 2024-01-30 ENCOUNTER — Ambulatory Visit: Admitting: Internal Medicine

## 2024-01-30 VITALS — BP 138/78 | HR 98 | Temp 98.2°F | Resp 16 | Ht 69.0 in | Wt 181.8 lb

## 2024-01-30 DIAGNOSIS — R052 Subacute cough: Secondary | ICD-10-CM

## 2024-01-30 DIAGNOSIS — I10 Essential (primary) hypertension: Secondary | ICD-10-CM | POA: Diagnosis not present

## 2024-01-30 DIAGNOSIS — J4541 Moderate persistent asthma with (acute) exacerbation: Secondary | ICD-10-CM | POA: Diagnosis not present

## 2024-01-30 DIAGNOSIS — J22 Unspecified acute lower respiratory infection: Secondary | ICD-10-CM

## 2024-01-30 MED ORDER — HYDROCODONE BIT-HOMATROP MBR 5-1.5 MG/5ML PO SOLN
5.0000 mL | Freq: Three times a day (TID) | ORAL | 0 refills | Status: AC | PRN
  Filled 2024-01-30: qty 120, 8d supply, fill #0

## 2024-01-30 MED ORDER — METHYLPREDNISOLONE 4 MG PO TBPK
ORAL_TABLET | ORAL | 0 refills | Status: AC
  Filled 2024-01-30: qty 21, 6d supply, fill #0

## 2024-01-30 MED ORDER — AMOXICILLIN-POT CLAVULANATE 875-125 MG PO TABS
1.0000 | ORAL_TABLET | Freq: Two times a day (BID) | ORAL | 0 refills | Status: AC
  Filled 2024-01-30: qty 20, 10d supply, fill #0

## 2024-01-30 NOTE — Patient Instructions (Signed)

## 2024-01-30 NOTE — Progress Notes (Signed)
 "  Subjective:  Patient ID: Kenneth Sellers, male    DOB: 26-Apr-1955  Age: 68 y.o. MRN: [id number]  CC: Sinus Problem (Sinus/nasal congestion, lingering cough)   HPI Kenneth Sellers presents for f/up ---  Discussed the use of AI scribe software for clinical note transcription with the patient, who gave verbal consent to proceed.  History of Present Illness Kenneth Sellers is a 68 year old male who presents with a persistent cough and respiratory symptoms.  He has experienced a cough for approximately four weeks, which is productive with yellow-green sputum. There is no hemoptysis. He also experiences intermittent shortness of breath and wheezing, with a 'rattling' sensation noted last night. There are no associated fever, chills, or night sweats.  He uses Trelegy daily and a rescue inhaler, which provides short-term relief. He has been taking a cough suppressant containing codeine , which he finds effective, but it is currently depleted.  A recent chest CT scan was performed, and the patient was informed that he had a high coronary calcium score. He denies any chest pain related to cardiac issues, attributing it to respiratory problems. He started taking aspirin last night. He is taking asa 81 mg per day.  No fever, chills, night sweats, or hemoptysis. He reports intermittent shortness of breath and wheezing.     Outpatient Medications Prior to Visit  Medication Sig Dispense Refill   albuterol  (VENTOLIN  HFA) 108 (90 Base) MCG/ACT inhaler Inale 1-2 puff Inhalation every 4-6 hours as needed for cough/wheeze 6.7 g 0   aliskiren  (TEKTURNA ) 150 MG tablet Take 1 tablet (150 mg total) by mouth daily. 90 tablet 1   AMBULATORY NON FORMULARY MEDICATION CPAP machine nightly     azelastine  (ASTELIN ) 0.1 % nasal spray Place 2 sprays into both nostrils 2 (two) times daily. Use in each nostril as directed 30 mL 5   calcium carbonate (OSCAL) 1500 (600 Ca) MG TABS tablet Take by mouth 2 (two) times daily with a  meal.     EPINEPHrine  0.3 mg/0.3 mL IJ SOAJ injection Inject 0.3 mg into the muscle as needed. 60 each 1   ezetimibe -simvastatin  (VYTORIN ) 10-20 MG tablet Take 1 tablet by mouth at bedtime. 90 tablet 1   fluticasone  (FLONASE ) 50 MCG/ACT nasal spray Place 1 - 2 sprays in each nostril once a day 48 g 1   Fluticasone -Umeclidin-Vilant (TRELEGY ELLIPTA ) 200-62.5-25 MCG/ACT AEPB Inhale 1 puff into the lungs daily. 60 each 3   levocetirizine (XYZAL ) 5 MG tablet Take 1 tablet (5 mg total) by mouth daily. 90 tablet 2   montelukast  (SINGULAIR ) 10 MG tablet Take 1 tablet (10 mg total) by mouth daily. 90 tablet 2   Multiple Vitamins-Minerals (MULTIVITAMIN ADULTS PO) Take 1 tablet by mouth daily.      Probiotic Product (RESTORA) CAPS TAKE 1 CAPSULE BY MOUTH DAILY. 90 capsule 1   tadalafil  (CIALIS ) 20 MG tablet Take 1 tablet (20 mg total) by mouth daily as needed. 20 tablet 11   Fluticasone -Umeclidin-Vilant (TRELEGY ELLIPTA ) 200-62.5-25 MCG/ACT AEPB Inhale 1 puff into the lungs daily. (Patient not taking: Reported on 01/30/2024) 180 each 1   No facility-administered medications prior to visit.    ROS Review of Systems  Constitutional:  Negative for appetite change, chills, diaphoresis, fatigue and fever.  HENT: Negative.  Negative for sinus pressure, sore throat and trouble swallowing.   Eyes: Negative.   Respiratory:  Positive for cough, shortness of breath and wheezing. Negative for chest tightness and stridor.   Cardiovascular:  Negative for chest pain, palpitations and leg swelling.  Gastrointestinal:  Negative for abdominal pain, constipation, diarrhea, nausea and vomiting.  Endocrine: Negative.   Genitourinary:  Negative for difficulty urinating.  Musculoskeletal: Negative.   Skin: Negative.   Allergic/Immunologic: Negative.   Neurological: Negative.  Negative for dizziness and weakness.  Hematological:  Negative for adenopathy. Does not bruise/bleed easily.  Psychiatric/Behavioral: Negative.       Objective:  BP 138/78 (BP Location: Left Arm, Patient Position: Sitting, Cuff Size: Normal)   Pulse 98   Temp 98.2 F (36.8 C) (Oral)   Resp 16   Ht 5' 9 (1.753 m)   Wt 181 lb 12.8 oz (82.5 kg)   SpO2 96%   BMI 26.85 kg/m   BP Readings from Last 3 Encounters:  01/30/24 138/78  01/20/24 (!) 95/50  01/16/24 134/74    Wt Readings from Last 3 Encounters:  01/30/24 181 lb 12.8 oz (82.5 kg)  01/20/24 183 lb (83 kg)  01/16/24 183 lb 9.6 oz (83.3 kg)    Physical Exam Vitals reviewed.  Constitutional:      Appearance: Normal appearance.  HENT:     Nose: Nose normal.     Mouth/Throat:     Mouth: Mucous membranes are moist.  Eyes:     General: No scleral icterus.    Conjunctiva/sclera: Conjunctivae normal.  Cardiovascular:     Rate and Rhythm: Normal rate and regular rhythm.     Heart sounds: No murmur heard.    No friction rub. No gallop.  Pulmonary:     Effort: Pulmonary effort is normal.     Breath sounds: No stridor. No wheezing, rhonchi or rales.  Abdominal:     General: Abdomen is flat.     Palpations: There is no mass.     Tenderness: There is no abdominal tenderness. There is no guarding.     Hernia: No hernia is present.  Musculoskeletal:        General: Normal range of motion.     Cervical back: Neck supple.     Right lower leg: No edema.     Left lower leg: No edema.  Lymphadenopathy:     Cervical: No cervical adenopathy.  Skin:    General: Skin is warm and dry.  Neurological:     General: No focal deficit present.     Mental Status: He is alert.  Psychiatric:        Mood and Affect: Mood normal.        Behavior: Behavior normal.     Lab Results  Component Value Date   WBC 8.5 01/16/2024   HGB 15.9 01/16/2024   HCT 46.9 01/16/2024   PLT 218.0 01/16/2024   GLUCOSE 105 (H) 01/16/2024   CHOL 164 01/16/2024   TRIG 130.0 01/16/2024   HDL 52.60 01/16/2024   LDLCALC 85 01/16/2024   ALT 23 01/16/2024   AST 22 01/16/2024   NA 139 01/16/2024    K 4.1 01/16/2024   CL 100 01/16/2024   CREATININE 0.89 01/16/2024   BUN 14 01/16/2024   CO2 29 01/16/2024   TSH 3.45 01/16/2024   PSA 0.55 01/16/2024   HGBA1C 5.9 01/16/2024    CT CARDIAC SCORING ([facility] LOCATIONS ONLY) Result Date: 01/27/2024 EXAM: CT Calcium Score 01/26/2024 10:44:29 AM TECHNIQUE: CT of the heart was obtained without intravenous contrast. Automated exposure control, iterative reconstruction, and/or weight based adjustment of the mA/kV was utilized to reduce the radiation dose to as low as reasonably achievable. COMPARISON:  None available CLINICAL HISTORY: CAD screening, high CAD risk, not treadmill candidate. * Tracking Code: FCC * FINDINGS: CALCIUM SCORES: Right Coronary (RCA) 101 Left Main (LM) 0 Left Anterior Descending (LAD) 455 Circumflex (Cx) 59.8 Posterior Descending (PDA) 0 Total Agatston Score 615 Percentile based on patient's age and gender is 66. EXTRACARDIAC STRUCTURES: No pleural effusion. Subpleural right middle lobe 4 mm pulmonary nodule on image 27/7 can be presumed a benign subpleural lymph node and does not warrant specific follow up per Fleischner criteria. Left lower lobe interstitial thickening is likely the sequelae of prior infection or inflammation including on image 3/7. Aortic atherosclerosis. Normal heart size without pericardial effusion. No imaged thoracic adenopathy. Surgical clips in the left upper quadrant. Thoracic spondylosis. IMPRESSION: 1. Total Agatston calcium score of 615 (77th percentile) indicates an increased risk of coronary artery disease. 2. Aortic atherosclerosis. Electronically signed by: Rockey Kilts MD 01/27/2024 05:10 PM EST RP Workstation: [id number]   DG Chest 2 View Result Date: 01/30/2024 CLINICAL DATA:  Cough and congestion x1 month. EXAM: CHEST - 2 VIEW COMPARISON:  May 02, 2020 FINDINGS: The heart size and mediastinal contours are within normal limits. No acute infiltrate, pleural effusion or pneumothorax is identified.  Radiopaque surgical clips are seen within the left upper quadrant. The visualized skeletal structures are unremarkable. IMPRESSION: No active cardiopulmonary disease. Electronically Signed   By: Suzen Dials M.D.   On: 01/30/2024 16:04     Assessment & Plan:  Subacute cough -     DG Chest 2 View; Future -     HYDROcodone  Bit-Homatrop MBr; Take 5 mLs by mouth every 8 (eight) hours as needed for up to 8 days for cough.  Dispense: 120 mL; Refill: 0  Moderate persistent asthma with exacerbation -     methylPREDNISolone ; TAKE AS DIRECTED  Dispense: 21 tablet; Refill: 0  LRTI (lower respiratory tract infection) -     Amoxicillin -Pot Clavulanate; Take 1 tablet by mouth 2 (two) times daily for 10 days.  Dispense: 20 tablet; Refill: 0 -     HYDROcodone  Bit-Homatrop MBr; Take 5 mLs by mouth every 8 (eight) hours as needed for up to 8 days for cough.  Dispense: 120 mL; Refill: 0  Primary hypertension- BP is well controlled.     Follow-up: Return if symptoms worsen or fail to improve.  Debby Molt, MD "

## 2024-01-31 NOTE — Telephone Encounter (Signed)
 Happy to see him as a new patient. Will add our schedulers to get him on for an appt. Thx

## 2024-02-14 ENCOUNTER — Other Ambulatory Visit (HOSPITAL_BASED_OUTPATIENT_CLINIC_OR_DEPARTMENT_OTHER): Payer: Self-pay

## 2024-02-14 ENCOUNTER — Ambulatory Visit: Admitting: Cardiology

## 2024-02-14 VITALS — BP 130/70 | HR 101 | Ht 69.0 in | Wt 181.0 lb

## 2024-02-14 DIAGNOSIS — I2510 Atherosclerotic heart disease of native coronary artery without angina pectoris: Secondary | ICD-10-CM

## 2024-02-14 DIAGNOSIS — Z79899 Other long term (current) drug therapy: Secondary | ICD-10-CM | POA: Diagnosis not present

## 2024-02-14 DIAGNOSIS — R931 Abnormal findings on diagnostic imaging of heart and coronary circulation: Secondary | ICD-10-CM

## 2024-02-14 DIAGNOSIS — R0602 Shortness of breath: Secondary | ICD-10-CM

## 2024-02-14 MED ORDER — EZETIMIBE 10 MG PO TABS
10.0000 mg | ORAL_TABLET | Freq: Every day | ORAL | 3 refills | Status: AC
  Filled 2024-02-14: qty 90, 90d supply, fill #0

## 2024-02-14 MED ORDER — ROSUVASTATIN CALCIUM 20 MG PO TABS
20.0000 mg | ORAL_TABLET | Freq: Every day | ORAL | 3 refills | Status: AC
  Filled 2024-02-14: qty 90, 90d supply, fill #0

## 2024-02-14 MED ORDER — METOPROLOL TARTRATE 100 MG PO TABS
100.0000 mg | ORAL_TABLET | ORAL | 0 refills | Status: AC
  Filled 2024-02-14: qty 1, 1d supply, fill #0

## 2024-02-14 NOTE — Patient Instructions (Signed)
 Medication Instructions:  Please discontinue your Vytorin . Start Crestor  20 mg and Zetia  10 mg daily. Continue all other medications as listed.  *If you need a refill on your cardiac medications before your next appointment, please call your pharmacy*  Lab Work: Please have blood work in 3 months (Lipid, LPa) at your closest [facility].  You do not need and appointment.  If you have labs (blood work) drawn today and your tests are completely normal, you will receive your results only by: MyChart Message (if you have MyChart) OR A paper copy in the mail If you have any lab test that is abnormal or we need to change your treatment, we will call you to review the results.  Testing/Procedures:   Your cardiac CT will be scheduled at:  [facility] [address]  [place], [place] [address]  Please enter the parking lot using the [address] entrance and use the FREE valet service at the patient drop-off area. Enter the building and check-in with registration on the main floor.  Please follow these instructions carefully (unless otherwise directed):  An IV will be required for this test and Nitroglycerin will be given.  Hold all erectile dysfunction medications at least 3 days (72 hrs) prior to test. (Ie viagra , cialis , sildenafil , tadalafil , etc)   On the Night Before the Test: Be sure to Drink plenty of water. Do not consume any caffeinated/decaffeinated beverages or chocolate 12 hours prior to your test. Do not take any antihistamines 12 hours prior to your test.  On the Day of the Test: Drink plenty of water until 1 hour prior to the test. Do not eat any food 1 hour prior to test. You may take your regular medications prior to the test.  Take metoprolol  (Lopressor ) two hours prior to test. If you take Furosemide/Hydrochlorothiazide/Spironolactone/Chlorthalidone , please HOLD on the morning of the test. Patients who wear a continuous glucose monitor  MUST remove the device prior to scanning.  After the Test: Drink plenty of water. After receiving IV contrast, you may experience a mild flushed feeling. This is normal. On occasion, you may experience a mild rash up to 24 hours after the test. This is not dangerous. If this occurs, you can take Benadryl 25 mg, Zyrtec, Claritin, or Allegra and increase your fluid intake. (Patients taking Tikosyn should avoid Benadryl, and may take Zyrtec, Claritin, or Allegra) If you experience trouble breathing, this can be serious. If it is severe call 911 IMMEDIATELY. If it is mild, please call our office.  We will call to schedule your test 2-4 weeks out understanding that some insurance companies will need an authorization prior to the service being performed.   For more information and frequently asked questions, please visit our website : [email or web address]  For non-scheduling related questions, please contact the cardiac imaging nurse navigator should you have any questions/concerns: Cardiac Imaging Nurse Navigators Direct Office Dial: [phone]   For scheduling needs, including cancellations and rescheduling, please call Brittany, [phone].  Follow-Up: At [facility], you and your health needs are our priority.  As part of our continuing mission to provide you with exceptional heart care, our providers are all part of one team.  This team includes your primary Cardiologist (physician) and Advanced Practice Providers or APPs (Physician Assistants and Nurse Practitioners) who all work together to provide you with the care you need, when you need it.  Your next appointment:   Follow up will be based on results of the  above testing.  We recommend signing up for the patient portal called MyChart.  Sign up information is provided on this After Visit Summary.  MyChart is used to connect with patients for Virtual Visits (Telemedicine).  Patients are able to view lab/test  results, encounter notes, upcoming appointments, etc.  Non-urgent messages can be sent to your provider as well.   To learn more about what you can do with MyChart, go to [email or web address].

## 2024-02-14 NOTE — Progress Notes (Unsigned)
 " Cardiology Office Note:  .   Date:  02/14/2024  ID:  ROBINSON Sellers, DOB Oct 28, 1955, MRN [id number] PCP: Kenneth Debby LITTIE, MD  [facility] Providers Cardiologist:  None    History of Present Illness: .   Kenneth Sellers is a 69 y.o. male Discussed the use of AI scribe   History of Present Illness Kenneth Sellers is a 69 year old male with coronary artery disease who presents for evaluation of a high coronary calcium  score. He was referred by Dr. Joshua for evaluation of a high coronary calcium  score.  He has a high coronary calcium  score of 615, placing him in the 77th percentile, with scores of 101 in the right coronary artery, 0 in the left main, 455 in the LAD, and 60 in the circumflex. He denies chest pain or shortness of breath out of the ordinary, but reports a persistent cough and congestion since November, which worsens when lying flat. A stress test in 2018 was low risk with no ischemia noted.  He has a history of arrhythmia approximately 20 years ago, which required hospitalization and a stress test at that time. No significant heart issues have occurred since then. He recalls having some heart-related x-rays done at [facility] last January or February, but cannot remember the specifics.  He has a persistent cough and congestion since November, which worsens when lying flat. He has undergone two rounds of antibiotics and steroids without resolution. He is susceptible to bronchitis, experiencing it at least once a year, and has a history of asthma.  He has been on high cholesterol medication since his thirties, initially due to a cholesterol level of 350. He currently takes Vytorin , a combination of simvastatin  and Zetia , every night. His LDL cholesterol is currently 85, which is significantly lower than his initial levels.  He has a family history of heart disease; his father died of a heart attack at 87, and a middle brother also had a heart attack. He is the youngest of eight  siblings.  He has been taking a low-dose aspirin daily.      Studies Reviewed: .        Results Labs LDL: 85  Radiology Coronary calcium  score (01/26/2024): Total score 615, 77th percentile; right coronary artery 101, left main 0, left anterior descending 455, circumflex 60  Diagnostic Echocardiogram (02/28/2023): Ejection fraction 70%, grade 1 diastolic dysfunction, no significant valvular abnormalities Stress test (2018): Low risk, duration 11 minutes 3 seconds, no ischemia Risk Assessment/Calculations:            Physical Exam:   VS:  BP 130/70 (BP Location: Right Arm, Patient Position: Sitting, Cuff Size: Normal)   Pulse (!) 101   Ht 5' 9 (1.753 m)   Wt 181 lb (82.1 kg)   SpO2 96%   BMI 26.73 kg/m    Wt Readings from Last 3 Encounters:  02/14/24 181 lb (82.1 kg)  01/30/24 181 lb 12.8 oz (82.5 kg)  01/20/24 183 lb (83 kg)    GEN: Well nourished, well developed in no acute distress NECK: No JVD; No carotid bruits CARDIAC: RRR, no murmurs, no rubs, no gallops RESPIRATORY:  Clear to auscultation without rales, wheezing or rhonchi  ABDOMEN: Soft, non-tender, non-distended EXTREMITIES:  No edema; No deformity   ASSESSMENT AND PLAN: .    Assessment and Plan Assessment & Plan Coronary artery disease with elevated coronary calcium  score Coronary calcium  score of 615, placing him in the 77th percentile, indicating significant coronary  artery disease. No current symptoms of chest pain or dyspnea. Family history of heart disease. Long-standing hypercholesterolemia managed with Vytorin . LDL cholesterol is 85, with a target of less than 70. Discussed the role of cholesterol medications in stabilizing plaque and the potential benefits of switching to a more potent statin. Discussed the use of low-dose aspirin for cardiovascular protection, emphasizing the importance of taking it with food to prevent gastrointestinal bleeding. Consideration of a coronary CT scan to assess for  significant stenosis behind the calcium  deposits. - Switched from Vytorin  to rosuvastatin  20 mg and Zetia  to achieve LDL cholesterol goal of less than 70.-Check lipid panel in 3 months and LP(a) - Continue low-dose aspirin, take with breakfast to protect the stomach. - Ordered coronary CT scan to assess for significant stenosis behind calcium  deposits.  Familial hypercholesterolemia Long-standing hypercholesterolemia with a family history suggestive of familial hypercholesterolemia. Previous total cholesterol was 350, now reduced to 85 with Vytorin . Discussed the genetic component and the challenges of managing cholesterol levels despite dietary efforts. - Switched from Vytorin  to rosuvastatin  20 mg and Zetia  to achieve LDL cholesterol goal of less than 70.  Left ventricular diastolic dysfunction, grade 1 Echocardiogram shows an ejection fraction of 70% with grade 1 diastolic dysfunction. No significant valvular abnormalities. No current symptoms suggestive of heart failure. - Continue current management and monitor for symptoms of heart failure.  Asthma and recurrent bronchitis Recurrent bronchitis and asthma with recent exacerbation of cough and congestion since November, unresponsive to two rounds of antibiotics and steroids. Symptoms worsen when supine. History of bronchitis at least once a year. - Continue to monitor respiratory symptoms and manage exacerbations as needed.  Echocardiogram approxi-1 year ago showed normal ejection fraction.         Dispo: We will follow-up with results of testing  Signed, Kenneth Parchment, MD  "

## 2024-02-22 ENCOUNTER — Encounter (HOSPITAL_COMMUNITY): Payer: Self-pay

## 2024-02-24 ENCOUNTER — Ambulatory Visit (HOSPITAL_COMMUNITY)
Admission: RE | Admit: 2024-02-24 | Discharge: 2024-02-24 | Disposition: A | Discharge status: Home / Self Care | Source: Ambulatory Visit | Attending: Cardiology | Admitting: Cardiology

## 2024-02-24 DIAGNOSIS — R0602 Shortness of breath: Secondary | ICD-10-CM | POA: Insufficient documentation

## 2024-02-24 DIAGNOSIS — I2510 Atherosclerotic heart disease of native coronary artery without angina pectoris: Secondary | ICD-10-CM | POA: Insufficient documentation

## 2024-02-24 MED ORDER — NITROGLYCERIN 0.4 MG SL SUBL
0.8000 mg | SUBLINGUAL_TABLET | Freq: Once | SUBLINGUAL | Status: AC
  Administered 2024-02-24: 0.8 mg via SUBLINGUAL

## 2024-02-24 MED ORDER — IOHEXOL 350 MG/ML SOLN
100.0000 mL | Freq: Once | INTRAVENOUS | Status: AC | PRN
  Administered 2024-02-24: 100 mL via INTRAVENOUS

## 2024-02-25 ENCOUNTER — Other Ambulatory Visit (HOSPITAL_BASED_OUTPATIENT_CLINIC_OR_DEPARTMENT_OTHER): Payer: Self-pay

## 2024-02-26 ENCOUNTER — Ambulatory Visit (HOSPITAL_COMMUNITY)
Admission: RE | Admit: 2024-02-26 | Discharge: 2024-02-26 | Disposition: A | Discharge status: Home / Self Care | Source: Ambulatory Visit | Attending: Cardiology | Admitting: Cardiology

## 2024-02-26 ENCOUNTER — Other Ambulatory Visit: Payer: Self-pay | Admitting: Cardiology

## 2024-02-26 DIAGNOSIS — I2510 Atherosclerotic heart disease of native coronary artery without angina pectoris: Secondary | ICD-10-CM

## 2024-02-26 DIAGNOSIS — R931 Abnormal findings on diagnostic imaging of heart and coronary circulation: Secondary | ICD-10-CM | POA: Insufficient documentation

## 2024-02-26 DIAGNOSIS — R0602 Shortness of breath: Secondary | ICD-10-CM

## 2024-02-27 ENCOUNTER — Ambulatory Visit: Payer: Self-pay | Admitting: Cardiology

## 2024-03-02 ENCOUNTER — Ambulatory Visit

## 2024-03-02 VITALS — BP 126/80 | HR 83 | Ht 68.75 in | Wt 183.2 lb

## 2024-03-02 DIAGNOSIS — Z0000 Encounter for general adult medical examination without abnormal findings: Secondary | ICD-10-CM

## 2024-03-02 NOTE — Patient Instructions (Addendum)
 [name],  Thank you for taking the time for your Medicare Wellness Visit. I appreciate your continued commitment to your health goals. Please review the care plan we discussed, and feel free to reach out if I can assist you further.  Please note that Annual Wellness Visits do not include a physical exam. Some assessments may be limited, especially if the visit was conducted virtually. If needed, we may recommend an in-person follow-up with your provider.  Ongoing Care Seeing your primary care provider every 3 to 6 months helps us  monitor your health and provide consistent, personalized care. Last office visit 01/30/2024.  Keep up the good work.  Referrals If a referral was made during today's visit and you haven't received any updates within two weeks, please contact the referred provider directly to check on the status.  Recommended Screenings:  Health Maintenance  Topic Date Due   Medicare Annual Wellness Visit  03/01/2024   COVID-19 Vaccine (8 - Pfizer risk 2025-26 season) 05/23/2024   DTaP/Tdap/Td vaccine (2 - Td or Tdap) 09/15/2024   Colon Cancer Screening  01/19/2029   Pneumococcal Vaccine for age over 65  Completed   Flu Shot  Completed   Hepatitis C Screening  Completed   Zoster (Shingles) Vaccine  Completed   Meningitis B Vaccine  Aged Out       02/27/2024   11:42 AM  Advanced Directives  Does Patient Have a Medical Advance Directive? No    Vision: Annual vision screenings are recommended for early detection of glaucoma, cataracts, and diabetic retinopathy. These exams can also reveal signs of chronic conditions such as diabetes and high blood pressure.  Dental: Annual dental screenings help detect early signs of oral cancer, gum disease, and other conditions linked to overall health, including heart disease and diabetes.  Please see the attached documents for additional preventive care recommendations.

## 2024-03-02 NOTE — Progress Notes (Signed)
 "  Chief Complaint  Patient presents with   Medicare Wellness     Subjective:   Kenneth Sellers is a 69 y.o. male who presents for a Medicare Annual Wellness Visit.  Visit info / Clinical Intake: Medicare Wellness Visit Type:: Subsequent Annual Wellness Visit Persons participating in visit and providing information:: patient Medicare Wellness Visit Mode:: In-person (required for WTM) Interpreter Needed?: No Pre-visit prep was completed: yes AWV questionnaire completed by patient prior to visit?: yes Date:: 02/27/24 Living arrangements:: (Patient-Rptd) lives with spouse/significant other Patient's Overall Health Status Rating: (Patient-Rptd) good Typical amount of pain: (Patient-Rptd) some Does pain affect daily life?: (Patient-Rptd) no Are you currently prescribed opioids?: no  Dietary Habits and Nutritional Risks How many meals a day?: (Patient-Rptd) 3 Eats fruit and vegetables daily?: (Patient-Rptd) yes Most meals are obtained by: (Patient-Rptd) having others provide food In the last 2 weeks, have you had any of the following?: none Diabetic:: no  Functional Status Activities of Daily Living (to include ambulation/medication): (Patient-Rptd) Independent Ambulation: Independent with device- listed below Home Assistive Devices/Equipment: CPAP Medication Administration: (Patient-Rptd) Independent Home Management (perform basic housework or laundry): (Patient-Rptd) Independent Manage your own finances?: (Patient-Rptd) yes Primary transportation is: (Patient-Rptd) driving Concerns about vision?: no *vision screening is required for WTM* Concerns about hearing?: no  Fall Screening Falls in the past year?: (Patient-Rptd) 0 Number of falls in past year: 0 Was there an injury with Fall?: 0 Fall Risk Category Calculator: 0 Patient Fall Risk Level: Low Fall Risk  Fall Risk Patient at Risk for Falls Due to: No Fall Risks Fall risk Follow up: Falls evaluation completed; Falls  prevention discussed  Home and Transportation Safety: All rugs have non-skid backing?: (Patient-Rptd) yes All stairs or steps have railings?: (!) (Patient-Rptd) no Grab bars in the bathtub or shower?: (Patient-Rptd) yes Have non-skid surface in bathtub or shower?: (Patient-Rptd) yes Good home lighting?: (Patient-Rptd) yes Regular seat belt use?: (Patient-Rptd) yes Hospital stays in the last year:: (Patient-Rptd) no  Cognitive Assessment Difficulty concentrating, remembering, or making decisions? : (Patient-Rptd) no Will 6CIT or Mini Cog be Completed: no 6CIT or Mini Cog Declined: patient alert, oriented, able to answer questions appropriately and recall recent events  Advance Directives (For Healthcare) Does Patient Have a Medical Advance Directive?: No (has a will)  Reviewed/Updated  Reviewed/Updated: Reviewed All (Medical, Surgical, Family, Medications, Allergies, Care Teams, Patient Goals)    Allergies (verified) Patient has no known allergies.   Current Medications (verified) Outpatient Encounter Medications as of 03/02/2024  Medication Sig   albuterol  (VENTOLIN  HFA) 108 (90 Base) MCG/ACT inhaler Inale 1-2 puff Inhalation every 4-6 hours as needed for cough/wheeze   aliskiren  (TEKTURNA ) 150 MG tablet Take 1 tablet (150 mg total) by mouth daily.   AMBULATORY NON FORMULARY MEDICATION CPAP machine nightly   azelastine  (ASTELIN ) 0.1 % nasal spray Place 2 sprays into both nostrils 2 (two) times daily. Use in each nostril as directed   calcium  carbonate (OSCAL) 1500 (600 Ca) MG TABS tablet Take by mouth 2 (two) times daily with a meal.   EPINEPHrine  0.3 mg/0.3 mL IJ SOAJ injection Inject 0.3 mg into the muscle as needed.   ezetimibe  (ZETIA ) 10 MG tablet Take 1 tablet (10 mg total) by mouth daily.   fluticasone  (FLONASE ) 50 MCG/ACT nasal spray Place 1 - 2 sprays in each nostril once a day   Fluticasone -Umeclidin-Vilant (TRELEGY ELLIPTA ) 200-62.5-25 MCG/ACT AEPB Inhale 1 puff into  the lungs daily.   Fluticasone -Umeclidin-Vilant (TRELEGY ELLIPTA ) 200-62.5-25 MCG/ACT AEPB Inhale  1 puff into the lungs daily.   levocetirizine (XYZAL ) 5 MG tablet Take 1 tablet (5 mg total) by mouth daily.   metoprolol  tartrate (LOPRESSOR ) 100 MG tablet Take 1 tablet (100 mg total) by mouth as directed. Take one tablet 2 hours before your CT scan   montelukast  (SINGULAIR ) 10 MG tablet Take 1 tablet (10 mg total) by mouth daily.   Multiple Vitamins-Minerals (MULTIVITAMIN ADULTS PO) Take 1 tablet by mouth daily.    Probiotic Product (RESTORA) CAPS TAKE 1 CAPSULE BY MOUTH DAILY.   rosuvastatin  (CRESTOR ) 20 MG tablet Take 1 tablet (20 mg total) by mouth daily.   tadalafil  (CIALIS ) 20 MG tablet Take 1 tablet (20 mg total) by mouth daily as needed.   No facility-administered encounter medications on file as of 03/02/2024.    History: Past Medical History:  Diagnosis Date   Abnormal EKG 01/2023   Allergic rhinitis    Dr. Frutoso   Allergy    Anxiety 03/2022   Arthritis 02/2019   hands   Asthma    Cataract March 2019   small immature left eye   COPD (chronic obstructive pulmonary disease) (HCC)    Diverticulosis of colon    DJD (degenerative joint disease)    lower back   GERD (gastroesophageal reflux disease)    History of kidney stones 2020   x 1 passed on own   Hyperlipidemia    Hypertension    Neuromuscular disorder (HCC) 11/2021   OSA (obstructive sleep apnea)    PSG 08/26/02 AHI 59--CPAP 7 cm   Phimosis 01/14/2021   Pneumonia 1988   treated as OP   Sleep apnea    wears cpap    Tachycardia 2003   Dr. Dominick, negative cardiac work up   Tremor of both hands    Past Surgical History:  Procedure Laterality Date   CIRCUMCISION N/A 01/19/2021   Procedure: CIRCUMCISION ADULT;  Surgeon: Matilda Senior, MD;  Location: [facility];  Service: Urology;  Laterality: N/A;   colon tics  03/2005    GI   COLONOSCOPY  03/2005   colonscopy  12/25/2018   EYE  SURGERY Right cataract surgery 11/09/2017   right ioc lens replacement for cataract   lesion removed L neck; pigmented mole at hairline, non melanoma  07/2003   NISSEN FUNDOPLICATION  1997    Dr Merrilyn; complicated by herniation post op with retching requiring 2nd surgery   POLYPECTOMY     RHINOPLASTY  1983   Family History  Problem Relation Age of Onset   Diabetes Mother    Hypertension Mother    Stroke Mother 54   Heart failure Mother    Asthma Mother    Varicose Veins Mother    Heart attack Father 32   Heart disease Father    Varicose Veins Father    CVA Sister    Diabetes Sister    Allergies Sister    CVA Sister    Diabetes Sister    Stroke Sister    Asthma Sister    CVA Sister    Arthritis Sister    Stroke Sister    Hypertension Brother    Heart disease Brother    Asthma Brother    Varicose Veins Brother    Diabetes Brother        2 brothers   Hypertension Brother    Asthma Brother    Varicose Veins Brother    Heart attack Brother 85       smoker  COPD Brother        died in his 31s   Heart disease Brother    Heart disease Brother    Stroke Sister    Cancer Neg Hx    Early death Neg Hx    Hyperlipidemia Neg Hx    Kidney disease Neg Hx    Colon cancer Neg Hx    Colon polyps Neg Hx    Esophageal cancer Neg Hx    Rectal cancer Neg Hx    Stomach cancer Neg Hx    Social History   Occupational History   Occupation: retired    Comment: runner, broadcasting/film/video  Tobacco Use   Smoking status: Never   Smokeless tobacco: Never  Vaping Use   Vaping status: Never Used  Substance and Sexual Activity   Alcohol use: Yes    Alcohol/week: 2.0 standard drinks of alcohol    Types: 2 Cans of beer per week    Comment: occasionally   Drug use: No   Sexual activity: Yes   Tobacco Counseling Counseling given: Not Answered  SDOH Screenings   Food Insecurity: No Food Insecurity (03/02/2024)  Housing: Low Risk (03/02/2024)  Transportation Needs: No Transportation Needs  (03/02/2024)  Utilities: Not At Risk (03/02/2024)  Alcohol Screen: Low Risk (01/02/2024)  Depression (PHQ2-9): Low Risk (03/02/2024)  Financial Resource Strain: Low Risk (01/02/2024)  Physical Activity: Inactive (03/02/2024)  Social Connections: Socially Integrated (03/02/2024)  Stress: No Stress Concern Present (03/02/2024)  Tobacco Use: Low Risk (03/02/2024)  Health Literacy: Adequate Health Literacy (03/02/2024)   See flowsheets for full screening details  Depression Screen PHQ 2 & 9 Depression Scale- Over the past 2 weeks, how often have you been bothered by any of the following problems? Little interest or pleasure in doing things: 0 Feeling down, depressed, or hopeless (PHQ Adolescent also includes...irritable): 0 PHQ-2 Total Score: 0 Trouble falling or staying asleep, or sleeping too much: 3 (falling asleep) Feeling tired or having little energy: 0 Poor appetite or overeating (PHQ Adolescent also includes...weight loss): 0 Feeling bad about yourself - or that you are a failure or have let yourself or your family down: 0 Trouble concentrating on things, such as reading the newspaper or watching television (PHQ Adolescent also includes...like school work): 0 Moving or speaking so slowly that other people could have noticed. Or the opposite - being so fidgety or restless that you have been moving around a lot more than usual: 0 Thoughts that you would be better off dead, or of hurting yourself in some way: 0 PHQ-9 Total Score: 3 If you checked off any problems, how difficult have these problems made it for you to do your work, take care of things at home, or get along with other people?: Not difficult at all     Goals Addressed               This Visit's Progress     Patient Stated (pt-stated)        Continue to exercise/2026             Objective:    Today's Vitals   03/02/24 1000  BP: 126/80  Pulse: 83  SpO2: 98%  Weight: 183 lb 3.2 oz (83.1 kg)  Height: 5' 8.75  (1.746 m)   Body mass index is 27.25 kg/m.  Hearing/Vision screen Hearing Screening - Comments:: Denies hearing difficulties   Vision Screening - Comments:: Denies vision issues./UTD/ Dr. Waylan  Immunizations and Health Maintenance Health Maintenance  Topic Date Due  COVID-19 Vaccine (8 - Pfizer risk 2025-26 season) 05/23/2024   DTaP/Tdap/Td (2 - Td or Tdap) 09/15/2024   Medicare Annual Wellness (AWV)  03/02/2025   Colonoscopy  01/19/2029   Pneumococcal Vaccine: 50+ Years  Completed   Influenza Vaccine  Completed   Hepatitis C Screening  Completed   Zoster Vaccines- Shingrix  Completed   Meningococcal B Vaccine  Aged Out        Assessment/Plan:  This is a routine wellness examination for Anubis.  Patient Care Team: Joshua Debby CROME, MD as PCP - General (Internal Medicine) Waylan Cain, MD as Consulting Physician (Ophthalmology)  I have personally reviewed and noted the following in the patients chart:   Medical and social history Use of alcohol, tobacco or illicit drugs  Current medications and supplements including opioid prescriptions. Functional ability and status Nutritional status Physical activity Advanced directives List of other physicians Hospitalizations, surgeries, and ER visits in previous 12 months Vitals Screenings to include cognitive, depression, and falls Referrals and appointments  No orders of the defined types were placed in this encounter.  In addition, I have reviewed and discussed with patient certain preventive protocols, quality metrics, and best practice recommendations. A written personalized care plan for preventive services as well as general preventive health recommendations were provided to patient.   Gokul Waybright L Davelyn Gwinn, CMA   03/02/2024   Return in 1 year (on 03/02/2025).  After Visit Summary: (MyChart) Due to this being a telephonic visit, the after visit summary with patients personalized plan was offered to patient via MyChart    Nurse Notes: Patient stated that he still has a persistent cough.  Having coughing spells everyday, coughing up phlegm, (light yellow greenish) and thick at times.  He also wanted to know if he should continue take his OTC calcium  carbonate.  Please discuss with patient during his next office visit or send a Mychart message.  He had no other concerns to address today. "

## 2024-03-15 ENCOUNTER — Other Ambulatory Visit (HOSPITAL_BASED_OUTPATIENT_CLINIC_OR_DEPARTMENT_OTHER): Payer: Self-pay

## 2024-03-15 MED ORDER — TADALAFIL 20 MG PO TABS
20.0000 mg | ORAL_TABLET | ORAL | 11 refills | Status: AC
  Filled 2024-03-15: qty 30, 30d supply, fill #0

## 2024-03-16 ENCOUNTER — Other Ambulatory Visit (HOSPITAL_COMMUNITY): Payer: Self-pay

## 2025-01-21 ENCOUNTER — Encounter: Admitting: Internal Medicine
